# Patient Record
Sex: Female | Born: 1935 | Race: White | Hispanic: No | Marital: Married | Smoking: Never smoker
Health system: Southern US, Community
[De-identification: ages and names within clinical notes are randomized; demographics above are authoritative.]

## PROBLEM LIST (undated history)

## (undated) DIAGNOSIS — E669 Obesity, unspecified: Secondary | ICD-10-CM

## (undated) DIAGNOSIS — N200 Calculus of kidney: Secondary | ICD-10-CM

## (undated) DIAGNOSIS — E785 Hyperlipidemia, unspecified: Secondary | ICD-10-CM

## (undated) DIAGNOSIS — K5790 Diverticulosis of intestine, part unspecified, without perforation or abscess without bleeding: Secondary | ICD-10-CM

## (undated) DIAGNOSIS — I2510 Atherosclerotic heart disease of native coronary artery without angina pectoris: Secondary | ICD-10-CM

## (undated) DIAGNOSIS — K439 Ventral hernia without obstruction or gangrene: Secondary | ICD-10-CM

## (undated) DIAGNOSIS — K635 Polyp of colon: Secondary | ICD-10-CM

## (undated) DIAGNOSIS — H5440 Blindness, one eye, unspecified eye: Secondary | ICD-10-CM

## (undated) DIAGNOSIS — K279 Peptic ulcer, site unspecified, unspecified as acute or chronic, without hemorrhage or perforation: Secondary | ICD-10-CM

## (undated) DIAGNOSIS — E119 Type 2 diabetes mellitus without complications: Secondary | ICD-10-CM

## (undated) DIAGNOSIS — F0390 Unspecified dementia without behavioral disturbance: Secondary | ICD-10-CM

## (undated) DIAGNOSIS — N183 Chronic kidney disease, stage 3 unspecified: Secondary | ICD-10-CM

## (undated) DIAGNOSIS — I4819 Other persistent atrial fibrillation: Secondary | ICD-10-CM

## (undated) DIAGNOSIS — I77810 Thoracic aortic ectasia: Secondary | ICD-10-CM

## (undated) DIAGNOSIS — I10 Essential (primary) hypertension: Secondary | ICD-10-CM

## (undated) DIAGNOSIS — R296 Repeated falls: Secondary | ICD-10-CM

## (undated) DIAGNOSIS — I350 Nonrheumatic aortic (valve) stenosis: Secondary | ICD-10-CM

## (undated) HISTORY — DX: Blindness, one eye, unspecified eye: H54.40

## (undated) HISTORY — DX: Ventral hernia without obstruction or gangrene: K43.9

## (undated) HISTORY — DX: Peptic ulcer, site unspecified, unspecified as acute or chronic, without hemorrhage or perforation: K27.9

## (undated) HISTORY — DX: Hyperlipidemia, unspecified: E78.5

## (undated) HISTORY — PX: TONSILLECTOMY: SUR1361

## (undated) HISTORY — DX: Thoracic aortic ectasia: I77.810

## (undated) HISTORY — PX: APPENDECTOMY: SHX54

## (undated) HISTORY — DX: Diverticulosis of intestine, part unspecified, without perforation or abscess without bleeding: K57.90

## (undated) HISTORY — DX: Type 2 diabetes mellitus without complications: E11.9

## (undated) HISTORY — DX: Polyp of colon: K63.5

## (undated) HISTORY — DX: Repeated falls: R29.6

## (undated) HISTORY — DX: Atherosclerotic heart disease of native coronary artery without angina pectoris: I25.10

## (undated) HISTORY — DX: Essential (primary) hypertension: I10

## (undated) HISTORY — PX: TOTAL ABDOMINAL HYSTERECTOMY: SHX209

## (undated) HISTORY — DX: Calculus of kidney: N20.0

## (undated) HISTORY — DX: Obesity, unspecified: E66.9

---

## 1999-01-29 ENCOUNTER — Other Ambulatory Visit: Admission: RE | Admit: 1999-01-29 | Discharge: 1999-01-29 | Payer: Self-pay | Admitting: Gastroenterology

## 1999-06-07 ENCOUNTER — Other Ambulatory Visit: Admission: RE | Admit: 1999-06-07 | Discharge: 1999-06-07 | Payer: Self-pay | Admitting: Urology

## 1999-12-05 ENCOUNTER — Encounter: Payer: Self-pay | Admitting: Cardiology

## 1999-12-05 ENCOUNTER — Inpatient Hospital Stay (HOSPITAL_COMMUNITY): Admission: EM | Admit: 1999-12-05 | Discharge: 1999-12-07 | Payer: Self-pay | Admitting: Emergency Medicine

## 1999-12-06 ENCOUNTER — Encounter: Payer: Self-pay | Admitting: Cardiology

## 2000-07-01 ENCOUNTER — Encounter: Payer: Self-pay | Admitting: Orthopedic Surgery

## 2000-07-01 ENCOUNTER — Encounter: Admission: RE | Admit: 2000-07-01 | Discharge: 2000-07-01 | Payer: Self-pay | Admitting: Orthopedic Surgery

## 2000-07-17 ENCOUNTER — Ambulatory Visit (HOSPITAL_COMMUNITY): Admission: RE | Admit: 2000-07-17 | Discharge: 2000-07-17 | Payer: Self-pay

## 2001-08-07 ENCOUNTER — Emergency Department (HOSPITAL_COMMUNITY): Admission: EM | Admit: 2001-08-07 | Discharge: 2001-08-07 | Payer: Self-pay | Admitting: Emergency Medicine

## 2002-04-15 ENCOUNTER — Ambulatory Visit (HOSPITAL_COMMUNITY): Admission: RE | Admit: 2002-04-15 | Discharge: 2002-04-15 | Payer: Self-pay | Admitting: Cardiology

## 2003-02-10 ENCOUNTER — Ambulatory Visit (HOSPITAL_BASED_OUTPATIENT_CLINIC_OR_DEPARTMENT_OTHER): Admission: RE | Admit: 2003-02-10 | Discharge: 2003-02-10 | Payer: Self-pay | Admitting: Urology

## 2003-02-10 ENCOUNTER — Encounter (INDEPENDENT_AMBULATORY_CARE_PROVIDER_SITE_OTHER): Payer: Self-pay | Admitting: *Deleted

## 2003-07-07 ENCOUNTER — Emergency Department (HOSPITAL_COMMUNITY): Admission: EM | Admit: 2003-07-07 | Discharge: 2003-07-07 | Payer: Self-pay

## 2003-10-26 ENCOUNTER — Inpatient Hospital Stay (HOSPITAL_COMMUNITY): Admission: RE | Admit: 2003-10-26 | Discharge: 2003-10-30 | Payer: Self-pay | Admitting: Neurosurgery

## 2003-11-26 ENCOUNTER — Emergency Department (HOSPITAL_COMMUNITY): Admission: EM | Admit: 2003-11-26 | Discharge: 2003-11-26 | Payer: Self-pay | Admitting: Emergency Medicine

## 2003-11-27 ENCOUNTER — Ambulatory Visit: Admission: RE | Admit: 2003-11-27 | Discharge: 2003-11-27 | Payer: Self-pay | Admitting: Neurosurgery

## 2003-11-27 ENCOUNTER — Inpatient Hospital Stay (HOSPITAL_COMMUNITY): Admission: AD | Admit: 2003-11-27 | Discharge: 2003-11-30 | Payer: Self-pay | Admitting: Neurosurgery

## 2003-12-03 ENCOUNTER — Emergency Department (HOSPITAL_COMMUNITY): Admission: EM | Admit: 2003-12-03 | Discharge: 2003-12-03 | Payer: Self-pay | Admitting: Emergency Medicine

## 2004-07-16 ENCOUNTER — Ambulatory Visit (HOSPITAL_COMMUNITY): Admission: RE | Admit: 2004-07-16 | Discharge: 2004-07-16 | Payer: Self-pay | Admitting: Orthopedic Surgery

## 2004-08-02 IMAGING — CT CT ABDOMEN W/ CM
1 series · 14 of 32 positions shown, 18 images · IV contrast (omnipaque)
Comparison: none

CLINICAL DATA: 76-year-old female with right groin pain, bilateral lower extremity edema.  There is the concern also for ileofemoral DVT.  
CT ABDOMEN AND PELVIS WITH ORAL AND IV CONTRAST
TECHNIQUE: 5 mm collimation was utilized to scan the abdomen and pelvis extending through the proximal lower extremities following the administration 100 cc Omnipaque 300 contrast.  
No comparisons.  
CT ABDOMEN WITH CONTRAST
Minor left lower lobe atelectasis.  No pericardial or pleural fluid.  Degenerative osteophytes are evident of the thoracic spine.  In the abdomen there is a punctate calculus in the liver involving the medial segment left hepatic lobe, image 20 most consistent with granulomatous disease.  No other hepatic abnormality.  No intrahepatic biliary dilatation.  The gallbladder is moderately distended.  The bile ducts are normal.  The pancreas is thin and atrophic with fatty replacement related to aging.  The spleen has a punctate calculus as well from granulomatous disease in the inferior aspect.  Additionally within the spleen there is ill-defined low attenuation extending from the splenic hilum which is nonspecific in appearance and may represent ill-defined splenic cyst formation.    Ultrasound of the spleen may be helpful.  The adrenal glands, stomach, esophagus, and small bowel are unremarkable.  No evidence of bowel destruction, lymphadenopathy, free air, or ascites.  The kidneys demonstrate mild cortical atrophy with central sinus lipomatosis bilaterally.  No hydronephrosis.  Postoperative changes are present of the lower lumbar spine.  The opacified IVC demonstrates no evidence of intraluminal filling defect or thrombus.
IMPRESSION
Left lower lobe atelectasis.  
Hepatic and splenic granulomata.  
Atrophic pancreas.  
Ill-defined splenic hilar low density abnormalities of uncertain etiology.  Ultrasound may be helpful.  
Patent IVC without evidence of intraluminal clot.
CT PELVIS WITH CONTRAST
Continuing into the pelvis, the visualized ileofemoral venous system bilaterally demonstrates enhancement without evidence of DVT.  No ascites, bowel obstruction, or lymphadenopathy.  The patient has had a hysterectomy.  Sigmoid diverticulosis is evident without inflammation.  Midline staples are noted in the rectus musculature.  Postoperative changes are present of the spine from recent lumbar surgery.
No evidence of ileofemoral DVT by CT.
Sigmoid diverticulosis.  
Status-post hysterectomy.
Postoperative changes.

[Series 2: routine abdomen · axial · 0.87mm/px · z∈[-659,-69]mm · 14 of 132 slices shown, 18 images]
[im 9/132  soft-tissue]
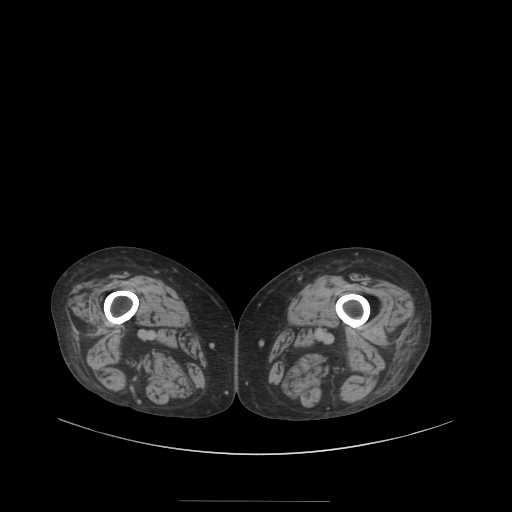
[im 9/132  bone]
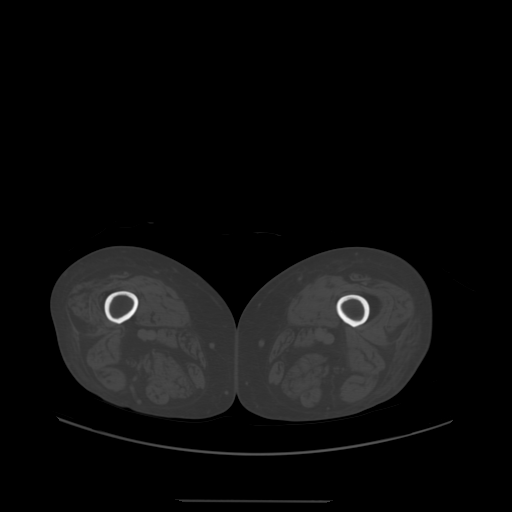
[im 17/132  soft-tissue]
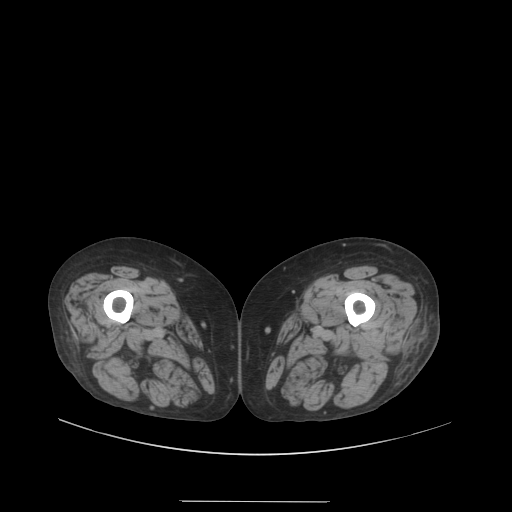
[im 30/132  soft-tissue]
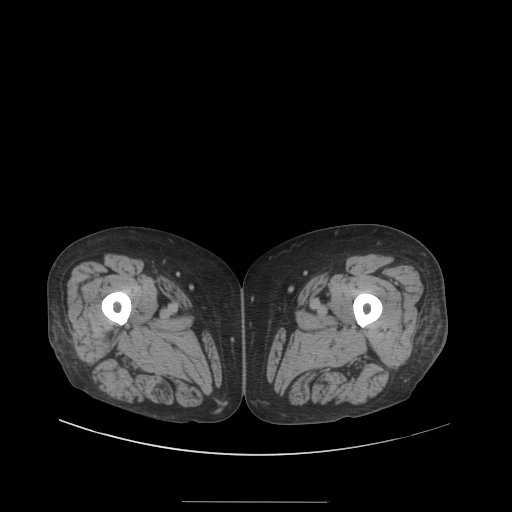
[im 39/132  soft-tissue]
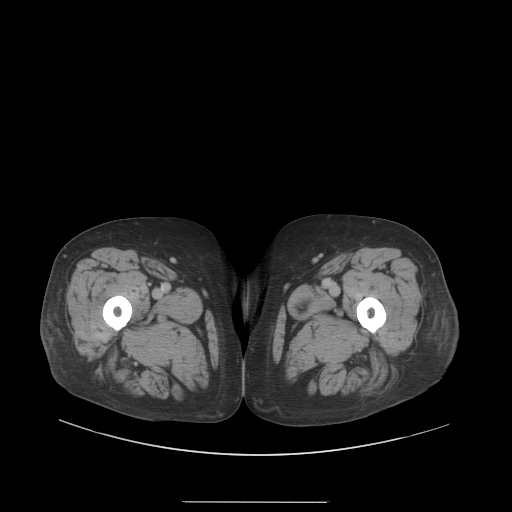
[im 51/132  soft-tissue]
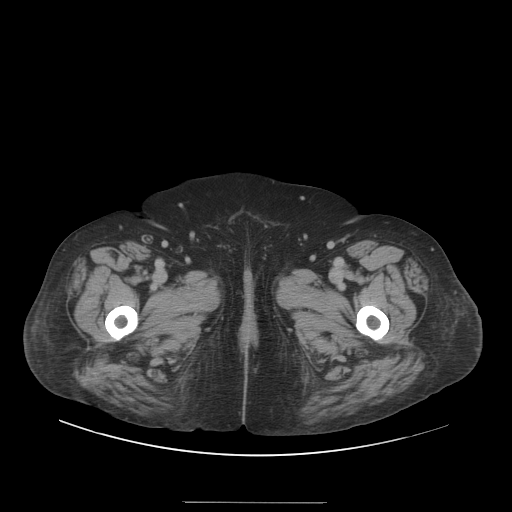
[im 60/132  soft-tissue]
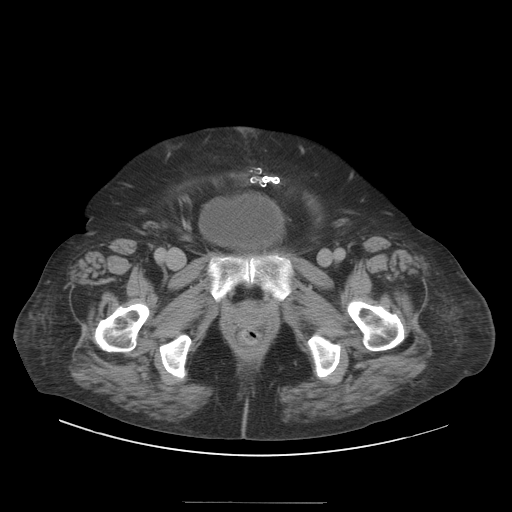
[im 72/132  soft-tissue]
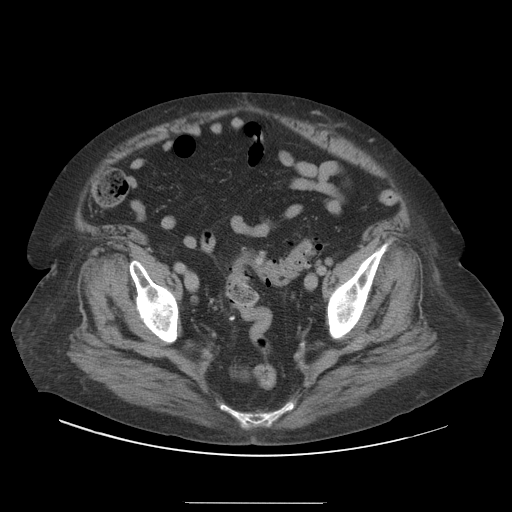
[im 81/132  soft-tissue]
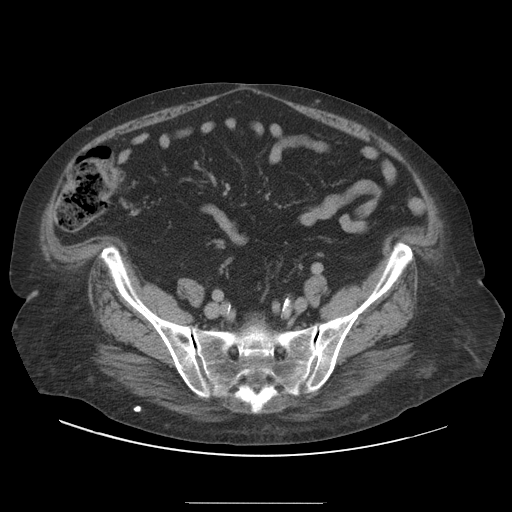
[im 93/132  soft-tissue]
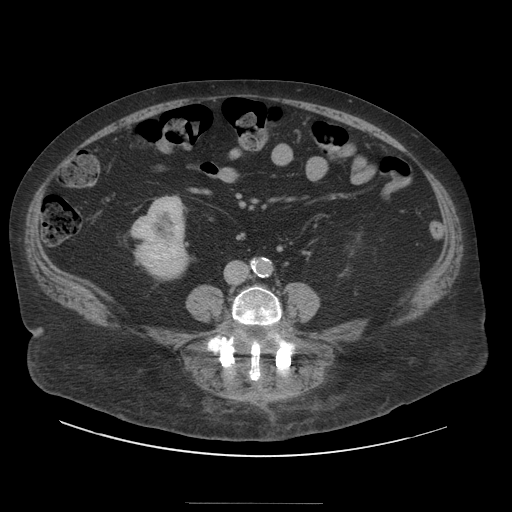
[im 93/132  bone]
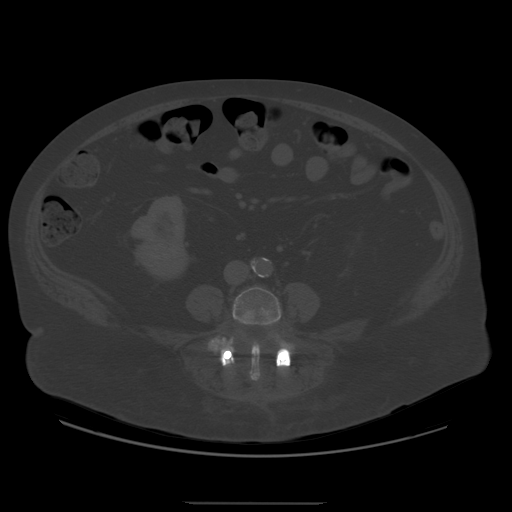
[im 102/132  soft-tissue]
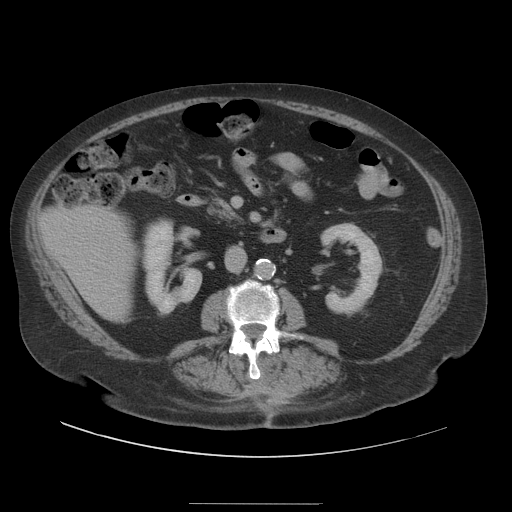
[im 115/132  soft-tissue]
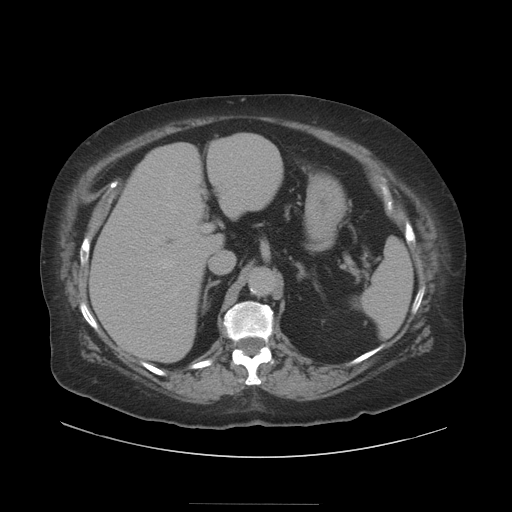
[im 115/132  lung]
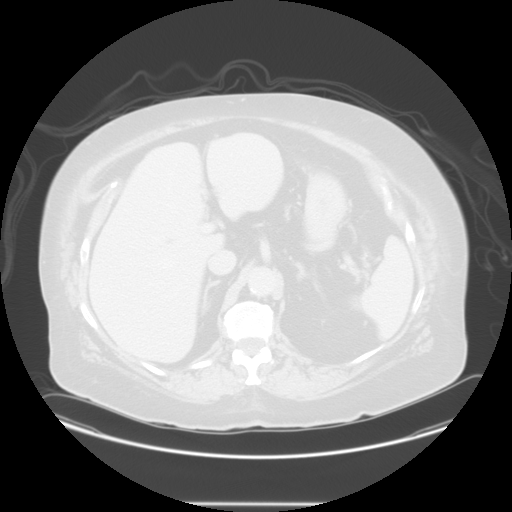
[im 119/132  lung]
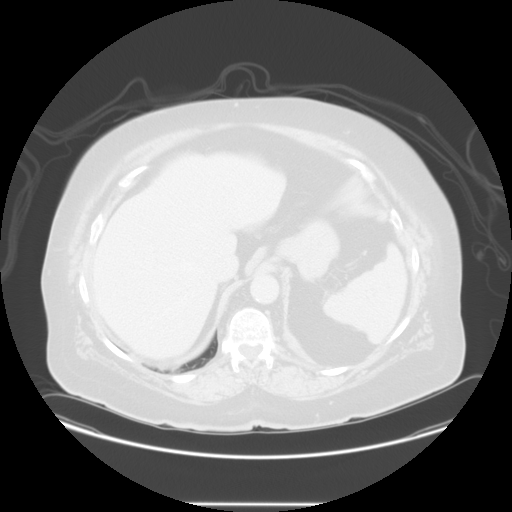
[im 123/132  soft-tissue]
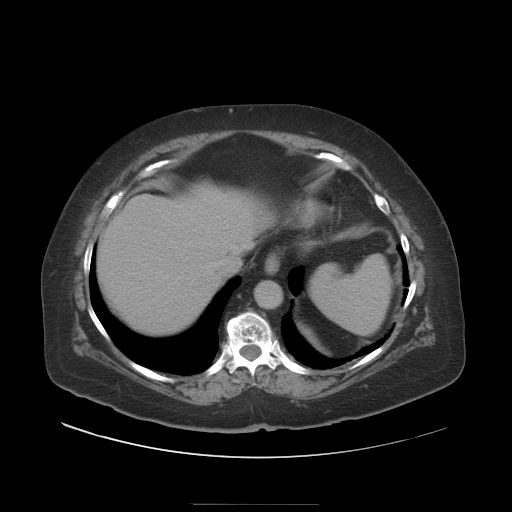
[im 123/132  lung]
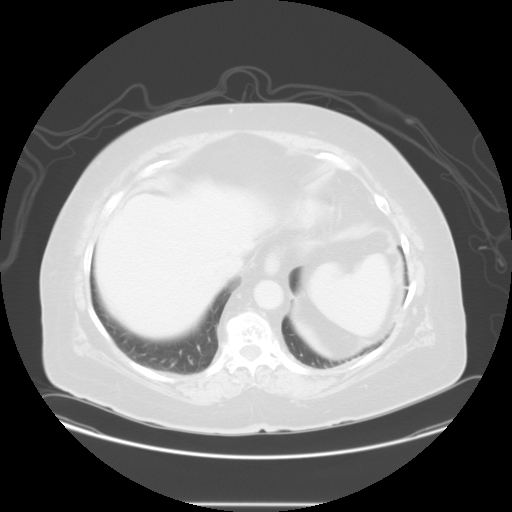
[im 127/132  lung]
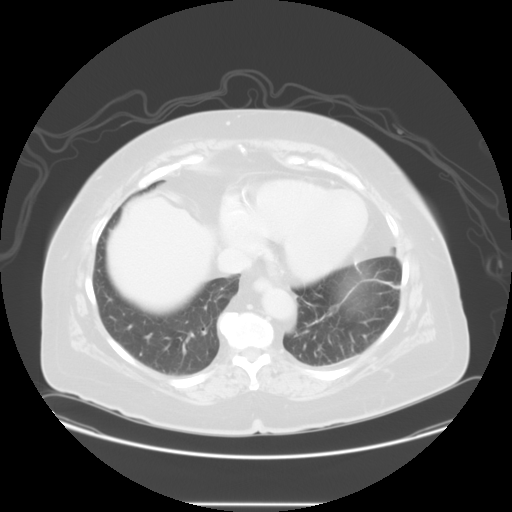

[14 of 32 positions shown; findings below may reference images not displayed]

## 2004-10-18 ENCOUNTER — Ambulatory Visit: Payer: Self-pay | Admitting: Gastroenterology

## 2004-10-20 ENCOUNTER — Emergency Department (HOSPITAL_COMMUNITY): Admission: EM | Admit: 2004-10-20 | Discharge: 2004-10-20 | Payer: Self-pay | Admitting: *Deleted

## 2007-03-18 ENCOUNTER — Emergency Department (HOSPITAL_COMMUNITY): Admission: EM | Admit: 2007-03-18 | Discharge: 2007-03-18 | Payer: Self-pay | Admitting: Emergency Medicine

## 2007-05-12 ENCOUNTER — Ambulatory Visit (HOSPITAL_COMMUNITY): Admission: RE | Admit: 2007-05-12 | Discharge: 2007-05-12 | Payer: Self-pay | Admitting: Urology

## 2007-09-30 ENCOUNTER — Ambulatory Visit: Payer: Self-pay | Admitting: Cardiology

## 2007-10-11 ENCOUNTER — Encounter: Payer: Self-pay | Admitting: Cardiology

## 2007-10-11 ENCOUNTER — Ambulatory Visit: Payer: Self-pay

## 2007-10-22 ENCOUNTER — Ambulatory Visit: Payer: Self-pay | Admitting: Cardiology

## 2007-11-02 ENCOUNTER — Ambulatory Visit: Payer: Self-pay

## 2007-11-02 ENCOUNTER — Encounter: Payer: Self-pay | Admitting: Cardiology

## 2007-11-05 ENCOUNTER — Emergency Department (HOSPITAL_COMMUNITY): Admission: EM | Admit: 2007-11-05 | Discharge: 2007-11-05 | Payer: Self-pay | Admitting: Emergency Medicine

## 2007-11-22 ENCOUNTER — Ambulatory Visit: Payer: Self-pay | Admitting: Gastroenterology

## 2007-12-14 ENCOUNTER — Ambulatory Visit: Payer: Self-pay | Admitting: Gastroenterology

## 2007-12-29 ENCOUNTER — Ambulatory Visit: Payer: Self-pay | Admitting: Cardiology

## 2008-01-27 ENCOUNTER — Ambulatory Visit: Payer: Self-pay | Admitting: Cardiology

## 2008-07-11 ENCOUNTER — Ambulatory Visit: Payer: Self-pay | Admitting: Cardiology

## 2008-09-13 ENCOUNTER — Inpatient Hospital Stay (HOSPITAL_COMMUNITY): Admission: RE | Admit: 2008-09-13 | Discharge: 2008-09-18 | Payer: Self-pay | Admitting: Orthopedic Surgery

## 2008-10-24 ENCOUNTER — Ambulatory Visit: Admission: RE | Admit: 2008-10-24 | Discharge: 2008-10-24 | Payer: Self-pay | Admitting: Orthopedic Surgery

## 2008-10-24 ENCOUNTER — Encounter (INDEPENDENT_AMBULATORY_CARE_PROVIDER_SITE_OTHER): Payer: Self-pay | Admitting: Orthopedic Surgery

## 2008-10-24 ENCOUNTER — Ambulatory Visit: Payer: Self-pay | Admitting: Surgery

## 2009-07-11 ENCOUNTER — Encounter: Payer: Self-pay | Admitting: Cardiology

## 2009-07-12 ENCOUNTER — Ambulatory Visit: Payer: Self-pay | Admitting: Cardiology

## 2009-09-21 ENCOUNTER — Encounter: Payer: Self-pay | Admitting: Cardiology

## 2010-01-22 ENCOUNTER — Encounter: Admission: RE | Admit: 2010-01-22 | Discharge: 2010-01-22 | Payer: Self-pay | Admitting: *Deleted

## 2010-01-26 ENCOUNTER — Encounter: Admission: RE | Admit: 2010-01-26 | Discharge: 2010-01-26 | Payer: Self-pay | Admitting: Family Medicine

## 2010-02-07 ENCOUNTER — Ambulatory Visit (HOSPITAL_COMMUNITY): Admission: RE | Admit: 2010-02-07 | Discharge: 2010-02-08 | Payer: Self-pay | Admitting: Neurosurgery

## 2010-04-21 ENCOUNTER — Inpatient Hospital Stay (HOSPITAL_COMMUNITY): Admission: EM | Admit: 2010-04-21 | Discharge: 2010-04-25 | Payer: Self-pay | Admitting: Emergency Medicine

## 2010-04-21 ENCOUNTER — Ambulatory Visit: Payer: Self-pay | Admitting: Internal Medicine

## 2010-04-22 ENCOUNTER — Encounter (INDEPENDENT_AMBULATORY_CARE_PROVIDER_SITE_OTHER): Payer: Self-pay | Admitting: Internal Medicine

## 2010-09-22 ENCOUNTER — Encounter: Payer: Self-pay | Admitting: Surgery

## 2010-09-24 ENCOUNTER — Encounter: Payer: Self-pay | Admitting: Cardiology

## 2010-09-24 ENCOUNTER — Ambulatory Visit
Admission: RE | Admit: 2010-09-24 | Discharge: 2010-09-24 | Payer: Self-pay | Source: Home / Self Care | Attending: Cardiology | Admitting: Cardiology

## 2010-10-03 NOTE — Assessment & Plan Note (Signed)
Summary: per check out/sf  Medications Added METFORMIN HCL 500 MG TABS (METFORMIN HCL) Take 1 tablet by mouth two times a day ALPRAZOLAM 1 MG TABS (ALPRAZOLAM) 1 tabs at bedtime HUMULIN 70/30 70-30 % SUSP (INSULIN ISOPHANE & REGULAR) two times a day DONEPEZIL HCL 10 MG TABS (DONEPEZIL HCL) 1/2 tab daily CITALOPRAM HYDROBROMIDE 20 MG TABS (CITALOPRAM HYDROBROMIDE) once daily DOCUSATE SODIUM 100 MG CAPS (DOCUSATE SODIUM) once daily      Allergies Added:   Visit Type:  Follow-up Primary Provider:   Jamey Reas, MD  CC:  atrial fibrillation and aortic stenosis.  History of Present Illness: The patient is seen for cardiology followup.  She has had paroxysmal fibrillation in the past.  She has refused Coumadin because of her eye problems.  She has normal LV function.  She does have coronary disease has been stable. she was having some falls.  She hurt her left wrist which is currently braced.  This problem is improved.  She's not having palpitations.  She's not having chest pain or shortness of breath.  Current Medications (verified): 1)  Metformin Hcl 500 Mg Tabs (Metformin Hcl) .... Take 1 Tablet By Mouth Two Times A Day 2)  Lisinopril-Hydrochlorothiazide 20-25 Mg Tabs (Lisinopril-Hydrochlorothiazide) .... Take One Tablet By Mouth Once Daily. 3)  Vitamin D (Ergocalciferol) 50000 Unit Caps (Ergocalciferol) .... Weekly 4)  Alprazolam 1 Mg Tabs (Alprazolam) .Marland Kitchen.. 1 Tabs At Bedtime 5)  Humulin 70/30 70-30 % Susp (Insulin Isophane & Regular) .... Two Times A Day 6)  Hydrocodone-Acetaminophen 5-500 Mg Tabs (Hydrocodone-Acetaminophen) .... As Needed 7)  Aspirin 81 Mg Tbec (Aspirin) .... Take One Tablet By Mouth Daily 8)  Donepezil Hcl 10 Mg Tabs (Donepezil Hcl) .... 1/2 Tab Daily 9)  Citalopram Hydrobromide 20 Mg Tabs (Citalopram Hydrobromide) .... Once Daily 10)  Docusate Sodium 100 Mg Caps (Docusate Sodium) .... Once Daily  Allergies (verified): 1)  ! * Lorcet  Past  History:  Past Medical History: FH Colon CA Diabetes CAD  stents to right coronary artery 2001  /   Myoview.. march, 2009.. no ischemia... EF 70% Atrial Fibrillation.. Coumadin is not used because of her eyes Hyperrtension Diverticulosis Colon Polyps Ventral hernia LV function.. normal.. echo.. February, 2009 Aortic stenosis   mild... echo... February, 2009 Ascending aorta dilatation... mild... echo... February, 2009 Asthma obesity... 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 not taken by choice of the patient because of her eyes Following injuries   2011 / improved 2012  Review of Systems       Patient denies fever, chills, headache, sweats, rash, change in vision, change in hearing, chest pain, cough, nausea vomiting, urinary symptoms.  All the systems are reviewed and are negative.  Vital Signs:  Patient profile:   77 year old female Height:      61 inches Weight:      186 pounds BMI:     35.27 Pulse rate:   54 / minute BP sitting:   114 / 68  (left arm) Cuff size:   regular  Vitals Entered By: Hardin Negus, RMA (September 24, 2010 10:59 AM)  Physical Exam  General:  patient is overweight but stable. Head:  head is atraumatic. Eyes:  no xanthelasma. Neck:  no jugular venous distention. Chest Wall:  no chest wall tenderness. Lungs:  lungs are clear.  Respiratory effort is nonlabored. Heart:  cardiac exam reveals S1-S2.  There is a 3/6 crescendo decrescendo systolic murmur. Abdomen:  abdomen is soft Msk:  patient is wearing a brace on her left wrist. Extremities:  no peripheral edema. Skin:  no skin rashes. Psych:  patient is oriented to person time and place.  Affect is normal.   Impression & Recommendations:  Problem # 1:  * FALLING EPISODES The patient was having falling episodes in the fall of 2011.  This was not related to her aortic stenosis.  It was not cardiac in origin.  No further  workup.  Problem # 2:  CAD (ICD-414.00) Coronary disease is stable.  She's not having chest pain.  She had a Myoview in 2009 revealing no ischemia.  There is normal LV function.  EKG is done today and reviewed by me.  They're sinus rhythm and no significant change.  No further workup.  Problem # 3:  HYPERTENSION (ICD-401.9) Blood pressure is controlled.  No change in therapy.  Problem # 4:  AORTIC STENOSIS (ICD-424.1) The patient has aortic stenosis.  It was mild several years ago.  When I see her next year it would be time to reassess the aortic stenosis.  Problem # 5:  * AORTIC ROOT DILATATION When we do a followup 2-D echo next year to assess her aortic valve we will reassess her aortic root.  Problem # 6:  DYSLIPIDEMIA (ICD-272.4)  The following medications were removed from the medication list:    Crestor 10 Mg Tabs (Rosuvastatin calcium) .Marland Kitchen... Take one tablet by mouth daily. The patient's lipids are treated.  Problem # 7:  ATRIAL FIBRILLATION, HX OF (ICD-V12.59) She is not having any clinical atrial fibrillation.  EKG is done today and shows sinus rhythm.  She has refused Coumadin in the past because of her eyes.  She is on aspirin.  Plan  cardiology follow up in one year.  Other Orders: EKG w/ Interpretation (93000)  Patient Instructions: 1)  Your physician wants you to follow-up in: 1 year.  You will receive a reminder letter in the mail two months in advance. If you don't receive a letter, please call our office to schedule the follow-up appointment.

## 2010-11-14 LAB — COMPREHENSIVE METABOLIC PANEL
ALT: 10 U/L (ref 0–35)
AST: 17 U/L (ref 0–37)
Albumin: 2.9 g/dL — ABNORMAL LOW (ref 3.5–5.2)
Alkaline Phosphatase: 61 U/L (ref 39–117)
Calcium: 9.6 mg/dL (ref 8.4–10.5)
Chloride: 103 mEq/L (ref 96–112)
GFR calc Af Amer: >60 mL/min (ref >60)
Glucose, Bld: 82 mg/dL (ref 70–99)
Potassium: 3.9 mEq/L (ref 3.5–5.1)
Sodium: 140 mEq/L (ref 135–145)
Sodium: 141 mEq/L (ref 135–145)
Total Bilirubin: 0.5 mg/dL (ref 0.3–1.2)
Total Protein: 5.9 g/dL — ABNORMAL LOW (ref 6.0–8.3)

## 2010-11-14 LAB — GLUCOSE, CAPILLARY
Glucose-Capillary: 105 mg/dL — ABNORMAL HIGH (ref 70–99)
Glucose-Capillary: 116 mg/dL — ABNORMAL HIGH (ref 70–99)
Glucose-Capillary: 122 mg/dL — ABNORMAL HIGH (ref 70–99)
Glucose-Capillary: 130 mg/dL — ABNORMAL HIGH (ref 70–99)
Glucose-Capillary: 130 mg/dL — ABNORMAL HIGH (ref 70–99)
Glucose-Capillary: 130 mg/dL — ABNORMAL HIGH (ref 70–99)
Glucose-Capillary: 130 mg/dL — ABNORMAL HIGH (ref 70–99)
Glucose-Capillary: 130 mg/dL — ABNORMAL HIGH (ref 70–99)
Glucose-Capillary: 139 mg/dL — ABNORMAL HIGH (ref 70–99)
Glucose-Capillary: 159 mg/dL — ABNORMAL HIGH (ref 70–99)
Glucose-Capillary: 162 mg/dL — ABNORMAL HIGH (ref 70–99)
Glucose-Capillary: 191 mg/dL — ABNORMAL HIGH (ref 70–99)
Glucose-Capillary: 81 mg/dL (ref 70–99)
Glucose-Capillary: 85 mg/dL (ref 70–99)
Glucose-Capillary: 95 mg/dL (ref 70–99)

## 2010-11-14 LAB — BASIC METABOLIC PANEL
BUN: 19 mg/dL (ref 6–23)
Calcium: 8.8 mg/dL (ref 8.4–10.5)
Calcium: 9.1 mg/dL (ref 8.4–10.5)
Chloride: 102 mEq/L (ref 96–112)
GFR calc Af Amer: >60 mL/min (ref >60)
GFR calc non Af Amer: 55 mL/min — ABNORMAL LOW (ref >60)
GFR calc non Af Amer: 59 mL/min — ABNORMAL LOW (ref >60)
GFR calc non Af Amer: >60 mL/min (ref >60)
Glucose, Bld: 116 mg/dL — ABNORMAL HIGH (ref 70–99)
Glucose, Bld: 118 mg/dL — ABNORMAL HIGH (ref 70–99)
Glucose, Bld: 171 mg/dL — ABNORMAL HIGH (ref 70–99)
Potassium: 3.7 mEq/L (ref 3.5–5.1)
Potassium: 3.7 mEq/L (ref 3.5–5.1)
Potassium: 3.8 mEq/L (ref 3.5–5.1)
Sodium: 137 mEq/L (ref 135–145)
Sodium: 139 mEq/L (ref 135–145)

## 2010-11-14 LAB — CBC
HCT: 37 % (ref 36.0–46.0)
HCT: 38.7 % (ref 36.0–46.0)
HCT: 38.7 % (ref 36.0–46.0)
Hemoglobin: 11.8 g/dL — ABNORMAL LOW (ref 12.0–15.0)
Hemoglobin: 12.3 g/dL (ref 12.0–15.0)
Hemoglobin: 12.4 g/dL (ref 12.0–15.0)
MCHC: 32 g/dL (ref 30.0–36.0)
MCHC: 32.1 g/dL (ref 30.0–36.0)
MCHC: 32.3 g/dL (ref 30.0–36.0)
MCV: 83.9 fL (ref 78.0–100.0)
Platelets: 249 10*3/uL (ref 150–400)
Platelets: 260 10*3/uL (ref 150–400)
RBC: 4.54 MIL/uL (ref 3.87–5.11)
RBC: 4.59 MIL/uL (ref 3.87–5.11)
RDW: 15.8 % — ABNORMAL HIGH (ref 11.5–15.5)
WBC: 6.7 10*3/uL (ref 4.0–10.5)
WBC: 6.9 10*3/uL (ref 4.0–10.5)
WBC: 8.6 10*3/uL (ref 4.0–10.5)
WBC: 8.7 10*3/uL (ref 4.0–10.5)
WBC: 8.8 10*3/uL (ref 4.0–10.5)

## 2010-11-14 LAB — DIFFERENTIAL
Basophils Absolute: 0.1 10*3/uL (ref 0.0–0.1)
Basophils Relative: 1 % (ref 0–1)
Eosinophils Absolute: 0.3 10*3/uL (ref 0.0–0.7)
Eosinophils Relative: 3 % (ref 0–5)
Lymphs Abs: 2.9 10*3/uL (ref 0.7–4.0)
Monocytes Absolute: 0.8 10*3/uL (ref 0.1–1.0)
Monocytes Relative: 9 % (ref 3–12)
Monocytes Relative: 9 % (ref 3–12)
Neutro Abs: 4.6 10*3/uL (ref 1.7–7.7)
Neutrophils Relative %: 53 % (ref 43–77)

## 2010-11-14 LAB — URINALYSIS, ROUTINE W REFLEX MICROSCOPIC
Nitrite: NEGATIVE
Specific Gravity, Urine: 1.016 (ref 1.005–1.030)
pH: 7.5 (ref 5.0–8.0)

## 2010-11-14 LAB — URINE CULTURE

## 2010-11-14 LAB — LIPID PANEL
LDL Cholesterol: 123 mg/dL — ABNORMAL HIGH (ref 0–99)
Triglycerides: 176 mg/dL — ABNORMAL HIGH (ref <150)

## 2010-11-14 LAB — MAGNESIUM: Magnesium: 2.3 mg/dL (ref 1.5–2.5)

## 2010-11-14 LAB — PHOSPHORUS: Phosphorus: 3.8 mg/dL (ref 2.3–4.6)

## 2010-11-14 LAB — CARDIAC PANEL(CRET KIN+CKTOT+MB+TROPI)
CK, MB: 1.6 ng/mL (ref 0.3–4.0)
Relative Index: INVALID (ref 0.0–2.5)
Total CK: 52 U/L (ref 7–177)
Total CK: 57 U/L (ref 7–177)
Troponin I: 0.03 ng/mL (ref 0.00–0.06)

## 2010-11-14 LAB — CK TOTAL AND CKMB (NOT AT ARMC)
Relative Index: INVALID (ref 0.0–2.5)
Total CK: 45 U/L (ref 7–177)

## 2010-11-14 LAB — URINE MICROSCOPIC-ADD ON

## 2010-11-14 LAB — PROTIME-INR: INR: 0.93 (ref 0.00–1.49)

## 2010-11-14 LAB — TSH: TSH: 5.818 u[IU]/mL — ABNORMAL HIGH (ref 0.350–4.500)

## 2010-11-14 LAB — APTT: aPTT: 31 seconds (ref 24–37)

## 2010-11-18 LAB — GLUCOSE, CAPILLARY
Glucose-Capillary: 130 mg/dL — ABNORMAL HIGH (ref 70–99)
Glucose-Capillary: 276 mg/dL — ABNORMAL HIGH (ref 70–99)
Glucose-Capillary: 279 mg/dL — ABNORMAL HIGH (ref 70–99)

## 2010-11-18 LAB — CBC
HCT: 38.8 % (ref 36.0–46.0)
Hemoglobin: 12.8 g/dL (ref 12.0–15.0)
MCHC: 33.1 g/dL (ref 30.0–36.0)
MCV: 81.9 fL (ref 78.0–100.0)
RDW: 17 % — ABNORMAL HIGH (ref 11.5–15.5)

## 2010-11-18 LAB — COMPREHENSIVE METABOLIC PANEL
Alkaline Phosphatase: 83 U/L (ref 39–117)
BUN: 22 mg/dL (ref 6–23)
Calcium: 9.6 mg/dL (ref 8.4–10.5)
Creatinine, Ser: 0.92 mg/dL (ref 0.4–1.2)
Glucose, Bld: 173 mg/dL — ABNORMAL HIGH (ref 70–99)
Total Protein: 6.6 g/dL (ref 6.0–8.3)

## 2010-11-18 LAB — SURGICAL PCR SCREEN
MRSA, PCR: NEGATIVE
Staphylococcus aureus: NEGATIVE

## 2010-12-16 LAB — URINALYSIS, ROUTINE W REFLEX MICROSCOPIC
Bilirubin Urine: NEGATIVE
Specific Gravity, Urine: 1.022 (ref 1.005–1.030)
Urobilinogen, UA: 0.2 mg/dL (ref 0.0–1.0)
pH: 5.5 (ref 5.0–8.0)

## 2010-12-16 LAB — GLUCOSE, CAPILLARY
Glucose-Capillary: 101 mg/dL — ABNORMAL HIGH (ref 70–99)
Glucose-Capillary: 128 mg/dL — ABNORMAL HIGH (ref 70–99)
Glucose-Capillary: 154 mg/dL — ABNORMAL HIGH (ref 70–99)
Glucose-Capillary: 162 mg/dL — ABNORMAL HIGH (ref 70–99)
Glucose-Capillary: 190 mg/dL — ABNORMAL HIGH (ref 70–99)
Glucose-Capillary: 50 mg/dL — ABNORMAL LOW (ref 70–99)
Glucose-Capillary: 89 mg/dL (ref 70–99)
Glucose-Capillary: 90 mg/dL (ref 70–99)
Glucose-Capillary: 106 mg/dL — ABNORMAL HIGH (ref 70–99)
Glucose-Capillary: 115 mg/dL — ABNORMAL HIGH (ref 70–99)
Glucose-Capillary: 125 mg/dL — ABNORMAL HIGH (ref 70–99)
Glucose-Capillary: 129 mg/dL — ABNORMAL HIGH (ref 70–99)
Glucose-Capillary: 132 mg/dL — ABNORMAL HIGH (ref 70–99)
Glucose-Capillary: 183 mg/dL — ABNORMAL HIGH (ref 70–99)
Glucose-Capillary: 226 mg/dL — ABNORMAL HIGH (ref 70–99)
Glucose-Capillary: 26 mg/dL — CL (ref 70–99)
Glucose-Capillary: 301 mg/dL — ABNORMAL HIGH (ref 70–99)
Glucose-Capillary: 38 mg/dL — CL (ref 70–99)
Glucose-Capillary: 49 mg/dL — ABNORMAL LOW (ref 70–99)
Glucose-Capillary: 91 mg/dL (ref 70–99)
Glucose-Capillary: 95 mg/dL (ref 70–99)

## 2010-12-16 LAB — BASIC METABOLIC PANEL
BUN: 34 mg/dL — ABNORMAL HIGH (ref 6–23)
BUN: 35 mg/dL — ABNORMAL HIGH (ref 6–23)
BUN: 19 mg/dL (ref 6–23)
BUN: 22 mg/dL (ref 6–23)
CO2: 26 mEq/L (ref 19–32)
CO2: 30 mEq/L (ref 19–32)
Calcium: 8.8 mg/dL (ref 8.4–10.5)
Calcium: 9.1 mg/dL (ref 8.4–10.5)
Chloride: 96 mEq/L (ref 96–112)
Chloride: 98 mEq/L (ref 96–112)
Creatinine, Ser: 2.12 mg/dL — ABNORMAL HIGH (ref 0.4–1.2)
Creatinine, Ser: 1.07 mg/dL (ref 0.4–1.2)
Creatinine, Ser: 1.14 mg/dL (ref 0.4–1.2)
Creatinine, Ser: 1.15 mg/dL (ref 0.4–1.2)
GFR calc Af Amer: 55 mL/min — ABNORMAL LOW (ref >60)
GFR calc Af Amer: 56 mL/min — ABNORMAL LOW (ref >60)
GFR calc Af Amer: 60 mL/min — ABNORMAL LOW (ref >60)
GFR calc non Af Amer: 22 mL/min — ABNORMAL LOW (ref >60)
GFR calc non Af Amer: 26 mL/min — ABNORMAL LOW (ref >60)
GFR calc non Af Amer: 46 mL/min — ABNORMAL LOW (ref >60)
GFR calc non Af Amer: 46 mL/min — ABNORMAL LOW (ref >60)
Glucose, Bld: 242 mg/dL — ABNORMAL HIGH (ref 70–99)
Glucose, Bld: 123 mg/dL — ABNORMAL HIGH (ref 70–99)
Potassium: 4.2 mEq/L (ref 3.5–5.1)
Potassium: 3.9 mEq/L (ref 3.5–5.1)

## 2010-12-16 LAB — COMPREHENSIVE METABOLIC PANEL
ALT: 10 U/L (ref 0–35)
Alkaline Phosphatase: 56 U/L (ref 39–117)
CO2: 29 mEq/L (ref 19–32)
Chloride: 99 mEq/L (ref 96–112)
GFR calc non Af Amer: 38 mL/min — ABNORMAL LOW (ref >60)
Glucose, Bld: 120 mg/dL — ABNORMAL HIGH (ref 70–99)
Potassium: 3.4 mEq/L — ABNORMAL LOW (ref 3.5–5.1)
Sodium: 138 mEq/L (ref 135–145)

## 2010-12-16 LAB — URINE MICROSCOPIC-ADD ON

## 2010-12-16 LAB — CBC
Hemoglobin: 12.8 g/dL (ref 12.0–15.0)
MCHC: 33.2 g/dL (ref 30.0–36.0)
MCV: 81.3 fL (ref 78.0–100.0)
Platelets: 246 10*3/uL (ref 150–400)
Platelets: 269 10*3/uL (ref 150–400)
RBC: 4.8 MIL/uL (ref 3.87–5.11)
RBC: 3.66 MIL/uL — ABNORMAL LOW (ref 3.87–5.11)
RDW: 16.2 % — ABNORMAL HIGH (ref 11.5–15.5)
WBC: 6.8 10*3/uL (ref 4.0–10.5)

## 2010-12-16 LAB — DIFFERENTIAL
Eosinophils Absolute: 0.1 10*3/uL (ref 0.0–0.7)
Monocytes Absolute: 0.4 10*3/uL (ref 0.1–1.0)
Neutrophils Relative %: 64 % (ref 43–77)

## 2010-12-16 LAB — TYPE AND SCREEN: Antibody Screen: NEGATIVE

## 2010-12-16 LAB — PROTIME-INR
INR: 1.9 — ABNORMAL HIGH (ref 0.00–1.49)
INR: 2.2 — ABNORMAL HIGH (ref 0.00–1.49)
Prothrombin Time: 12.8 seconds (ref 11.6–15.2)
Prothrombin Time: 22.9 seconds — ABNORMAL HIGH (ref 11.6–15.2)

## 2010-12-16 LAB — HEMOGLOBIN AND HEMATOCRIT, BLOOD
HCT: 31.3 % — ABNORMAL LOW (ref 36.0–46.0)
HCT: 31.5 % — ABNORMAL LOW (ref 36.0–46.0)
HCT: 30.3 % — ABNORMAL LOW (ref 36.0–46.0)
Hemoglobin: 10.3 g/dL — ABNORMAL LOW (ref 12.0–15.0)
Hemoglobin: 10.7 g/dL — ABNORMAL LOW (ref 12.0–15.0)
Hemoglobin: 10 g/dL — ABNORMAL LOW (ref 12.0–15.0)

## 2010-12-16 LAB — URINE CULTURE
Colony Count: NO GROWTH
Culture: NO GROWTH

## 2010-12-16 LAB — MAGNESIUM: Magnesium: 1.8 mg/dL (ref 1.5–2.5)

## 2011-01-14 NOTE — Assessment & Plan Note (Signed)
[facility]                            CARDIOLOGY OFFICE NOTE   NAME:Cassidy [name], Cassidy [name]                      MRN:          [id number]  DATE:07/11/2008                            DOB:          05-17-1927    HISTORY OF PRESENT ILLNESS:  Cassidy [name] is doing well.  She has atrial  fibrillation.  She has hypertension that is controlled.  She has mild  coronary artery disease.  She has mild aortic stenosis.  We do not need  to do an echo at this time.  She has not had any chest pain.  There has  been no syncope or presyncope.   PAST MEDICAL HISTORY:   ALLERGIES:  CODEINE.   MEDICATIONS:  See the flow sheet.   REVIEW OF SYSTEMS:  She has no GI or GU symptoms.  She is not having any  fevers or chills, headaches, or skin rashes.  Otherwise, her review of  systems is negative.   PHYSICAL EXAMINATION:  VITAL SIGNS:  Blood pressure is 110/51 with a  pulse of 57.  GENERAL:  The patient is oriented to person, time, and place.  Affect is  normal.  HEENT:  Reveals no xanthelasma.  She has normal extraocular motion.  NECK:  There are no carotid bruits.  There is no jugular venous  distention.  LUNGS:  Clear.  Respiratory effort is not labored.  CARDIAC:  Reveals that her rhythm is irregularly irregular.  She has a  2/6 systolic murmur, compatible with very aortic valve disease.  ABDOMEN:  Obese, but soft.  EXTREMITIES:  She has no peripheral edema.   PROBLEMS:  Listed on the note of December 29, 2007.  #1.  Hypertension, controlled.  #11.  Coronary artery disease, stable.  #12.  Mild aortic stenosis, stable.   Cassidy [name] is stable.  She does not need any test.  I will see her  back in a year.     Luis Abed, MD, [facility]  Electronically Signed    JDK/MedQ  DD: 07/11/2008  DT: 07/12/2008  Job #: [id number]   cc:   Ace Gins, MD  Beulah Gandy. Ashley Royalty, M.D.

## 2011-01-14 NOTE — Op Note (Signed)
Cassidy [name], Cassidy [name]               ACCOUNT NO.:  [id number]   MEDICAL RECORD NO.:  [id number]          PATIENT TYPE:  INP   LOCATION:                               FACILITY:  [facility]   PHYSICIAN:  Georges Lynch. Gioffre, M.D.DATE OF BIRTH:  July 16, 1927   DATE OF PROCEDURE:  09/13/2008  DATE OF DISCHARGE:                               OPERATIVE REPORT   CHIEF COMPLAINT:  Pain in her right knee.  She had a diagnosis of  degenerative arthritis of the right knee.   SURGEON:  Dr. [name].   ASSISTANT:  Arlyn Leak, PA.   PREOPERATIVE DIAGNOSIS:  Severe degenerative arthritis of the right  knee.   POSTOPERATIVE DIAGNOSIS:  Severe degenerative arthritis of the right  knee.   OPERATION:  Right total knee arthroplasty utilizing the DePuy system.  All 3 components were cemented.  Vancomycin was used in the cement.  The  sizes used were a size 2 right femur posterior stabilized type, the  patella was a size 32 with 3 pegs, the tibial tray was a size 2  cemented, the tibial insert was a size two, 15 mm in thickness.   PROCEDURE:  Under spinal anesthesia, routine orthopedic prepping and  draping of the right lower extremity was carried out.  The leg was  exsanguinated with an Esmarch.  A tourniquet was elevated at 375 mmHg.  At this time, the knee was flexed and an anterior approach to the knee  was carried out.  Two flaps were created.  I then went on and did a  median parapatellar incision and reflected the patella laterally, flexed  the knee, and did medial and lateral meniscectomies.  I excised the  anterior and posterior cruciate ligaments.  I thoroughly irrigated out  the knee.  The initial drill hole was made in the intercondylar notch  and then a #1 jig was inserted.  We removed 11 mm thickness off the  distal femur.  Next, a #2 jig was inserted.  We measured the femur to be  a size 2, right posterior stabilized type.  We then put our third jig on  and then cut our chamfer cuts and our AP  and posterior cuts as well.   Next, attention was directed to the tibia.  We removed 4 mm thickness  off the affected medial side of the tibia.  We measured the tibia to be  a size 2.  We did use the intramedullary guide.  Following that, we  thoroughly irrigated out the canal.  We then cut our keel cut out of the  tibial plateau.  Following that, we cut our notch cut out of the femur.  Once this was prepared, all trials were inserted and we felt that the 15-  mm thickness was the most stable.  We then resurfaced our patella in the  usual fashion for a 32 patella.  Three drill holes were made in the  articular surface of the patella.   We then removed all trial components.  I injected 30 cc of 0.25%  Marcaine with epinephrine and 30 mg of Toradol into  the soft tissue.  Following that, we thoroughly irrigated out the knee, dried the knee  out, and cemented all 3 components in simultaneously.  Vancomycin was  used in the cement.  At this particular time, we made sure that we had  good stability with our 15-mm thickness insert and we did.  We removed  the trial tibial component, search for cement, removed loose pieces of  cement, Water Pik'd the knee out again.  Following that, I then inserted  my  permanent size two, 15-mm thickness tibial rotating platform insert,  reduced the knee, inserted my Hemovac drain, and then closed the knee in  layers in the usual fashion.  The patient left the operating room in  satisfactory condition after sterile dressing was applied.           ______________________________  Georges Lynch [name], M.D.     RAG/MEDQ  D:  09/13/2008  T:  09/13/2008  Job:  [id number]   cc:   Luis Abed, MD, [facility]  [address]  [address]  [place]  [place] [address]
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
## 2011-01-14 NOTE — Consult Note (Signed)
NAMEMERIAN, WROE               ACCOUNT NO.:  [id number]   MEDICAL RECORD NO.:  [id number]          PATIENT TYPE:  INP   LOCATION:  1612                         [facility]ACILITY:  [facility]   PHYSICIAN:  Sigmund I. Patsi Sears, M.D.DATE OF BIRTH:  03/21/1927   DATE OF CONSULTATION:  DATE OF DISCHARGE:                                 CONSULTATION   SUBJECTIVE:  Mrs. Fordham is a 77 year old female, status post right  total knee arthroplasty on September 14, 1998.  Patient had Foley catheter  for the duration of her surgery but when Foley catheter was removed the  patient has had difficulty voiding with bedside commode.  She has  required intermittent catheterization.   The patient's past urologic history is significant for a poor voiding  pattern for many years.  She has had CT scan of pelvis in 2007 which was  negative for overt disease.  The patient strains to void for many years  and voids by double void technique since 2007.  Cystourethroscopy per  Dr. Earlene Plater showed normal-appearing bladder.  The patient has been on  Macrodantin suppressive therapy for 2 years.   REMAINING PAST MEDICAL HISTORY:  Significant for:  1. Depression.  2. Diabetes (insulin dependent).   ALLERGIES:  NONE KNOWN.   CURRENT MEDICATIONS:  Include:  1. Coumadin.  2. Insulin.  3. Lexapro.  4. Lisinopril.  5. Hydrochlorothiazide.  6. Trimethoprim sulfa.   Tobacco, none.  Alcohol, none.   PHYSICAL EXAM:  Shows a well-developed, well-nourished white female in  no acute distress.  Her blood pressure is 137/84.  Temperature 98.8.  Pulse is 91.  Respiratory rate 20.  O2 saturation 95%.  NECK:  Supple, nontender.  No nodes.  CHEST:  Clear to P and A.  ABDOMEN:  Moderately obese.  Normal bowel sounds.  Without organomegaly  and without masses.  GENITOURINARY:  Shows normal BUS.  EXTREMITIES:  Show no cyanosis or edema.  She is status post right total  knee arthroplasty.   IMPRESSION:  Patient with chronic  hypotonic bladder, probably secondary  to diabetes.  Note, her past history is also noted for hypertension,  coronary artery disease, gastroesophageal reflux disease, and  depression, as well as  diabetes.  She has been on antibiotic suppression therapy for 2 years.  She has negative urine culture.  She is okay for discharge from urology  standpoint and will void by double void technique at home, to follow up  with Dr. Earlene Plater next week.      Sigmund I. Patsi Sears, M.D.  Electronically Signed     SIT/MEDQ  D:  09/16/2008  T:  09/16/2008  Job:  [id number]   cc:   Georges Lynch. [name], M.D.  Fax: [phone]   Lucrezia Starch. Earlene Plater, M.D.  Fax: [phone]

## 2011-01-14 NOTE — Assessment & Plan Note (Signed)
[facility]                            CARDIOLOGY OFFICE NOTE   Cassidy [name], Cassidy [name]                      MRN:          [id number]  DATE:12/29/2007                            DOB:          [birth year]    Cassidy [name] is seen for followup.  She has noted some increase in heart  rate after eating.  This occurs whether sitting for exercising.  She is  not having any chest pain.  When I saw her last, we had done a Myoview  scan.  This was done on November 02, 2007.  Stress was done with adenosine.  There was no sign of ischemia, and the ejection fraction was high  normal.   PAST MEDICAL HISTORY:   ALLERGIES:  CODEINE and LORCET.   MEDICATIONS:  Novolin, nitrofurantoin, Actoplus MET, zolpidem,  lisinopril, hydrochlorothiazide, Lidoderm patch, and aspirin.   OTHER MEDICAL PROBLEMS:  See the complete list below.   REVIEW OF SYSTEMS:  Other than the HPI, her Review of Systems is  negative.   PHYSICAL EXAMINATION:  VITAL SIGNS:  Weight is 256 pounds.  This would  appear to be a marked increase since her last visit.  We will check her  weight again to be sure.  Blood pressure is 112/68. Pulse is 68.  GENERAL:  The patient is oriented to person, time and place.  Affect is  normal.  She is here with her husband today.  HEENT:  Reveals no xanthelasma.  She has normal extraocular motion.  NECK:  There are no carotid bruits.  There is no jugular venous  distention.  LUNGS:  Clear.  Respiratory effort is not labored.  CARDIAC:  Exam reveals S1-S2.  There is a 3/6 systolic murmur.  ABDOMEN:  Obese but soft.  EXTREMITIES:  She has question of trace peripheral edema.   No labs were done today.   PROBLEMS INCLUDE:  1. Hypertension, controlled.  2. Diabetes.  3. Ventral hernia.  4. Normal left ventricular function.  5. Asthma.  6. Morbid obesity.  7. History of colon polyps.  8. Blindness of the left eye related to a stroke of some type in the      past.  9. Elevated cholesterol.  10.Status post hysterectomy.  11.Coronary disease.  She had a stent to the right coronary in 2001.  12.Aortic valve disease.  Her echocardiogram done on October 11, 2007,      does show moderate calcification of the aortic valve with moderate      reduction and mild aortic stenosis.  This will have to be followed      over time.  13.Mild aortic root dilatation.  14.Mild mitral regurgitation.  15.Atrial fibrillation. She will not use Coumadin because of her eyes.  16.Obesity.  We need to recheck her weight.  17.Sensation of increased heart rate around the time of eating.  It is      possible that she is having increased heart rate from digestion.      She is significantly overweight.  We will put her on a small dose      of  Cardizem, and I will see her back.  We will then decide about      possible event recorder.  Also, her volume is slightly increased at      this time.  She may be need to be diuresed, but I will not push for      that as of today.  I will see her for early followup.     Luis Abed, MD, [facility]  Electronically Signed    JDK/MedQ  DD: 12/29/2007  DT: 12/29/2007  Job #: [id number]   cc:   Ace Gins, MD  Cassidy [name], M.D.

## 2011-01-14 NOTE — Assessment & Plan Note (Signed)
[facility]                            CARDIOLOGY OFFICE NOTE   ASHLEE, PLAYER                      MRN:          [id number]  DATE:09/30/2007                            DOB:          05-29-1927    Ms. Baltz is 77 years old.  She is here for cardiac evaluation.  I do  take care of her husband.  She has had shortness of breath and some  chest discomfort.  She does have coronary disease.  She had an  intervention done by Dr. Aleen Campi  in 2001.  Historically, she has had  good LV function.  She does have aortic valve sclerosis.  She had an  echo done in 2005 in our office.  At that time she had aortic valve  sclerosis and mild aortic insufficiency and mild mitral regurgitation.  Her left ventricular function at that time was normal.  There was mild  asymmetric septal hypertrophy but no SAM and no left ventricular outflow  tract gradient.  More recently she has been fatigued.  She says she  feels short of breath when she lies down at night.  She also has some  chest heaviness and radiation to the left arm when she lays down at  night.  She does not appear to have these symptoms when she is up  walking around during the day.  She is significantly overweight.  She  has not had any syncope or presyncope.   In addition she has atrial fib.  We did not see this on an EKG in 2005.  EKG on September 03, 2007 showed atrial fib and currently she remains in  atrial fib.   PAST MEDICAL HISTORY:  Allergies to CODEINE.   MEDICATIONS:  Novolin, aspirin, nitrofurantoin, zolpidem,  lisinopril/hydrochlorothiazide and Lidoderm patch.    Other medical problems see the list below.   SOCIAL HISTORY:  She is married and here with her husband today.  She is  retired.  She does not smoke.   FAMILY HISTORY:  There is a strong family history of coronary disease.   REVIEW OF SYSTEMS:  Other than her HPI her review of systems today is  negative.   PHYSICAL EXAM:   Blood pressure is 132/80 with a pulse of 71.  The  patient is oriented to person, time and place.  Affect is normal.  She  is markedly overweight for her height.  HEENT:  Reveals no xanthelasma.  She has normal extraocular motion.  NECK:  There is no jugular venous distention.  She has either carotid  bruits or radiation of her murmur into her neck.  LUNGS are clear.  Respiratory effort is not labored.  CARDIAC exam reveals the S1 is heard.  I believe S2 was heard.  There is  a systolic murmur.  It is most compatible with aortic murmur.  However,  I cannot rule out mitral regurgitation.  Her sounds are distant.  ABDOMEN is obese but soft.  She has trace peripheral edema.  As  mentioned she is markedly overweight.   EKG today reveals atrial fibrillation.  The patient tells  me that she  did have a chest x-ray actually in 2008.  This was done on May 12, 2007 and at that time she had mild chronic peribronchial markings and no  evidence of pneumonia.  Her BUN was 15 and her creatinine 0.97 1n  July  2008.   PROBLEMS INCLUDE:  1. Hypertension under control today.  2. Diabetes  3. History of ventral hernia  4. History of normal LV function  5. History of asthma  6. Morbid obesity  7. History of colon polyps.  8. Blindness in the left eye presumably from a type of stroke in the      past  9. Elevated cholesterol  10.Status post hysterectomy  11.Coronary artery disease.  By history, she had a stent to the right      coronary artery in 2001.  We will get her most recent cath report      out of the computer or from the cath lab.  12.Aortic valve sclerosis by history.  Her murmur now sounds louder.      We need to rule out significant stenosis and will obtain a 2-D      echo.  13.History of mitral regurgitation.  14.Atrial fibrillation.  She has had atrial fib since at least September 03, 2007 and I suspect much longer.  We cannot be sure.  We will      have to seriously consider  Coumadin.  I have chosen not to do that      yet as of today.  Her rate seems to be controlled.  We will obtain      a 2-D echo.  With this information I can then decide if it will be      safe to do an adenosine Myoview scan and we will decide about the      issues of coumadinization and whether we should try cardioversion      on one occasion.  Clearly weight loss is indicated.  Concerning her      shortness of breath and some chest discomfort with arm radiation,      we will continue workup of this issue after the data mentioned      above is obtained.     Luis Abed, MD, [facility]  Electronically Signed    JDK/MedQ  DD: 09/30/2007  DT: 09/30/2007  Job #: [id number]   cc:   Ace Gins, MD

## 2011-01-14 NOTE — Consult Note (Signed)
NAMEORPHA, Cassidy [name]               ACCOUNT NO.:  [id number]   MEDICAL RECORD NO.:  [id number]          PATIENT TYPE:  INP   LOCATION:  1612                         FACILITY:  [facility]   PHYSICIAN:  Marcellus Scott, MD     DATE OF BIRTH:  1927/01/30   DATE OF CONSULTATION:  09/16/2008  DATE OF DISCHARGE:                                 CONSULTATION   REFERRING PHYSICIAN:  Georges Lynch. [name], M.D.   PRIMARY MEDICAL DOCTOR:  Ace Gins, MD   PRIMARY CARDIOLOGIST:  Luis Abed, MD, [facility]   REASON FOR CONSULTATION:  Altered mental status.   HISTORY OF PRESENT ILLNESS:  Cassidy [name] is a very pleasant 77 year old  Caucasian female patient with extensive past medical and surgical  history as indicated below.  She is status post right  total knee  arthroplasty for  end-stage osteoarthritis on September 13, 2008.  We were  asked to consult on this patient secondary to alteration in mental  status and confusion.  The exact duration of this is unclear.  On  reviewing her progress notes, the confusion is mentioned for the first  time today.  According to the nurse taking care of the patient, she,  however, has improved and currently is quite coherent.   The patient herself indicates that apart from some mild back pain and  right knee pain, she has no complaints.  She indicates that since the  surgery she has not rested well at night, especially on the night of the  surgery when there was some disturbance in the hallway and she was  scared to go to sleep.  She also indicates that whenever she has taken  pain medications in the past she has had confusion.  She indicates she  knows she was confused and is now better.  Currently she seems quite  coherent and appropriate in her responses.   PAST MEDICAL HISTORY:  1. Hypertension.  2. Dyslipidemia.  3. Mild coronary artery disease status post stent in 2001.  4. Atrial fibrillation and not a Coumadin candidate according to      cardiology  notes.  5. End-stage osteoarthritis of the right knee.  6. Colonic polyps.  7. Myoview on 11/02/2007 with no evidence of ischemia and had high      normal left ventricular ejection fraction.  8. Gastroesophageal reflux disease.  9. Insulin requiring type 2 diabetes mellitus.  10.Chronic urinary tract infection on Bactrim suppression therapy.  11.History of  kidney stones.  12.Cataracts.  13.Obesity.  14.Mild aortic stenosis.  15.Left eye blindness secondary to diabetic complications.   PAST SURGICAL HISTORY.:  1. Back surgery in 2005.  2. Shoulder surgery in 2008.  3. Hysterectomy.  4. Tubal ligation.  5. Appendectomy.   PSYCHIATRIC HISTORY:  Depression.   ALLERGIES:  LATEX, CODEINE, LORCET.   Current medications per MAR:  Colace, Lexapro, ferrous sulfate,  hydrochlorothiazide, NovoLog sliding scale insulin, IV Ringer's lactate,  lisinopril, metformin, Actos, Bactrim, Coumadin.  Then she has p.r.n.  meds: Robaxin and Dulcolax.  Her Dilaudid has been discontinued and the  patient has been placed on p.o.  Darvocet since this morning.   FAMILY HISTORY:  The patient's father died at age 53 from cardiac  reasons.  The patient's mother died at age 59 from colonic cancer.   SOCIAL HISTORY:  The patient is married for the last 61 years and lives  with her spouse.  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  Her husband was an alcoholic, quit 2 years ago..  There  is no history of alcohol use or drug abuse.   REVIEW OF SYSTEMS:  Comprehensive 14 system review done and apart from  history of presenting illness is noncontributory.   The patient had episode of hypoglycemia with blood sugar of  55 mg/dL at  16:10 p.m. which was treated with dextrose, water and resolved.  Of note  she has had hypoglycemic episodes even last night.   PHYSICAL EXAMINATION:  GENERAL:  [name] is a moderately built and  obese female patient who is sitting on a reclining chair  in no obvious  distress.  VITAL SIGNS:  Temperature 98.6, pulse 110 recorded earlier but 96 on my  examinationand regular, respirations 20 per minute,.  Blood pressure  142/83 mmHg, saturating at 95% on room air.  HEENT:  Nontraumatic normocephalic.  Bilateral pupils equally reactive  to light and accommodation.  Left eye mature cataract.  Right eye with  intraocular lens.  NECK:  Supple.  No JVD or carotid bruit.  LYMPHATICS:  No lymphadenopathy.  RESPIRATORY:  Occasional  basilar crackles which seem chronic.  Otherwise clear to auscultation  CARDIOVASCULAR:  First and second heart sounds heard.  Systolic ejection  murmur 2/6 best heard at the right upper sternal edge.  ABDOMEN:  Obese.  Nontender, no organomegaly or mass appreciated.  Bowel  sounds are normally heard.  CENTRAL NERVOUS SYSTEM:  The patient is awake, alert, oriented x4.  No  focal neurological deficits.  EXTREMITIES:  With the right lower extremity in immobilizer.  Left lower  extremity with no cyanosis, clubbing or edema.  Peripheral pulses are  symmetrically felt.  SKIN:  Without any rashes.   LABORATORY DATA:  CBG: 72/49/55/155/ 89/ 50 milligrams per deciliter at  10:00 p.m. last night/ 46 mg/dL at 9:60 p.m. last night.  Basic metabolic panel:  Sodium 132, potassium 3.4, chloride 96,  bicarbonate  27, glucose 149, BUN 22, creatinine 1.15, calcium 9.1,  hemoglobin 10, hematocrit 30.3, INR is 2.  Urine culture of January 13  is no growth.   ASSESSMENT AND PLAN:  1. Altered mental status.  This is likely secondary to narcotic pain      medications.  This already seems to be improving/ probably      resolved.  There are no clinical features suggestive of sepsis and      the patient has no focal neurological deficits.  Hypoglycemic      spells were transient and there seems no relationship with those.      Recommend using as little narcotic pain medication as needed.      Already the patient's Dilaudid has been  discontinued and the      patient has been switched to Vicodin.  2. Diabetes mellitus, with episodes of hypoglycemia.  We will DC the      sliding scale insulin and continue the patient's home 70/30 insulin      and metformin and Actos.  3. Hypokalemia - being repleted  4. Acute blood loss anemia.  Follow CBCs and transfuse if  hemoglobin      is less than 10 given her history of coronary artery disease.  5. Atrial fibrillation which is mostly with controlled ventricular      rate.  She is not a candidate for anticoagulation.  However,      currently she is on Coumadin  for short term for DVT prophylaxis      from an orthopedic standpoint.   Thank you for this consultation.      Marcellus Scott, MD  Electronically Signed     AH/MEDQ  D:  09/16/2008  T:  09/16/2008  Job:  [id number]   cc:   Georges Lynch. [name], M.D.  Fax: [phone]   Ace Gins, MD   Luis Abed, MD, [facility]  [address]  [address]  [place]  [place] [address]

## 2011-01-14 NOTE — Assessment & Plan Note (Signed)
[facility]                            CARDIOLOGY OFFICE NOTE   [name], FREE                      MRN:          [id number]  DATE:01/27/2008                            DOB:          April 29, 1927    Ms. Cassidy [name] is seen for followup.  See my note of December 29, 2007.  At  that time we started low-dose Cardizem.  She says that she took it and  in fact it made her feel much better.  However, she had problems  urinating and the pharmacist encouraged her to stop it.  She thinks the  medicine helped stabilize her situation and she now she is fine without  it.   PAST MEDICAL HISTORY:   ALLERGIES:  CODEINE, LORCET.   MEDICATIONS:  Novolin, nitrofurantoin, Actoplus met, lisinopril  hydrochlorothiazide, Lidoderm patches, aspirin and Lexapro.   OTHER MEDICAL PROBLEMS:  See the list on my note of September 30, 2007.   REVIEW OF SYSTEMS:  Review of systems today is negative.  She feels  great.   PHYSICAL EXAM:  Weight is 195 pounds which appears to be decreasing.  Blood pressure 130/64 with a pulse of 57.  The patient is oriented to person, time and place.  Affect is normal.  She is here with her husband.  HEENT:  Reveals no xanthelasma.  She has normal extraocular motion.  She does have a murmur that radiates to her neck.  Lungs are clear.  CARDIAC:  Exam reveals her murmur of aortic valve sclerosis.  Her abdomen is soft.  She has no significant peripheral edema.   Problems are listed completely on my note of December 29, 2007.  Her  overall status is stable.  She does have mild aortic stenosis.  I will  see her back in 6 months.     Luis Abed, MD, [facility]  Electronically Signed    JDK/MedQ  DD: 01/27/2008  DT: 01/27/2008  Job #: [id number]   cc:   Ace Gins, MD  Beulah Gandy. Ashley Royalty, M.D.

## 2011-01-14 NOTE — Assessment & Plan Note (Signed)
[facility]                            CARDIOLOGY OFFICE NOTE   JAMITA, MCKELVIN                      MRN:          [id number]  DATE:10/22/2007                            DOB:          [birth year]    HISTORY:  Ms. Blizzard is seen for followup.  I saw her on September 30, 2007.  At that time, we decided to proceed with a 2-D echo.  The study  shows that she has normal left ventricular systolic function.  She does  have mild-moderate aortic stenosis.  This will need to be followed over  time.  I have seen her back today.  She says that she is feeling  relatively well.  She has not had any chest pain.  She says that she  only has severe shortness of breath during sexual activity.  Her husband  was in the room and I believe this is an activity that will need to be  not active for her going forward.   We also had a very careful discussion about her atrial fibrillation and  the possibility of Coumadin use.  The patient has diabetes.  She has  lost vision in her left eye and has had some difficulty with the right  eye.  She is very fearful of Coumadin.  I certainly agree and I am not  pushing towards starting that at this time based on this problem.  Otherwise, she is stable.   ALLERGIES:  1. CODEINE.  2. LORCET.   MEDICATIONS:  1. Novolin.  2. Aspirin 81.  3. Nitrofurantoin.  4. Actoplus.  5. Soma.  6. Zolpidem.  7. Lisinopril.  8. Hydrochlorothiazide.  9. Lidoderm patches.   OTHER MEDICAL PROBLEMS:  See the complete list on the note of September 30, 2007.   REVIEW OF SYSTEMS:  She is stable today.  Her review of systems is  negative.   PHYSICAL EXAMINATION:  VITAL SIGNS:  Weight is 201 pounds.  Blood  pressure is 139/65 with a pulse of 62.  GENERAL:  The patient is oriented to person, time and place.  Affect is  normal.  HEENT:  Reveals no xanthelasma.  She has normal extraocular  motion.  NECK:  There are no carotid bruits.  There is  no jugular venous  distention.  LUNGS:  Clear.  Respiratory effort is not labored.  CARDIAC:  Reveals S1-S2.  The rhythm is irregularly irregular, but the  rate is controlled.  There is a systolic murmur.  ABDOMEN:  Obese, but soft.  EXTREMITIES:  She has no significant peripheral edema.  She is  overweight.   PROBLEM:  Problems are listed on the note of September 30, 2007.   DISCUSSION:  As mentioned above, we carefully discussed Coumadin, but  because of her eyes this will not be started.  We do need to make sure  that her shortness of breath is not ischemic in origin.  She will have  an adenosine Myoview and then I will see her back.     Luis Abed, MD, [facility]  Electronically Signed  JDK/MedQ  DD: 10/22/2007  DT: 10/23/2007  Job #: [id number]   cc:   Ace Gins, MD  Beulah Gandy. Ashley Royalty, M.D.

## 2011-01-14 NOTE — Assessment & Plan Note (Signed)
[facility]                         GASTROENTEROLOGY OFFICE NOTE   NAME:Gerdts, DASHA KAWABATA                      MRN:          [id number]  DATE:11/22/2007                            DOB:          1927/05/09    PROBLEM:  Family history of colorectal carcinoma.   Mrs. Sferrazza has returned to set up colonoscopy. Family history is  pertinent for a parent who had colon cancer.  Mrs. Latino last  screening colonoscopy was 2003 where left-sided diverticulosis only was  seen.  She currently has no GI complaints.  Under Dr. Jerral Bonito' care  for hypertension and atrial fibrillation,   Other medical problems include:  1. Diabetes.  2. Asthma.  3. Obesity.  4. Coronary artery disease.   MEDICATIONS:  Insulin, nitrofurantoin, Actoplus, xaliproden,  lisinopril/HCTZ, Lidoderm patches, aspirin.   PHYSICAL EXAMINATION:  VITAL SIGNS:  Pulse 72, blood pressure 130/80,  weight 208.  HEENT:  EOMI.  PERRLA.  Sclerae are anicteric.  Conjunctivae are pink.  NECK:  Supple without thyromegaly, adenopathy or carotid bruits.  CHEST:  Clear to auscultation and percussion without adventitious  sounds.  CARDIAC:  Regular rhythm; normal S1 S2.  There is a 2-3/6 early systolic  murmur at the left sternal border.  ABDOMEN:  Bowel sounds are normoactive.  Abdomen is soft, nontender and  nondistended.  There are no abdominal masses, tenderness, splenic  enlargement or hepatomegaly.  EXTREMITIES:  Full range of motion.  No cyanosis, clubbing or edema.  RECTAL:  Deferred.   IMPRESSION:  1. Family history of colorectal cancer.  2. Atrial fibrillation.  3. Coronary artery disease.  4. Hypertension.  5. Diabetes.   RECOMMENDATION:  Followup colonoscopy.     Barbette Hair. Arlyce Dice, MD,FACG  Electronically Signed    RDK/MedQ  DD: 11/22/2007  DT: 11/22/2007  Job #: [id number]   cc:   Luis Abed, MD, [facility]

## 2011-01-17 NOTE — Discharge Summary (Signed)
NAME:  OMER, MONTER                         ACCOUNT NO.:  [id number]   MEDICAL RECORD NO.:  [id number]                   PATIENT TYPE:  INP   LOCATION:  3011                                 FACILITY:  [facility]   PHYSICIAN:  Danae Orleans. [name], M.D.               DATE OF BIRTH:  03/14/1927   DATE OF ADMISSION:  11/27/2003  DATE OF DISCHARGE:  11/30/2003                                 DISCHARGE SUMMARY   REASONS FOR ADMISSION:  Lower extremity edema with urinary tract infection,  urinary retention, status post arthrodesis, type 2 diabetes, coronary  atherosclerosis, status post percutaneous transluminal coronary angioplasty,  hypertension, hyperlipidemia, lumbosacral neuritis, sciatic nerve lesion and  bacterial infections due to other gram-negative organisms.   FINAL DIAGNOSES:  1. Lower extremity edema.  2. Urinary tract infection.  3. Urinary retention.  4. Status post arthrodesis.  5. Type 2 diabetes.  6. Coronary atherosclerosis, status post percutaneous transluminal coronary     angioplasty.  7. Hypertension.  8. Hyperlipidemia.  9. Lumbosacral neuritis.  10.      Sciatic nerve lesion.  11.      Bacterial infection due to other gram-negative organisms.   HISTORY OF ILLNESS AND HOSPITAL COURSE:  Azuri Bozard is a 77 year old  woman who recently underwent lumbar decompression and fusion at the L4-5  level.  She had done well, but then developed bilateral lower extremity  pitting edema and presented to my office complaining of bilateral calf  tenderness.  She was sent for bilateral lower extremity Doppler examinations  which were suggestive although not definitive for high DVT above the common  femoral vein.  She was admitted with presumed DVT for abdominopelvic CT scan  with the anticipation that she would require anticoagulation if this turned  out to be the case.  The patient was seen with the hospitalist and  subsequently, CT scan did not demonstrate DVT.  The patient was  found to  have a urinary tract infection and was started on antibiotics for that.  She  was doing better with her pitting edema and was eventually discharged home  in stable and satisfactory condition with instructions to follow up with Dr.  Danae Orleans. [name] in the office and Dr. Arlyce Dice, her primary doctor, after  discharge.                                                Danae Orleans. [name], M.D.    JDS/MEDQ  D:  01/25/2004  T:  01/27/2004  Job:  [id number]

## 2011-01-17 NOTE — Op Note (Signed)
[name], [name] [facility]                       ACCOUNT NO.:  [id number]   MEDICAL RECORD NO.:  [id number]                   PATIENT TYPE:  AMB   LOCATION:  NESC                                 FACILITY:  [facility]   PHYSICIAN:  Ronald L. Ovidio Hanger, M.D.           DATE OF BIRTH:  1926-12-24   DATE OF PROCEDURE:  02/10/2003  DATE OF DISCHARGE:                                 OPERATIVE REPORT   PREOPERATIVE DIAGNOSES:  Bladder pain, possible interstitial cystitis.   POSTOPERATIVE DIAGNOSES:  Bladder pain, possible interstitial cystitis.   OPERATION:  Cystourethroscopy, hydraulic bladder distention and bladder  biopsy.   SURGEON:  Lucrezia Starch. Earlene Plater, M.D.   ANESTHESIA:  General mask.   ESTIMATED BLOOD LOSS:  Negligible.   TUBES:  None.   COMPLICATIONS:  None.   TOTAL BLADDER CAPACITY:  500 mL in 80 cm of water and there were  glomerulations and Hunner's ulcers noted.   INDICATIONS FOR PROCEDURE:  Cassidy [name] is a very nice 77 year old white  female who presents with bladder pain which has been progressive. She has no  dysuria or hematuria. She has had recurrent cystitis in the past. She does  have some urgency, frequency and despite anticholinergics and antibiotics  has had persistent pelvic pain. A CT scan revealed diverticulosis only and  after understanding the risks, benefits, and alternatives has elected to  proceed with diagnostic and therapeutic treatment to rule out interstitial  cystitis.   DESCRIPTION OF PROCEDURE:  The patient was placed in the supine position and  after proper general mask anesthesia was placed in the dorsal lithotomy  position, prepped and draped with Betadine in a sterile fashion.  Cystourethroscopy was performed with a 22.5 French Olympus panendoscope  utilizing the 12 and 70 degree lenses. The bladder was carefully inspected,  efflux of clear urine was noted from the normally placed ureteral orifices  bilaterally and no lesions in the bladder or  the urethra. Hydraulic bladder  distention was then gradually performed up to 80 cm with water pressure. She  had a capacity of 500 mL and was backing up into the IV setup. The bladder  was then drained and reinspection revealed two Hunner's ulcers near the dome  with some active oozing and diffuse glomerulations especially in the right  lateral bladder wall. Biopsies were obtained of an area just medial to the  area of the glomerulations posterior midline and submitted to pathology. The  base was cauterized with Bugbee coagulation cautery, the bladder was  drained, the panendoscope was removed and the patient was taken to the  recovery room stable.                                              Ronald L. Ovidio Hanger, M.D.   RLD/MEDQ  D:  02/10/2003  T:  02/10/2003  Job:  [id number]

## 2011-01-17 NOTE — Discharge Summary (Signed)
NAMEALTAIR, STANKO [facility]                       ACCOUNT NO.:  [id number]   MEDICAL RECORD NO.:  [id number]                   PATIENT TYPE:  INP   LOCATION:  5027                                 FACILITY:  [facility]   PHYSICIAN:  Stefani Dama, M.D.               DATE OF BIRTH:  1927-07-02   DATE OF ADMISSION:  10/26/2003  DATE OF DISCHARGE:  10/30/2003                                 DISCHARGE SUMMARY   ADMISSION DIAGNOSIS:  Lumbar spondylolisthesis with stenosis and lumbar  radiculopathy L4-5.   FINAL DIAGNOSES:  Lumbar spondylolisthesis with stenosis and lumbar  radiculopathy L4-5.   PROCEDURE:  Lumbar decompression L4, Gill procedure pedicle fixation L4-5,  interbody graft and posterolateral arthrodesis.   CONDITION ON DISCHARGE:  Improving.   HOSPITAL COURSE:  The patient is a 77 year old individual who has had  significant problems with back pain, bilateral leg pain, and weakness.  She  was found to have a degenerative spondylolisthesis at the L4-5 level with  significant nerve root entrapment. She was taken to the operating room where  she underwent a lumbar laminectomy.  Fixation was performed at L4-5 with  pedicle screws and interbody grafting.  She tolerated the procedure well,  was gradually mobilized, and on the fourth hospital day, she is discharged  home.  Her incision is clean and dry. She is given a prescription for  Percocet for pain medication and Flexeril as a muscle relaxer.  She will be  seen in the office in approximately three weeks' time for further follow-up.  She received no transfusions during this hospitalization and tolerated her  hospitalization extremely well.                                                Stefani Dama, M.D.    Merla Riches  D:  10/31/2003  T:  11/01/2003  Job:  [id number]

## 2011-01-17 NOTE — H&P (Signed)
[place]. [facility]  Patient:    Cassidy [name], Cassidy [name]                      MRN: [id number] Adm. Date:  [id number] Attending:  Clovis Cao Dictator:   Donzetta Matters, P.A. CC:         Teena Irani. Arlyce Dice, M.D.                         History and Physical  DATE OF BIRTH:  07/23/1927.  CHIEF COMPLAINT:  Chest pain.  HISTORY OF PRESENT ILLNESS:  This is a 77 year old female that has had chest pain for two to three weeks.  She had been scheduled to be seen in the office on the  12th.  She had had increasing symptoms and states she had anterior chest pain radiating to her left arm, neck and through to her back on the date of admission. She got up and took an aspirin and went back to bed with some relief.  She does  have history of having a previous heart catheterization approximately 20 years go; at that time had some significant muscle bridging at one of her main arteries but followup Persantine study did not show any ischemia.  She has had recurrent pain recently; has not been seen by Dr. Aram Candela. Tysinger or Dr. Jaclyn Prime. Grove in over approximately four years, last seen on October 24, 1994.  She has been receiving her regular family care from Dr. Teena Irani. Arlyce Dice since that time.  ALLERGIES:  CODEINE and LORCET.  MEDICATIONS: 1. Insulin 70/30 20 units twice a day. 2. Avandia 8 mg p.o. q.d. 3. Atacand 16 mg daily. 4. Relafen 750 mg daily. 5. Macrobid 100 mg q.d. x 30 days.  PAST MEDICAL HISTORY:  History as listed above.  She also has had an appendectomy with tubal ligation in 1948, upper endoscopy in 1970s, hysterectomy in 1975, bilateral rotator cuff repairs in 1985 and 1990.  She has had repeated kidney infections with a cystoscopy; is being followed by Dr. Excell Seltzer. Annabell Howells, M.D., who  presently has her on Macrobid 100 mg a day for 30 days.  She had right knee arthroscopic surgery in 2000.  She has had hypertension for many years and  is presently being managed with Atacand.  She denies any feeling of falling.  No weakness or numbness.  Also has history of arthritis/back pain that have been present for many years.  She has had dyslipidemia as well and states she has had ulcers many years ago.  FAMILY HISTORY:  Father died of heart disease at age of 56.  Mother died of colon cancer, age 35.  SOCIAL HISTORY:  She is retired.  No alcohol use.  No cigarette use.  Lives with family.  She does use a cane, walker as well as shower chair for assistance at home; otherwise, she is independent of all activities of daily living.  REVIEW OF SYSTEMS:  Denies any difficulty with breathing.  States she did feel ome tightness when she came to the hospital.  She denies any dizziness or numbness r tingling.  No history of falling.  Denies any difficulty with bleeding problems. Does have diabetes.  Denies any problems with vision.  States teeth in good repair, the ones that she does have.  She does take Relafen for her generalized arthritis. She does have a problem with some dry skin as well  as arthritis-type back pain hat she stated is from kidney cysts.  States she does have some problems with sleeping, she uses no current medications and states she walks the floor when this happens. She describes no change in appetite, no problems with swallowing, states her weight has remained stable.  PHYSICAL EXAMINATION:  VITAL SIGNS:  Temperature is 98.7, pulse is 77, respirations 20, blood pressure is 149/58 on admission.  She is 95% on room air.  GENERAL:  This is an older female at present time in no acute distress.  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  Minimal reaction to right pupil.  She has a few teeth; they are in good repair.  No dentures.  NECK:  Supple.  There is no JVD, no bruits, no thyromegaly, no adenopathy.  CHEST:  Clear to  auscultation.  BREASTS:  Normal female.  ADENOPATHY:  No cervical adenopathy.  HEART:  Regular rate and rhythm with soft 2/6 systolic ejection murmur at left sternal border.  ABDOMEN:  Soft with active bowel sounds.  No bruits.  No epigastric tenderness. Is nontender over the bladder today.  EXTREMITIES:  No edema.  Brisk pulses to radius as well as dorsalis pedis.  SKIN:  Intact with no rashes, no cyanosis and no clubbing.  LABORATORY AND X-RAY FINDINGS:  EKG shows normal sinus rhythm, first-degree block, at rate of 71.  Chest x-ray shows no active disease, questionable left lower lobe density.  IMPRESSION: 1. Chest pain. 2. Diabetes, insulin dependent. 3. New-onset murmur.  PLAN:  Serial enzymes.  IV nitroglycerin and IV heparin.  Repeat EKG in the a.m. Routine medications.  Will obtain echo as an outpatient for followup of the new  murmur.  She is to follow up with Dr. Arlyce Dice regarding abnormal chest x-ray. DD:  12/06/99 TD:  12/07/99 Job: [id number]

## 2011-01-17 NOTE — H&P (Signed)
NAMERANYA, FIDDLER               ACCOUNT NO.:  [id number]   MEDICAL RECORD NO.:  [id number]          PATIENT TYPE:  INP   LOCATION:                               FACILITY:  [facility]   PHYSICIAN:  Georges Lynch. Gioffre, M.D.DATE OF BIRTH:  1927/01/03   DATE OF ADMISSION:  09/13/2008  DATE OF DISCHARGE:                              HISTORY & PHYSICAL   CHIEF COMPLAINT:  Painful range of motion, right knee.   HISTORY OF PRESENT ILLNESS:  Patient is a 77 year old female who is a  long-known patient to Dr. Jeannetta Ellis, been having chronic issues in her  right knee.  Evaluation shows that she has severe osteoarthritis of the  right knee with complete collapse of the medial joint line.  The patient  has failed conservative treatment.  She would like to proceed with a  total knee arthroplasty.   ALLERGIES:  LATEX.   CURRENT MEDICATIONS:  1. Insulin 70/30 30 units in the a.m., 25 units in the p.m.  2. Actos 15/5 once a day.  3. Alprazolam 2 mg at bedtime.  4. Lexapro 10 mg once a day.  5. Nitrofurantoin 50 mg once a day.  6. Lisinopril/hydrochlorothiazide  20/25 once a day.   PRIMARY CARE PHYSICIAN:  Dr. Larina Bras.   CARDIOLOGIST:  Dr. [name].   CURRENT MEDICAL HISTORY:  1. Hypertension.  2. Hypercholesterolemia.  3. Coronary artery disease with a cardiac stent.  4. Cardiac murmur.  5. Cardiac arrhythmia.  6. Depression.  7. Reflux disease.  8. Insulin-controlled diabetes.  9. Chronic urinary tract infection with a history of cystitis.  10.History of kidney stones.  11.History of cataracts.  12.History of obesity.   REVIEW OF SYMPTOMS:  NEUROLOGIC:  Negative for any neurologic issues.  PULMONARY:  She has had bronchitis in the past, nothing recent, no  shortness of breath or productive cough.  CARDIOVASCULAR:  She denies any recent chest pains or irregular heart  rhythms.  GI:  She does have stable reflux without any recent issues.  No problems  with any other ulcers, diverticulitis,  colitis, or hepatitis.  GU:  She does have chronic urinary tract infections and she is on  nitrofurantoin.  ENDOCRINE:  She has been fairly stable with her glucose on the current  medications.  She does adjust her insulin doses once in awhile.   PAST SURGICAL HISTORY:  1. Back surgery in 2005.  2. Shoulder surgery in 2008.  3. Hysterectomy.  4. Appendectomy without any problems with anesthesia.   FAMILY HISTORY:  Father was deceased at the age 58 from his heart.  Mother was deceased at the age 77 from colon cancer.   SOCIAL HISTORY:  The patient is married.  She has never smoked.  No  alcohol.  She lives in a single-family home.  Her husband will take care  of her post op.   PHYSICAL EXAMINATION:  VITAL SIGNS:  Height is 5'2, weight is 195,  blood pressure is 130/62, pulse of 74 and regular, respirations 12,  patient is afebrile.  GENERAL:  This is a healthy-appearing, short-stature, heavy-set female,  does have  difficulty getting on and off the exam table due to the right  knee pain.  HEENT:  Head was normocephalic.  Pupils equal, round, and reactive.  Extraocular movements intact.  Gross hearing is intact.  NECK:  Supple.  No palpable lymphadenopathy.  Good range of motion.  CHEST:  Lung sounds were clear and equal bilaterally.  No wheezes,  rales, or rhonchi.  HEART:  Regular rate and rhythm.  She did have a grade 3/6 systolic  ejection murmur.  ABDOMEN:  Round, obese, soft, bowel sounds present.  EXTREMITIES:  Upper extremities, she had good range of motion of her  left shoulder, elbow, and wrist.  Right shoulder, she unable to bring it  up above shoulder-height in any direction.  She had full range of motion  of her elbow and wrist.  Lower extremities, both hips had excellent  range of motion, both knees had lacked about 5 degrees extension,  flexion to about 120.  No signs of erythema or ecchymosis about either  knee.  The calves were soft.  She had good motion of the  ankles.  PERIPHERAL VASCULAR:  Carotid pulses were 2+.  No bruits.  Radial pulses  were 2+.  Dorsalis pedis pulses were 1+.  NEURO:  The patient was conscious, alert, and appropriate.  BREASTS, RECTAL, AND GU EXAMS:  Deferred at this time.   IMPRESSION:  1. End-stage osteoarthritis, right knee.  2. Hypertension.  3. Coronary artery disease with cardiac stenting.  4. Hypercholesterolemia.  5. Cardiac murmur with arrhythmia.  6. Reflux disease.  7. Diabetes, insulin-controlled.  8. Chronic urinary tract infections with a history of cystitis.  9. History of depression.   PLAN:  The patient will undergo all routine labs and tests prior to  having a right total knee arthroplasty by Dr. [name] at [facility] on September 13, 2008.      Jamelle Rushing, P.A.    ______________________________  Georges Lynch [name], M.D.    RWK/MEDQ  D:  08/18/2008  T:  08/18/2008  Job:  [id number]   cc:   Windy Fast A. [name], M.D.  Fax: [phone]

## 2011-01-17 NOTE — Discharge Summary (Signed)
[place]. [facility]  Patient:    Cassidy [name], Cassidy [name]                      MRN: [id number] Adm. Date:  [id number] Disc. Date: [id number] Attending:  Clovis Cao Dictator:   Donzetta Matters, P.A.-C. CC:         Teena Irani. Arlyce Dice, M.D.                           Discharge Summary  DATE OF BIRTH:  1926/11/08  PRINCIPAL DIAGNOSES ON DISCHARGE: 1. Coronary artery disease. 2. High blood pressure. 3. Diabetes, insulin dependent. 4. New heart murmur. 5. Questionable density on chest x-ray.  HISTORY OF PRESENT ILLNESS/HOSPITAL COURSE:  This is a 77 year old female that was sent from the office of Drs. Hipolito Bayley with chest pain that had been occurring over two to three days associated with shortness of breath, denies any nausea or diaphoresis.  She does have history of recent urinary tract infections and has been put on Macrobid as a chronic therapy.  By the next day, she was reasonably comfortable on nitroglycerin drip.  She was then scheduled for heart catheterization on April 6 which did show single vessel coronary artery disease with 80% RCA lesion.  She underwent successful stent placement in the RCA with normal left ventricular function, mild pulmonary hypertension, mildly calcified aortic valve, mild mitral insufficiency.  Due to elevation of blood pressure, she did have increase of her Lopressor to 25 mg b.i.d.  She remained stable post heart catheterization and was ready for discharge to home on December 07, 1999.  LABORATORY DATA:  CBC on admission showed white count 6100, hemoglobin 11.2, hematocrit 33.4, platelet count 405.  This was followed closely in the hospital and remained in stable range.  Her initial coags on April 5 showed pro time at 13.3, INR of 1.1, PTT of 31.  Chemistries on admission showed sodium at 137, potassium 4.1, chloride 103, CO2 28, glucose 192, BUN 12, creatinine 0.8.  All other within normal range.  Alkaline phosphatase  slightly elevated at 134.  Repeat chemistries on April 7 did show a sodium of 131, potassium 3.2, chloride 98, CO2 of 28, glucose 287, BUN 15, creatinine 0.8, and calcium 9.1.  Cardiac enzymes were negative.  Lipid profile did note total cholesterol at 245, triglycerides 202, HDL 39, LDL 166.  Chest x-ray on April 5 did show a patchy density region of the lingula which could represent scarring, atelectasis, infiltrates, some increased lung markings which may be chronic.  Chest x-ray was repeated again on April 6 that did show interval improvement of aeration and passive hyperemia with some atelectasis and scarring in the region of the left lingula.  Aorta is elongated and minimally calcified.  EKG on admission showed normal sinus rhythm with first-degree AV block, rate 71.  This was repeated again on April 6, with sinus brady with first-degree block, rate 56.  Patient was ready for discharge home on December 07, 1999.  DISCHARGE MEDICATIONS: 1. Insulin 70/30 20 units twice a day. 2. Avandia 8 mg once a day. 3. Relafen 750 mg daily. 4. Macrobid 100 mg daily. 5. Plavix 75 mg daily. 6. Lopressor 25 mg one twice a day.  DISCHARGE INSTRUCTIONS:  She is to have a 2D echocardiogram scheduled at Dr. Star Age office.  Activity as tolerated, no heavy lifting. Low-cholesterol, diabetic diet.  To watch groin  for any bleeding.  She has a scheduled appointment with Dr. Arlyce Dice in two to three weeks for routine health care and see Dr. Arlyce Dice for abnormal chest x-ray.  Also, follow up with Dr. Aleen Campi for appointment in two weeks. DD:  12/27/99 TD:  12/27/99 Job: [id number]

## 2011-01-17 NOTE — Op Note (Signed)
NAMEWINNIE, [name] [facility]                       ACCOUNT NO.:  [id number]   MEDICAL RECORD NO.:  [id number]                   PATIENT TYPE:  INP   LOCATION:  5027                                 FACILITY:  [facility]   PHYSICIAN:  Danae Orleans. [name], M.D.               DATE OF BIRTH:  July 24, 1927   DATE OF PROCEDURE:  10/26/2003  DATE OF DISCHARGE:                                 OPERATIVE REPORT   PREOPERATIVE DIAGNOSIS:  L4-5 spondylolisthesis, severe spinal stenosis L4-5  with degenerative disk disease, lumbar radiculopathy.   POSTOPERATIVE DIAGNOSIS:  L4-5 spondylolisthesis, severe spinal stenosis L4-  5 with degenerative disk disease, lumbar radiculopathy.   OPERATION PERFORMED:  1. Decompressive lumbar laminectomy, L4-5 bilaterally.  2. L4-5 transverse lumbar interbody fusion with Lepird 10 mm cage with     morcellized bone autograft.  3. Pedicle screw fixation, L4-5 bilaterally.  4. Posterolateral arthrodesis with morcellized bone autograft and allograft,     platelet rich plasma.   SURGEON:  Danae Orleans. [name], M.D.   ANESTHESIA:  General endotracheal.   ESTIMATED BLOOD LOSS:  500 mL with 200 mL Cell Saver blood returned to the  patient.   COMPLICATIONS:  None.   DISPOSITION:  Recovery.   INDICATIONS FOR PROCEDURE:  Cassidy [name] is a 77 year old woman with severe  stenosis at L4-5 with mobile spondylolisthesis of L4 on L5 with bilateral L5  radiculopathies, left greater than right.  It was elected to take her to  surgery for decompression and fusion of this affected level.   DESCRIPTION OF PROCEDURE:  Ms. Deuser was brought to the operating room.  Following the satisfactory and uncomplicated induction of general  endotracheal anesthesia and placement of intravenous lines and Foley  catheter, the patient was placed in a prone position on chest rolls on the  operating table.  The low back was then prepped and draped in the usual  sterile fashion.  The area of planned incision  was infiltrated with 0.25%  Marcaine, 0.5% lidocaine, 1:200,000 epinephrine.  Incision was made in the  midline and carried through copious adipose tissue to the lumbodorsal  fascia.  Subperiosteal dissection was then performed exposing the L4 and L5  transverse processes bilaterally as well as the laminae of L4 and L5.  A  self-retaining Versatrac retractor was placed to facilitate exposure.  Using  loupe magnification and high speed drill, the L4 lamina was cut and thinned.  The facet joint on the left was entered and this was markedly spondylitic  with inflammatory tissue and considerable step off.  The hemilaminectomy was  performed on this side.  The L4 nerve root was decompressed as it extended  out the neural foramen and also the lateral aspect of the thecal sac was  decompressed.  The L5 nerve root was tethered by scar tissue and it was felt  that this was not easily mobilized.  Consequently, the right side was  decompressed and  on this side, similar decompression was performed again.  The facet joint was found to be very degenerated.  The L4 nerve root was  decompressed and the L5 nerve root was extended to the  neural foramen.  This side of the thecal sac was much more easily mobilized and using a  D'Errico nerve root retractor, the thecal sac was mobilized medially and the  interspace was incised with a 15 blade.  Disk material was removed in a  piecemeal fashion.  A laminar spreader was placed on the left to distract  the interspace and position was confirmed on intraoperative fluoroscopy.  Prior to performing laminectomy, a regular plain x-ray was obtained which  showed marker probes at the L4 and L5 transverse processes.  The DePuy  Lepird TLIF system was then used and the end plate of L4 and L5 were  stripped of residual disk material and cartilaginous material using a  variety of cross cutting end plate curets as well as other curets.  Subsequently using confirmation of  intraoperative fluoroscopy, initially an  8 mm, then a 9 mm and finally a 10 mm interspace sizer was tamped into  position in the interspace and intraoperative x-ray confirmed appropriate  size.  A 10 mm Lepird cage was then filled with morcellized autograft and  this was inserted in the interspace and countersunk appropriately.  The  position was confirmed on intraoperative fluoroscopy.  Morcellized bone  autograft was then packed overlying this spacer and tamped into position.  Subsequently using the EXPEDIUM Single Innie screw system, 40 x 6 mm screws  were placed, two at L4, two at L5.  All screws were confirmed in trajectory  and position was confirmed on AP and lateral fluoroscopy.  30 mm rods were  affixed to the heads of the screws which were locked down in situ after  copiously irrigating the wound with bacitracin saline and packing  morcellized bone allograft and autograft which was then mixed with platelet  rich plasma by the Symphony system.  The self-retaining retractor was then  removed.  The lumbodorsal fascia was closed with 1 Vicryl suture,  subcutaneous tissue was reapproximated with 2-0 Vicryl interrupted inverted  sutures and skin edges were reapproximated with interrupted 3-0 Vicryl  subcuticular stitch.  The wound was dressed benzoin and Steri-Strips, Telfa  gauze and paper tape.  The patient was extubated in the operating room and  taken to the recovery room in stable and satisfactory condition having  tolerated the operation well.  Counts were correct at the end of the case.                                               Danae Orleans. [name], M.D.    JDS/MEDQ  D:  10/26/2003  T:  10/27/2003  Job:  [id number]

## 2011-01-17 NOTE — Op Note (Signed)
Cassidy [name], Cassidy [name]               ACCOUNT NO.:  [id number]   MEDICAL RECORD NO.:  [id number]          PATIENT TYPE:  AMB   LOCATION:  DAY                          FACILITY:  [facility]   PHYSICIAN:  Cassidy [name], M.D.  DATE OF BIRTH:  [birth year]   DATE OF PROCEDURE:  07/16/2004  DATE OF DISCHARGE:                                 OPERATIVE REPORT   PREOPERATIVE DIAGNOSES:  1.  Left knee medial meniscal tear with questionable loose body in the form      of a medial meniscal tear, flat superior to the midbody of the medial      meniscus.  2.  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   POSTOPERATIVE DIAGNOSES/FINDINGS:  1.  Grade 2-3 chondromalacia within the patellofemoral compartments on both      the medial facet and apex of the patella primarily.  2.  Grade 3-4 chondromalacia within the medial compartment with small areas      of exposed bone.  3.  Degenerative complex tearing to the posterior horn and more      specifically, the midbody of the medial meniscus.  No evidence of a      loose fragment of cartilage; however, unstable tears of the cartilage      that had definitely evaginated superiorly.  4.  Grade 1-2 chondromalacia within the lateral compartment.  5.  Minimal central degenerative fraying to the lateral meniscus.   PROCEDURE:  Left knee diagnostic and operative arthroscopy with subtotal  medial meniscectomy and medial and patellofemoral chondroplasties.   SURGEON:  Cassidy [name], M.D.   ANESTHESIA:  General.   ESTIMATED BLOOD LOSS:  Minimal.   TOURNIQUET TIME:  Not used.   COMPLICATIONS:  None apparent.   INDICATIONS FOR PROCEDURE:  Cassidy [name] is a pleasant 77 year old female  who had presented to the office with right knee pain.  Further inspection  revealed joint line tenderness with associated mechanical symptoms.  Based  on these findings and the fact that radiographically she had evidence of  degenerative changes, an MRI was  confirmed to rule out a meniscal pathology.  The MRI did confirm medial meniscal tear, basically as described above.  Based on her age and despite the fact that she had some degenerative changes  present in the medial compartment as she did no radiographs appear to have  joint space preserved.  Based on this, I recommended that we perform knee  arthroscopy versus a total knee replacement in order to eliminate the  mechanical symptoms present and allow for her to tolerate the degenerative  changes present and allow [place] to manage those easier.  After reviewing these  risks and benefits, as she had had a previous right knee arthroscopy,  consent was obtained.   DESCRIPTION OF PROCEDURE:  The patient was brought to the operating theater.  Once adequate anesthesia and prophylactic antibiotics of 1 g of Ancef were  administered, the patient was positioned supine and the left lower extremity  was placed in a leg holder.  The left lower extremity was then prepped and  draped in a  sterile fashion.   Standard inferolateral, superolateral, and inferomedial portals were  utilized.  Diagnostic evaluation of the knee revealed the above-noted  findings.  The inferomedial portal was entered, and probe examination of the  medial compartment revealed the above-noted findings.  There was a very  complex degenerative nature to the medial meniscus, primarily the midbody  and posterior horn regions.  Initially, the 3/5 shaver was utilized to help  to identify the tear completely followed by utilization of the probe to free  up fragments that had flipped or evaginated medially as well as the  utilization of a biting basket in order to debride these fragments.  The  __________ shaver was used to clean up the remaining debris and further  contour the remaining anterior horn and posterior horn segments.  A subtotal  medial meniscectomy was carried out based on the amount of midbody meniscus  that was associated  with this tearing.  It was contoured into the mid-third  of the posterior horn.   The __________ shaver was then utilized in order to perform chondroplasty to  free up the loose fragments and unstable cartilage on the medial compartment  as well as the patellofemoral compartment.  Note that a limited synovectomy  was required in order for visualization of the patellofemoral as well as  medial compartment, but not a true synovectomy carried out.  Upon entering  into the lateral compartment, it was evident that there were some only minor  grade 1-2 changes in the lateral compartment, primarily in the tibial  plateau.  This was not addressed surgically.  There was some central tearing  and fraying of the lateral meniscus without discrete radial or horizontal  cleavage tear upon probe examination.  For this reason, with the __________  shaver in that compartment, I just debrided the central portion and  ___________ not significant to the operative procedure.   Following this, the knee was irrigated and reexamined to assure that there  were no further loose fragments.  Following this, the instrumentation was  removed and the knee drained.  The portals were reapproximated using 3-0  nylon.  The knee was then injected with 0.25% Marcaine with epinephrine.  The knee was then dressed in a bulky sterile dressing.   The patient was transferred to the recovery room with an ice pack on the  knee.  The patient tolerated the procedure without complications.      MDO/MEDQ  D:  07/16/2004  T:  07/16/2004  Job:  [id number]

## 2011-01-17 NOTE — Cardiovascular Report (Signed)
NAME:  Cassidy [name], Cassidy [name]                         ACCOUNT NO.:  [id number]   MEDICAL RECORD NO.:  [id number]                   PATIENT TYPE:  OIB   LOCATION:  2899                                 FACILITY:  [facility]   PHYSICIAN:  Aram Candela. Tysinger, M.D.              DATE OF BIRTH:  June 21, 1927   DATE OF PROCEDURE:  04/15/2002  DATE OF DISCHARGE:                              CARDIAC CATHETERIZATION   PROCEDURES:  1. Left heart catheterization.  2. Coronary cineangiography.  3. Left ventricular cineangiography.  4. Abdominal aortogram.   INDICATION FOR PROCEDURES:  This 77 year old female has a history of single-  vessel coronary artery disease, status post cardiac catheterization and  angioplasty with stent placement in her distal right coronary artery in  April of 2001.  She has recently had the onset of angina again with chest  pain on exertion and also awaking her at night.  She was then scheduled for  repeat catheterization because of the more similarity in her clinical  symptoms when compared to her prior episode.   DESCRIPTION OF PROCEDURE:  After signing an informed consent, the patient  was premedicated with 50 mg of Benadryl intravenously and brought to the  cardiac catheterization lab.  Her right groin was prepped and draped in a  sterile fashion and anesthetized locally with 1% lidocaine.  A 6 French  introducer sheath was inserted percutaneously into the right femoral artery.  A 6 French #4 Judkins coronary catheters were used to make injections into  the native coronary arteries.  A 6 French pigtail catheter was used to  measure pressures in the left ventricle and aorta and to make a mid stream  injection into the left ventricle and abdominal aorta.  Local injections  were made in the right femoral artery by way of the right femoral artery  sheath. The patient tolerated the procedure well and no complications were  noted. At the end of the procedure, the catheter and sheath  were removed  from her right femoral artery or right profundus artery and hemostasis was  easily obtained. She was then returned to the short-stay unit for further  monitoring prior to discharge later today.  A Perclose procedure was not  attempted because of the puncture site in the profundus artery which was  parallel to the common femoral and in the vicinity of the right femoral  nerve.   MEDICATIONS GIVEN:  None during the procedure. The blood pressure was high  at the end of the procedure, over 190, therefore she was given a bolus of  metoprolol 5 mg IV.   HEMODYNAMIC DATA:  Left ventricular pressure 186/0-13, aortic pressure  186/72 with a mean  of 121.  Left ventricular ejection fraction 60%.   CINE FINDINGS:   CORONARY CINE ANGIOGRAPHY:  Left coronary artery:  The ostium and left main  appear normal.   Left anterior descending:  The proximal and mid  LAD appear normal. The  distal LAD has moderate muscle bridging but with essentially normal  antegrade flow and good distal runoff.  The second anterolateral branch has  a segmental 50% stenosis in its proximal segment.   Circumflex coronary artery:  The circumflex is a large vessel which appears  normal.   Right coronary artery:  The right coronary artery is also large with the  ostium having a mild plaque, which is not significantly stenosed and has no  calcification. There is mild irregularity in the proximal and middle  segment.  The distal segment past a crux is the site of the prior  angioplasty with stent placement in 2001. The site of the stent now appears  normal without evidence of re-stenosis and the right coronary artery distal  to the stent has a plaque with a 50% eccentric stenosis.  Distal to this  plaque the right coronary artery appears normal and supplies the posterior  descending and a large posterolateral branch to the left ventricle.   LEFT VENTRICULAR CINEANGIOGRAM:  The left ventricular chamber size  and  contractility appear normal.  The overall left ventricular contractility is  normal with an ejection fraction of 60%. There are no segmental  abnormalities. The mitral and aortic valves appear normal.   ABDOMINAL AORTOGRAM:  The abdominal aorta appears normal without significant  plaque.  There is a mild calcified plaque at the takeoff of the left renal  artery. There is normal flow in the left renal artery.   RIGHT GROIN CINEANGIOGRAPHY:  Injection into the right femoral artery showed  normal-appearing right femoral artery with a high takeoff of the profundus  with the puncture site being in the profundus, which at that area is  parallel to the SFA. The profundus then dips into the deep femoral  distribution.   FINAL DIAGNOSES:  1. Moderate coronary artery disease with 50% stenosis in the distal right     coronary artery distal to the stent placed on December 06, 1999, 50% stenosis     of the second diagonal branch.  2. Normal-appearing right coronary stent from December 06, 1999.  3. Normal left ventricular function.  4. Normal abdominal aorta with mild calcified plaque in the left renal     artery ostium.   DISPOSITION:  Will transfer to the short-stay unit for further monitoring  prior to discharge later today. Of note, is that we did not attempt to  Perclose because of the puncture site being in the profundus branch.  We  will also schedule her for a Persantine Cardiolite to look for reversible  ischemia in the distribution of her anterolateral branch or distal right  coronary artery. If this is present, then she would be a candidate for an  intervention at that time.                                                   John R. Tysinger, M.D.    JRT/MEDQ  D:  04/15/2002  T:  04/18/2002  Job:  [id number]   cc:   Teena Irani. Arlyce Dice, M.D.   Cardiac Catheterization Laboratory

## 2011-01-17 NOTE — Cardiovascular Report (Signed)
[place]  Patient:    Cassidy [name], Cassidy [name]                      MRN: [id number] Proc. Date: 12/06/99 Adm. Date:  [id number] Attending:  Clovis Cao CC:         Jaclyn Prime. Lucas Mallow, M.D.             Cardiac Catheterization Lab                        Cardiac Catheterization  REFERRING PHYSICIAN:  Jaclyn Prime. Lucas Mallow, M.D.  PROCEDURE DONE BY:  Aram Candela. Aleen Campi, M.D.  PROCEDURES: 1. Right heart catheterization. 2. Left heart catheterization. 3. Left ventricular cineangiogram. 4. Abdominal aortogram. 5. Coronary cineangiography. 6. Angioplasty with primary stent placement in the distal right    coronary artery lesion.  INDICATION FOR PROCEDURES:  This 77 year old female was admitted with signs and symptoms of unstable angina and also had findings of a new heart murmur. She had a previous cardiac catheterization 20 years at which time she had diffuse muscle bridging of her mid left anterior descending.  She now presents with unstable angina, hypertension, and a new heart murmur.  PROCEDURE:  After signing an informed consent, the patient was premedicated with 50 mg of Benadryl intravenously and brought to the cardiac catheterization lab.  Her right groin was prepped and draped in a sterile fashion and anesthetized locally with 1% lidocaine.  An 8-French introducer sheath was inserted percutaneously into the right femoral vein.  A 6-French introducer sheath was inserted percutaneously into the right femoral artery. A 7-French Swan-Ganz catheter was inserted through the right femoral vein sheath and advanced to the right atrium, right ventricle, pulmonary artery, and wedge positions.  Pressures were recorded, and cardiac output was measured using thermodilution technique.  A 6-French pigtail catheter was used to measure pressures in the left ventricle and aorta and simultaneous recordings were made from the left ventricle and pulmonary capillary wedge  positions. Simultaneous oximetry samples were obtained from the aorta and pulmonary artery.  Injections were made into the left ventricle, root of the aorta, and abdominal aorta.  The 6-French #4 Judkins coronary catheters were used to make injections into the coronary arteries.  After noting only minor valvular disease and a new critical lesion in her distal right coronary artery, we discussed these findings and elected to proceed with an angioplasty procedure of her distal right coronary artery lesion.  We selected a 6-French JR4 guide catheter along with a short Hi-Torque floppy guide wire which were advanced to the root of the aorta.  After engaging the tip of this guide catheter in the ostium of the right coronary artery, the guide wire was advanced into the right coronary artery and through the distal lesion.  We then selected a 4.0 x 8-mm Tetra stent deployment system which was advanced over the guide wire and positioned within the lesion.  The stent was deployed with one inflation at 20 atmospheres for 33 seconds.  After this stent deployment, the balloon catheter was removed, and injection again into the right coronary artery showed an excellent angiographic result with 0% residual lesion and no evidence for dissection or clot.  There was normal antegrade flow.  The patient tolerated the procedure well, and no complications were noted.  At the end of the procedure, the catheters were removed from the right femoral vessel sheaths, and the sheaths were sutured  in place using 1-0 silk.  The patient was then transferred to the [facility] for further monitoring and continuation of the ReoPro drip per pharmacy protocol.  MEDICATIONS GIVEN:  Heparin 4000 units IV, ReoPro drip per pharmacy protocol, nitroglycerin intracoronary 200 units in the right coronary artery.  HEMODYNAMIC DATA: Right atrial pressure:  A wave 16, V wave 18, mean of 14. Right ventricular pressure:  46/17. Pulmonary  artery:  44/25 with a mean of 34. Pulmonary capillary wedge pressure: A wave 26, V wave 36, mean of 26. Left ventricular pressure:  209/20-29. Aortic pressure:  209/97 with a mean of 136. Left ventricular ejection fraction:  60%. Thermodilution cardiac output:  5.3 with a cardiac index of 2.89. Fick cardiac output:  3.3 with a cardiac index of 1.8. O2 saturation:  Aorta 94, pulmonary artery 62, AV-O2 difference 5.0.  CINE FINDINGS:  Left ventriculogram:  The left ventricular chamber size appears normal.  The left ventricular wall thickness appears normal.  The overall left ventricular contractility is normal without segmental abnormality and has an ejection fraction of 60%.  The mitral valve is very mildly abnormal with trace mitral insufficiency.  Abdominal aortogram:  Mid stream injection into the abdominal aorta shows a normal-appearing abdominal aorta and iliac arteries.  There is mild calcification at the takeoff of the left renal artery, but there is no significant stenosis in either renal artery.  Aortic root cineangiography:  Mid stream injection into the root of the aorta shows a three-cusp aortic valve with mild calcification involving all three cusps.  There is mild calcification in the ascending aorta.  Coronary cineangiography: 1. The left coronary artery:  The ostium and left main appear normal. 2. Left anterior descending artery:  There is diffuse atherosclerotic    plaque throughout the middle segment which is in the area of the prior    diffuse muscle bridging.  There is evidence of mild muscle bridging now.    The second anterolateral branch has minor plaque.  There are no    significant or severe lesions. 3. Circumflex coronary artery:  The circumflex appears normal and supplies    mainly the obtuse marginal circulation. 4. Right coronary artery:  The ostium has a minor lesion of less than 20%.    There is a 20-30% eccentric plaque in the proximal segment.  This  is    followed by minor irregularities in the middle segment.  The distal    segment past the acute angle has a focal eccentric 80% stenosis followed     by a mild plaque causing a 20-30% stenosis for a short segment.  Beyond    this, the right coronary system appears normal with a normal posterior    descending and posterolateral branch.  ANGIOPLASTY PROCEDURES:  Cines taken during angioplasty procedure shows proper positioning of the guide wire and stent deployment balloon.  Final injections into the right coronary artery shows proper placement of the stent with 0% residual lesion and no evidence of dissection or clot.  There is normal antegrade flow.  FINAL DIAGNOSES: 1. Single-vessel coronary artery disease with 80% distal right coronary artery    lesion. 2. Successful primary stent placement in the distal right coronary artery    lesion. 3. Normal left ventricular function. 4. Mild pulmonary hypertension. 5. Mildly calcified aortic valve. 6. Mild mitral insufficiency. 7. Normal abdominal aorta and iliac arteries. 8. Mild calcification of left renal artery.  DISPOSITION:  Will transfer to the [facility] for further monitoring and anticipate  discharge in the a.m. DD:  12/06/99 TD:  12/06/99 Job: [id number]

## 2011-03-03 ENCOUNTER — Telehealth: Payer: Self-pay | Admitting: *Deleted

## 2011-03-03 DIAGNOSIS — I350 Nonrheumatic aortic (valve) stenosis: Secondary | ICD-10-CM

## 2011-03-03 NOTE — Telephone Encounter (Signed)
Pt needs clearance for knee surgery, per Dr [name] needs echo 1st, echo sch for tom at 1 pm pt states she has been doing well since last appt in Jan no episodes of a-fib, no CP or SOB, will let Dr [name] know

## 2011-03-04 ENCOUNTER — Ambulatory Visit (HOSPITAL_COMMUNITY): Payer: Medicare Other | Attending: Cardiology

## 2011-03-04 DIAGNOSIS — I079 Rheumatic tricuspid valve disease, unspecified: Secondary | ICD-10-CM | POA: Insufficient documentation

## 2011-03-04 DIAGNOSIS — I080 Rheumatic disorders of both mitral and aortic valves: Secondary | ICD-10-CM | POA: Insufficient documentation

## 2011-03-04 DIAGNOSIS — E785 Hyperlipidemia, unspecified: Secondary | ICD-10-CM | POA: Insufficient documentation

## 2011-03-04 DIAGNOSIS — E119 Type 2 diabetes mellitus without complications: Secondary | ICD-10-CM | POA: Insufficient documentation

## 2011-03-04 DIAGNOSIS — I379 Nonrheumatic pulmonary valve disorder, unspecified: Secondary | ICD-10-CM | POA: Insufficient documentation

## 2011-03-04 DIAGNOSIS — I359 Nonrheumatic aortic valve disorder, unspecified: Secondary | ICD-10-CM

## 2011-03-04 DIAGNOSIS — I350 Nonrheumatic aortic (valve) stenosis: Secondary | ICD-10-CM

## 2011-04-15 ENCOUNTER — Telehealth: Payer: Self-pay | Admitting: Cardiology

## 2011-04-15 ENCOUNTER — Other Ambulatory Visit: Payer: Self-pay | Admitting: Orthopedic Surgery

## 2011-04-15 ENCOUNTER — Other Ambulatory Visit (HOSPITAL_COMMUNITY): Payer: Self-pay | Admitting: Orthopedic Surgery

## 2011-04-15 ENCOUNTER — Encounter (HOSPITAL_COMMUNITY): Payer: Medicare Other

## 2011-04-15 ENCOUNTER — Ambulatory Visit (HOSPITAL_COMMUNITY)
Admission: RE | Admit: 2011-04-15 | Discharge: 2011-04-15 | Disposition: A | Discharge status: Home / Self Care | Payer: Medicare Other | Source: Ambulatory Visit | Attending: Orthopedic Surgery | Admitting: Orthopedic Surgery

## 2011-04-15 DIAGNOSIS — I517 Cardiomegaly: Secondary | ICD-10-CM | POA: Insufficient documentation

## 2011-04-15 DIAGNOSIS — M47814 Spondylosis without myelopathy or radiculopathy, thoracic region: Secondary | ICD-10-CM | POA: Insufficient documentation

## 2011-04-15 DIAGNOSIS — Z01811 Encounter for preprocedural respiratory examination: Secondary | ICD-10-CM

## 2011-04-15 DIAGNOSIS — I4891 Unspecified atrial fibrillation: Secondary | ICD-10-CM | POA: Insufficient documentation

## 2011-04-15 DIAGNOSIS — I10 Essential (primary) hypertension: Secondary | ICD-10-CM | POA: Insufficient documentation

## 2011-04-15 DIAGNOSIS — Z01812 Encounter for preprocedural laboratory examination: Secondary | ICD-10-CM | POA: Insufficient documentation

## 2011-04-15 DIAGNOSIS — Z01818 Encounter for other preprocedural examination: Secondary | ICD-10-CM | POA: Insufficient documentation

## 2011-04-15 DIAGNOSIS — E119 Type 2 diabetes mellitus without complications: Secondary | ICD-10-CM | POA: Insufficient documentation

## 2011-04-15 DIAGNOSIS — M1710 Unilateral primary osteoarthritis, unspecified knee: Secondary | ICD-10-CM | POA: Insufficient documentation

## 2011-04-15 LAB — COMPREHENSIVE METABOLIC PANEL
ALT: 10 U/L (ref 0–35)
Alkaline Phosphatase: 69 U/L (ref 39–117)
BUN: 24 mg/dL — ABNORMAL HIGH (ref 6–23)
Chloride: 99 mEq/L (ref 96–112)
GFR calc Af Amer: >60 mL/min (ref >60)
Glucose, Bld: 168 mg/dL — ABNORMAL HIGH (ref 70–99)
Potassium: 3.8 mEq/L (ref 3.5–5.1)
Sodium: 135 mEq/L (ref 135–145)
Total Bilirubin: 0.3 mg/dL (ref 0.3–1.2)
Total Protein: 6.6 g/dL (ref 6.0–8.3)

## 2011-04-15 LAB — URINE MICROSCOPIC-ADD ON

## 2011-04-15 LAB — URINALYSIS, ROUTINE W REFLEX MICROSCOPIC
Nitrite: NEGATIVE
Protein, ur: 30 mg/dL — AB
Specific Gravity, Urine: 1.024 (ref 1.005–1.030)
Urobilinogen, UA: 0.2 mg/dL (ref 0.0–1.0)

## 2011-04-15 LAB — DIFFERENTIAL
Basophils Absolute: 0.1 10*3/uL (ref 0.0–0.1)
Basophils Relative: 1 % (ref 0–1)
Eosinophils Absolute: 0.2 10*3/uL (ref 0.0–0.7)
Eosinophils Relative: 3 % (ref 0–5)
Lymphocytes Relative: 29 % (ref 12–46)
Monocytes Absolute: 0.6 10*3/uL (ref 0.1–1.0)

## 2011-04-15 LAB — CBC
HCT: 39.6 % (ref 36.0–46.0)
MCHC: 32.6 g/dL (ref 30.0–36.0)
Platelets: 297 10*3/uL (ref 150–400)
RDW: 15.7 % — ABNORMAL HIGH (ref 11.5–15.5)
WBC: 7.9 10*3/uL (ref 4.0–10.5)

## 2011-04-15 LAB — SURGICAL PCR SCREEN: MRSA, PCR: NEGATIVE

## 2011-04-15 LAB — PROTIME-INR: Prothrombin Time: 12.9 seconds (ref 11.6–15.2)

## 2011-04-15 NOTE — Telephone Encounter (Signed)
LOV,Chest,12 faxed to [facility] @  [phone]  04/15/11/km

## 2011-04-24 ENCOUNTER — Inpatient Hospital Stay (HOSPITAL_COMMUNITY): Payer: Medicare Other

## 2011-04-24 ENCOUNTER — Inpatient Hospital Stay (HOSPITAL_COMMUNITY)
Admission: RE | Admit: 2011-04-24 | Discharge: 2011-04-29 | DRG: 470 | Disposition: A | Discharge status: Skilled Nursing Facility | Payer: Medicare Other | Source: Ambulatory Visit | Attending: Orthopedic Surgery | Admitting: Orthopedic Surgery

## 2011-04-24 DIAGNOSIS — I10 Essential (primary) hypertension: Secondary | ICD-10-CM | POA: Diagnosis present

## 2011-04-24 DIAGNOSIS — F341 Dysthymic disorder: Secondary | ICD-10-CM | POA: Diagnosis present

## 2011-04-24 DIAGNOSIS — Z01812 Encounter for preprocedural laboratory examination: Secondary | ICD-10-CM

## 2011-04-24 DIAGNOSIS — I2510 Atherosclerotic heart disease of native coronary artery without angina pectoris: Secondary | ICD-10-CM | POA: Diagnosis present

## 2011-04-24 DIAGNOSIS — Z96659 Presence of unspecified artificial knee joint: Secondary | ICD-10-CM

## 2011-04-24 DIAGNOSIS — F0280 Dementia in other diseases classified elsewhere without behavioral disturbance: Secondary | ICD-10-CM | POA: Diagnosis present

## 2011-04-24 DIAGNOSIS — R5381 Other malaise: Secondary | ICD-10-CM | POA: Diagnosis present

## 2011-04-24 DIAGNOSIS — N179 Acute kidney failure, unspecified: Secondary | ICD-10-CM | POA: Diagnosis not present

## 2011-04-24 DIAGNOSIS — E785 Hyperlipidemia, unspecified: Secondary | ICD-10-CM | POA: Diagnosis present

## 2011-04-24 DIAGNOSIS — M21869 Other specified acquired deformities of unspecified lower leg: Secondary | ICD-10-CM | POA: Diagnosis present

## 2011-04-24 DIAGNOSIS — Z9861 Coronary angioplasty status: Secondary | ICD-10-CM

## 2011-04-24 DIAGNOSIS — T424X5A Adverse effect of benzodiazepines, initial encounter: Secondary | ICD-10-CM | POA: Diagnosis present

## 2011-04-24 DIAGNOSIS — H5440 Blindness, one eye, unspecified eye: Secondary | ICD-10-CM | POA: Diagnosis present

## 2011-04-24 DIAGNOSIS — G309 Alzheimer's disease, unspecified: Secondary | ICD-10-CM | POA: Diagnosis present

## 2011-04-24 DIAGNOSIS — M1710 Unilateral primary osteoarthritis, unspecified knee: Principal | ICD-10-CM | POA: Diagnosis present

## 2011-04-24 DIAGNOSIS — R339 Retention of urine, unspecified: Secondary | ICD-10-CM | POA: Diagnosis not present

## 2011-04-24 DIAGNOSIS — E876 Hypokalemia: Secondary | ICD-10-CM | POA: Diagnosis not present

## 2011-04-24 DIAGNOSIS — I4891 Unspecified atrial fibrillation: Secondary | ICD-10-CM | POA: Diagnosis present

## 2011-04-24 DIAGNOSIS — R197 Diarrhea, unspecified: Secondary | ICD-10-CM | POA: Diagnosis not present

## 2011-04-24 DIAGNOSIS — E119 Type 2 diabetes mellitus without complications: Secondary | ICD-10-CM | POA: Diagnosis present

## 2011-04-24 LAB — TYPE AND SCREEN
ABO/RH(D): O NEG
Antibody Screen: NEGATIVE

## 2011-04-24 LAB — GLUCOSE, CAPILLARY
Glucose-Capillary: 236 mg/dL — ABNORMAL HIGH (ref 70–99)
Glucose-Capillary: 205 mg/dL — ABNORMAL HIGH (ref 70–99)
Glucose-Capillary: 228 mg/dL — ABNORMAL HIGH (ref 70–99)

## 2011-04-25 ENCOUNTER — Inpatient Hospital Stay (HOSPITAL_COMMUNITY): Payer: Medicare Other

## 2011-04-25 DIAGNOSIS — I959 Hypotension, unspecified: Secondary | ICD-10-CM

## 2011-04-25 LAB — COMPREHENSIVE METABOLIC PANEL
ALT: 9 U/L (ref 0–35)
AST: 19 U/L (ref 0–37)
Alkaline Phosphatase: 51 U/L (ref 39–117)
CO2: 28 mEq/L (ref 19–32)
Calcium: 8.8 mg/dL (ref 8.4–10.5)
Chloride: 98 mEq/L (ref 96–112)
GFR calc Af Amer: 27 mL/min — ABNORMAL LOW (ref >60)
GFR calc non Af Amer: 22 mL/min — ABNORMAL LOW (ref >60)
Glucose, Bld: 165 mg/dL — ABNORMAL HIGH (ref 70–99)
Sodium: 135 mEq/L (ref 135–145)
Total Bilirubin: 0.2 mg/dL — ABNORMAL LOW (ref 0.3–1.2)

## 2011-04-25 LAB — PROTIME-INR: INR: 1.02 (ref 0.00–1.49)

## 2011-04-25 LAB — BASIC METABOLIC PANEL
BUN: 29 mg/dL — ABNORMAL HIGH (ref 6–23)
CO2: 29 mEq/L (ref 19–32)
GFR calc non Af Amer: 39 mL/min — ABNORMAL LOW (ref >60)
Glucose, Bld: 309 mg/dL — ABNORMAL HIGH (ref 70–99)
Potassium: 5 mEq/L (ref 3.5–5.1)

## 2011-04-25 LAB — GLUCOSE, CAPILLARY
Glucose-Capillary: 255 mg/dL — ABNORMAL HIGH (ref 70–99)
Glucose-Capillary: 167 mg/dL — ABNORMAL HIGH (ref 70–99)

## 2011-04-25 LAB — HEMOGLOBIN AND HEMATOCRIT, BLOOD
HCT: 32.9 % — ABNORMAL LOW (ref 36.0–46.0)
HCT: 32.1 % — ABNORMAL LOW (ref 36.0–46.0)
Hemoglobin: 10.8 g/dL — ABNORMAL LOW (ref 12.0–15.0)

## 2011-04-25 NOTE — Op Note (Signed)
NAMEKANETRA, HO               ACCOUNT NO.:  [id number]  MEDICAL RECORD NO.:  [id number]  LOCATION:  1621                         FACILITY:  [facility]  PHYSICIAN:  Georges Lynch. Katya Rolston, M.D.DATE OF BIRTH:  [birth year]  DATE OF PROCEDURE:  04/24/2011 DATE OF DISCHARGE:                              OPERATIVE REPORT   SURGEON:  Georges Lynch. [name], M.D.  ASSISTANT:  Marlowe Kays, M.D.  PREOPERATIVE DIAGNOSIS:  Severe degenerative arthritis of the varus deformity of the left knee.  POSTOPERATIVE DIAGNOSIS:  Severe degenerative arthritis of the varus deformity of the left knee.  PROCEDURE:  Under general anesthesia, routine orthopedic prep and draping of the left lower extremity is carried out.  The appropriate time-out was carried out prior to making an incision.  Prior to that in the holding area, I marked the appropriate left leg.  At this time, leg was exsanguinated with an Esmarch.  Tourniquet was elevated at 350 mmHg. The leg was placed in the Mayo knee holder.  An incision was made over the anterior aspect of the left knee.  Bleeders identified and cauterized.  Two flaps were created.  I then reflected the patella laterally after doing the median parapatellar approach and then flexed the knee and did medial lateral meniscectomies and excised the anterior and posterior cruciate ligaments.  At this time, we did synovectomy as well.  Following that, the initial drill hole was made in the intercondylar notch.  I removed 40 mm thickness of the distal femur because of the contracture.  At this time, we proceeded to cut the remaining part of the femur for a size #2 left femur.  Following that, we then went down and approached the tibia in the usual fashion.  We measured the tibia to be a size #2.5 tray.  We made the appropriate drill hole in the tibial plateau for the intramedullary guides.  We then removed 6 mm thickness off the affected medial side because of severe varus  deformity.  The alignment guide was used to check out our overall alignment.  Following that, we then cut our keel cut out of the proximal tibia.  Note, prior to making these final cuts, we did go through our tension guides and selected a 15 mm thickness tibial insert size #2. After the appropriate tension guides were used, we then continued to make our cuts and completed the intercondylar notch cut of the femur. Trials were inserted.  We had excellent fit, good stability, and good flexion extension with a 15-mm thickness insert.  Following that, I went on and resurfaced my patella in usual fashion.  Three drill holes were made in the patella for a size #35 patella.  Following that, all trials were removed.  We thoroughly water picked out the knee, dried the knee out, cemented all three components, and simultaneously gentamicin was used in the cement.  At this particular time, we removed all loose pieces of cement, went through trials again, and finally selected a size #2, 50 mm thickness tibial insert.  Prior to inserting the final insert, I did water pick out the knee to make sure there were no other loose pieces of cement.  I then  injected 10 mL of FloSeal into the soft tissue structures.  I inserted my tibial insert and then reduced the knee and closed the knee in layers in usual fashion over Hemovac drain.  The tourniquet was let down after we closed the capsule structures.  We then closed the remaining part of the subcu with #1 and #0 Vicryl sutures. Skin was closed with metal staples.  Sterile Neosporin dressing was applied.  The patient had 2 grams of IV Ancef preop.          ______________________________ Georges Lynch. [name], M.D.     RAG/MEDQ  D:  04/24/2011  T:  04/25/2011  Job:  [id number]  Electronically Signed by Ranee Gosselin M.D. on 04/25/2011 09:00:29 AM

## 2011-04-26 ENCOUNTER — Inpatient Hospital Stay (HOSPITAL_COMMUNITY): Payer: Medicare Other

## 2011-04-26 DIAGNOSIS — I359 Nonrheumatic aortic valve disorder, unspecified: Secondary | ICD-10-CM

## 2011-04-26 LAB — GLUCOSE, CAPILLARY
Glucose-Capillary: 115 mg/dL — ABNORMAL HIGH (ref 70–99)
Glucose-Capillary: 107 mg/dL — ABNORMAL HIGH (ref 70–99)
Glucose-Capillary: 123 mg/dL — ABNORMAL HIGH (ref 70–99)
Glucose-Capillary: 146 mg/dL — ABNORMAL HIGH (ref 70–99)

## 2011-04-26 LAB — COMPREHENSIVE METABOLIC PANEL
Albumin: 2.5 g/dL — ABNORMAL LOW (ref 3.5–5.2)
Alkaline Phosphatase: 46 U/L (ref 39–117)
BUN: 36 mg/dL — ABNORMAL HIGH (ref 6–23)
Creatinine, Ser: 1.38 mg/dL — ABNORMAL HIGH (ref 0.50–1.10)
GFR calc Af Amer: 44 mL/min — ABNORMAL LOW (ref >60)
Glucose, Bld: 114 mg/dL — ABNORMAL HIGH (ref 70–99)
Potassium: 4.8 mEq/L (ref 3.5–5.1)
Total Protein: 5.7 g/dL — ABNORMAL LOW (ref 6.0–8.3)

## 2011-04-26 LAB — HEMOGLOBIN AND HEMATOCRIT, BLOOD
HCT: 28.6 % — ABNORMAL LOW (ref 36.0–46.0)
Hemoglobin: 9.2 g/dL — ABNORMAL LOW (ref 12.0–15.0)

## 2011-04-26 LAB — PROTIME-INR: Prothrombin Time: 18 seconds — ABNORMAL HIGH (ref 11.6–15.2)

## 2011-04-27 ENCOUNTER — Inpatient Hospital Stay (HOSPITAL_COMMUNITY): Payer: Medicare Other

## 2011-04-27 LAB — CBC
HCT: 28.8 % — ABNORMAL LOW (ref 36.0–46.0)
MCH: 27 pg (ref 26.0–34.0)
MCV: 83.7 fL (ref 78.0–100.0)
RBC: 3.44 MIL/uL — ABNORMAL LOW (ref 3.87–5.11)
RDW: 16.2 % — ABNORMAL HIGH (ref 11.5–15.5)
WBC: 11.3 10*3/uL — ABNORMAL HIGH (ref 4.0–10.5)

## 2011-04-27 LAB — URINALYSIS, ROUTINE W REFLEX MICROSCOPIC
Bilirubin Urine: NEGATIVE
Nitrite: NEGATIVE
Urobilinogen, UA: 0.2 mg/dL (ref 0.0–1.0)

## 2011-04-27 LAB — GLUCOSE, CAPILLARY
Glucose-Capillary: 192 mg/dL — ABNORMAL HIGH (ref 70–99)
Glucose-Capillary: 114 mg/dL — ABNORMAL HIGH (ref 70–99)

## 2011-04-27 LAB — BLOOD GAS, ARTERIAL
Bicarbonate: 26.7 mEq/L — ABNORMAL HIGH (ref 20.0–24.0)
Patient temperature: 98.6
pCO2 arterial: 49.8 mmHg — ABNORMAL HIGH (ref 35.0–45.0)
pH, Arterial: 7.348 — ABNORMAL LOW (ref 7.350–7.400)

## 2011-04-27 LAB — TSH: TSH: 0.37 u[IU]/mL (ref 0.350–4.500)

## 2011-04-27 LAB — COMPREHENSIVE METABOLIC PANEL
ALT: 7 U/L (ref 0–35)
Alkaline Phosphatase: 59 U/L (ref 39–117)
BUN: 20 mg/dL (ref 6–23)
CO2: 30 mEq/L (ref 19–32)
GFR calc Af Amer: >60 mL/min (ref >60)
GFR calc non Af Amer: >60 mL/min (ref >60)
Glucose, Bld: 185 mg/dL — ABNORMAL HIGH (ref 70–99)
Potassium: 4.3 mEq/L (ref 3.5–5.1)
Sodium: 134 mEq/L — ABNORMAL LOW (ref 135–145)

## 2011-04-27 LAB — URINE MICROSCOPIC-ADD ON

## 2011-04-27 LAB — HEMOGLOBIN A1C
Hgb A1c MFr Bld: 7.2 % — ABNORMAL HIGH (ref <5.7)
Mean Plasma Glucose: 160 mg/dL — ABNORMAL HIGH (ref <117)

## 2011-04-27 LAB — LIPID PANEL: LDL Cholesterol: 141 mg/dL — ABNORMAL HIGH (ref 0–99)

## 2011-04-28 DIAGNOSIS — I4891 Unspecified atrial fibrillation: Secondary | ICD-10-CM

## 2011-04-28 LAB — COMPREHENSIVE METABOLIC PANEL
Albumin: 2.5 g/dL — ABNORMAL LOW (ref 3.5–5.2)
Alkaline Phosphatase: 61 U/L (ref 39–117)
BUN: 15 mg/dL (ref 6–23)
CO2: 33 mEq/L — ABNORMAL HIGH (ref 19–32)
Chloride: 97 mEq/L (ref 96–112)
GFR calc Af Amer: >60 mL/min (ref >60)
GFR calc non Af Amer: >60 mL/min (ref >60)
Glucose, Bld: 128 mg/dL — ABNORMAL HIGH (ref 70–99)
Potassium: 3.4 mEq/L — ABNORMAL LOW (ref 3.5–5.1)
Total Bilirubin: 0.6 mg/dL (ref 0.3–1.2)

## 2011-04-28 LAB — URINE CULTURE
Culture: NO GROWTH
Special Requests: NEGATIVE

## 2011-04-28 LAB — GLUCOSE, CAPILLARY
Glucose-Capillary: 108 mg/dL — ABNORMAL HIGH (ref 70–99)
Glucose-Capillary: 155 mg/dL — ABNORMAL HIGH (ref 70–99)

## 2011-04-29 LAB — HEMOGLOBIN AND HEMATOCRIT, BLOOD
HCT: 30 % — ABNORMAL LOW (ref 36.0–46.0)
Hemoglobin: 9.9 g/dL — ABNORMAL LOW (ref 12.0–15.0)

## 2011-04-29 LAB — COMPREHENSIVE METABOLIC PANEL
AST: 24 U/L (ref 0–37)
Albumin: 2.4 g/dL — ABNORMAL LOW (ref 3.5–5.2)
Alkaline Phosphatase: 57 U/L (ref 39–117)
BUN: 18 mg/dL (ref 6–23)
Chloride: 93 mEq/L — ABNORMAL LOW (ref 96–112)
Potassium: 2.9 mEq/L — ABNORMAL LOW (ref 3.5–5.1)
Total Bilirubin: 0.8 mg/dL (ref 0.3–1.2)

## 2011-04-29 NOTE — H&P (Signed)
Cassidy [name], Cassidy [name]               ACCOUNT NO.:  [id number]  MEDICAL RECORD NO.:  [id number]  LOCATION:                                 FACILITY:  PHYSICIAN:  Georges Lynch. [name], M.D.DATE OF BIRTH:  1927/05/24  DATE OF ADMISSION: DATE OF DISCHARGE:                             HISTORY & PHYSICAL   ADMISSION DIAGNOSIS:  Left knee osteoarthritis.  HISTORY OF PRESENT ILLNESS:  This is an 77 year old lady with a history of osteoarthritis in the left knee with failure of conservative treatment to alleviate her symptoms.  She had a previous total knee arthroplasty in the right knee with good result and now wishes to proceed with total knee arthroplasty on the left knee.  The surgery's benefits and aftercare were discussed with the patient.  Questions invited and answered.  She has obtain medical clearance.  Her medical doctor is Dr. Antony [name] at [facility] [place].  Cardiologist is Dr. Myrtis [name].  We do need to be careful with her with postoperative fluid management to prevent overload.  Surgery to go ahead as scheduled.  DRUG ALLERGIES:  None.  CURRENT MEDICATIONS: 1. Donepezil. 2. Aspirin 81 mg. 3. Insulin 70/30, 20 units q.a.m. and p.m. 4. Citalopram. 5. Alprazolam. 6. Lisinopril. 7. Aleve.  She will bring the dosages with her to the hospital.  SERIOUS MEDICAL ILLNESSES:  Include hypertension, diabetes, Alzheimer's, history of atrial fibrillation, and coronary artery disease with a stent.  PAST SURGICAL HISTORY:  Previous surgeries include appendectomy, tubal ligation, hysterectomy, stent placement, laminectomy x1, and kyphoplasty x1 by Dr. [name].  FAMILY HISTORY:  Positive for cancer and coronary artery disease.  SOCIAL HISTORY:  The patient is married.  She lives at home.  She does not smoke and does not drink.  Will be going home after surgery.  REVIEW OF SYSTEMS:  CENTRAL NERVOUS SYSTEM:  Positive for early Alzheimer, negative for headache.   She is blind in her left eye. PULMONARY:  Negative for shortness of breath except with exertional shortness of breath, negative for PND or orthopnea.  CARDIOVASCULAR: Positive for history of atrial fibrillation, also heart stent.  She also has a murmur.  GI:  Negative for ulcers or hepatitis.  GU: Negative for urinary tract difficulty other than occasional UTI.  MUSCULOSKELETAL: Positive in HPI.  PHYSICAL EXAMINATION:  VITAL SIGNS:  BP 122/72, pulse 68 and regular, and respirations 16. HEENT: Head normocephalic.  Nose patent.  Ears patent.  Left eye is blind, right eye has reactive pupil. NECK:  Supple without adenopathy.  Carotids 2+ without bruit. CHEST:  Clear to auscultation.  No rales or rhonchi.  Respirations 16. HEART:  With a regular rate and rhythm at 68 beats per minute without with a 2/6 systolic ejection murmur. ABDOMEN:  Soft with active bowel sounds.  No masses or organomegaly. NEUROLOGIC:  The patient is alert and oriented to time, place, and person.  She does have some slight memory difficulties. EXTREMITIES:  Shows the right knee status post total knee arthroplasty, left knee with 0 to 120 degree range of motion. NEUROVASCULAR:  Status intact.  Painful range of motion.  IMPRESSION:  Osteoarthritis, left knee.  PLAN:  Total knee  arthroplasty, left knee.     [name]. Chabon, P.A.   ______________________________ Georges Lynch [name], M.D.    SJC/MEDQ  D:  04/15/2011  T:  04/16/2011  Job:  [id number]  Electronically Signed by Jodene Nam P.A. on 04/27/2011 08:53:55 AM Electronically Signed by Ranee Gosselin M.D. on 04/29/2011 08:47:22 AM

## 2011-04-29 NOTE — Consult Note (Signed)
NAMETIHANNA, GOODSON               ACCOUNT NO.:  [id number]  MEDICAL RECORD NO.:  [id number]  LOCATION:  1621                         FACILITY:  [facility]  PHYSICIAN:  Noralyn Pick. Eden Emms, MD, FACCDATE OF BIRTH:  04/12/1927  DATE OF CONSULTATION: DATE OF DISCHARGE:                                CONSULTATION   Ms. Offner is an 77 year old patient, we were asked to see by Dr. [name] postop left total knee replacement.  She had a short episode of hypotension which responded to saline.  Her creatinine has increased from 0.9 to 2.2.  The patient has a history of paroxysmal atrial fibrillation.  She has refused Coumadin in the past.  She understands that she needed to be on Coumadin post need for DVT prophylaxis.  She has a distant history of coronary artery disease with previous stenting of the right coronary artery stent was patent with moderate residual disease in the diagonal and right coronary artery by catheterization in 2003.  The patient also has moderate aortic stenosis.  Her last echocardiogram done, March 04, 2011, showed a normal EF of 55% to 65%, moderate aortic stenosis with a mean gradient of 21 and a peak gradient of 38 mmHg.  The patient is not having any chest pain postoperatively.  She is not having any shortness of breath.  When I saw her, she had significant pain in her left knee.  She was undergoing physical therapy and was having a lot of problems with her weight bearing.  Her hematocrit has fallen from 39.6 to 31.7.  As indicated, she had blood pressure fall earlier in the morning to 84 systolic.  She has responded nicely to some fluid.  She is currently at 125/60.  The patient is currently not on telemetry, but by exam appears to be in normal sinus rhythm.  In short, there are no active cardiac symptoms in regard to chest pain, dyspnea, or palpitations.  Her 10-point review of systems is remarkable for significant pain in her left knee.  PAST MEDICAL  HISTORY: 1. Remarkable for aortic stenosis, moderate by echo this year. 2. Coronary disease with distant stent to the right coronary artery,     last catheterization in 2003 with patent stent, Myoview in 2009     showing no ischemia, normal LV function. 3. History of hypertension. 4. History of mildly dilated aortic root. 5. Hyperlipidemia. 6. Paroxysmal atrial fibrillation, refusing Coumadin in the past. 7. Diabetes.  ALLERGIES:  The patient is allergic to [facility].  MEDICATIONS:  Prior to admission included, 1. Metformin 500 mg b.i.d. 2. Lisinopril and hydrochlorothiazide 20/25 once a day. 3. Vitamin D. 4. Alprazolam 1 mg at bedtime. 5. Humulin insulin 70/30 two times a day. 6. Aspirin. 7. Donepezil 10 mg half tablet a day. 8. Citalopram once daily.  FAMILY HISTORY:  Not remarkable for premature coronary disease.  She is widowed.  She is sedentary.  Her daughter was in the room with her and offers good support.  She does not drink or smoke.  PHYSICAL EXAMINATION:  GENERAL:  Remarkable for an elderly female who is in pain, trying to weightbear for the first time post-total knee. VITAL SIGNS:  She appears to  be in sinus rhythm at a rate of 62 with a regular rhythm, afebrile, blood pressure currently 125/65, saturations are 100% on room air, temperature 98. HEENT:  Unremarkable. NECK:  Carotids are palpable with referred murmur.  No thyromegaly or JVP elevation. LUNGS:  Clear.  Good diaphragmatic motion.  No wheezing. HEART:  S1 and S2 with an AS murmur.  Second heart sound is preserved. No AR heard.  PMI not palpable. ABDOMEN:  Benign, bowel sounds positive.  No AAA.  No tenderness.  No bruit.  No hepatosplenomegaly, hepatojugular reflux, or tenderness. EXTREMITIES:  Distal pulses are intact with trace edema, status post total knee replacement on the left. NEUROLOGIC:  Nonfocal. SKIN:  Warm and dry muscular weakness on the left side, unable to bend leg postsurgery.  Lab  work is currently remarkable for hematocrit of 31.7 down from preop where she was 39.6, creatinine has gone from 0.99 to 2.1, potassium 4.5.  Chest x-ray has not been done postoperatively.  On the 14th prior to surgery, she had cardiomegaly with no active lung disease.  Similarly, there has been no postoperative EKG done.  The last EKG from April 21, 2010, in [place], actually showed atrial fibrillation at a rate of 70 with no ischemic changes.  IMPRESSION: 1. Hypotension, postoperatively in the setting of an 8-point     hematocrit drop.  I suspect the elevation in creatinine is related     to her period of hypotension.  This has responded to fluid.  I do     not think this was cardiac in etiology.  She has no symptoms     referable to her heart in regard to shortness of breath,     palpitations, or chest pain.  Continued use of fluid and pain     control for her knee were in order.  Serial BMETs will be in order     if her blood pressure stays above 100 systolic.  Her creatinine     should improve fairly quickly.  I suspect she is susceptible to     this in regard to her diabetes. 2. Aortic stenosis, moderate, clinically should not be significant.     No signs of congestive heart failure. 3. History of distant coronary artery disease.  Continue aspirin, the     patient is not having angina.  She should probably have a postop     EKG. 4. Anemia, post-left total knee replacement.  Replace hemoglobin above     10.  Again, I believe that the hemoglobin drop and period of     hypotension responsible for her elevated creatinine. 5. History of paroxysmal atrial fibrillation.  Currently, appears to     be in sinus clinically.  We will do a postop EKG.  She is not on     telemetry floor, so if there is any arrhythmia she will need to be     transferred  We will be happy to follow her along here in the hospital.     Theron Arista C. Eden Emms, MD, [facility]     PCN/MEDQ  D:  04/25/2011  T:   04/26/2011  Job:  [id number]  cc:   Luis Abed, MD, [facility] [address]  [address] [place] [address]  Electronically Signed by Charlton Haws MD [facility] on 04/29/2011 02:04:29 PM

## 2011-05-02 LAB — GLUCOSE, CAPILLARY
Glucose-Capillary: 172 mg/dL — ABNORMAL HIGH (ref 70–99)
Glucose-Capillary: 158 mg/dL — ABNORMAL HIGH (ref 70–99)

## 2011-05-06 NOTE — Discharge Summary (Signed)
NAMEBLANCHE, SCOVELL               ACCOUNT NO.:  [id number]  MEDICAL RECORD NO.:  [id number]  LOCATION:  1621                         FACILITY:  [facility]  PHYSICIAN:  Georges Lynch. Silva Aamodt, M.D.DATE OF BIRTH:  October 24, 1926  DATE OF ADMISSION:  04/24/2011 DATE OF DISCHARGE:                        DISCHARGE SUMMARY - REFERRING   The patient was admitted to the hospital and taken to Surgery by me.  I did a left total knee arthroplasty on her on April 24, 2011.  Postop, the main issue was urinary retention.  Basically, she did not have a problem urinating except that she did not have an adequate output.  I did call for a consult which we did have Dr. Gwenlyn Perking saw the patient in consultation, and I also called Dr. Kandis Cocking service; both helped me manage her care.  She was treated appropriately for the decrease in her urinary output.  She was maintained on the Coumadin heparin protocol as well.  On April 25, 2011, she was seen by me.  Her hemoglobin was 10.8 and hematocrit 32.9.  Her Hemovac was discontinued.  Blood pressure was 108/67 and pulse was 57.  We elected to watch her closely.  Since her output was decreased, she was given a bolus of normal saline followed by 125 mL an hour of normal saline.  She had a bladder scan that showed there was no urinary retention __________ the bladder and she finally came around and voided quite nicely.  Dr. Luis Abed help manage her cardiology standpoint.  He followed her with EKGs.  We followed her with daily C metabolic as well.  She was also monitored for her INRs. Of note, the dressing was changed several times and the wound remained fine.  There were no signs of any infection.  The progress notes to Dr. Nicholos Johns and Dr. Gwenlyn Perking, all were documented in the chart.  On April 27, 2011, she was doing much better.  She was alert and oriented.  She had one episode the night before of lethargy, but she finally came around fine.  Her hemoglobin was  9.3.  Her dressing was dry. Temperature was 99.  Sodium 134, potassium 4.3, her BUN now was 20, and her creatinine was 0.78.  Note, all during her hospital course, skilled nursing facility was planned, admission was planned and we could not let her go until she was stabilized.  She was seen again on several occasions by Dr. Gwenlyn Perking and Dr. Alanda Amass, and once again, both were all documented in the chart.  I saw her again this morning on April 29, 2011, she was hypokalemic, potassium was 2.9, but according to the nurses, she had diarrhea the entire night.  The reason for the diarrhea was she had a Fleet Enema.  She was seen by the cardiologist who ordered potassium chloride 40 mEq and then 4 hours later to give her another 40. The discharge labs were all documented in the chart.  Her screening MRSA and Staph cultures were negative.  Her initial white count was 7900 on admission, hemoglobin 12.9, hematocrit 39.6.  Normal differential.  The platelets were 297,000.  The glucose initially was 168, BUN was 24, creatinine 0.99, her sodium was  135, potassium 3.8, chloride 99.  The SGOT was 3.3, SGPT 15.  The alkaline phosphatase was 59.  The bilirubin 0.3.  She had a large amount of leukocyte esterase in her urine that she was treated with Cipro at home prior to coming in because she also has multiple __________.  Her INR initially was 0.95.  Her PT was 12.9, PTT was 35.  Note, that during her hospital course, as I mentioned, she had multiple studies done.  Initially obviously when she had a decrease in her urinary output, we did monitor her BUN and creatinine.  Her BUN jumped up to 36, and finally on April 29, 2011, her BUN came down to 18, creatinine returned to normal 0.66.  Her sodium on April 29, 2011, was 133, potassium 2.9, chloride 93, her glucose was 115.  Her vital signs all remained stable.  She remained afebrile throughout her hospital course.  Her CBCs were all monitored.  The last  CBG on April 28, 2011, was 75.  Her coagulation studies; INR on April 29, 2011, was 3.62.  PT was 28.4.  DISCHARGE DIET:  Remain on 1800-calorie diabetic diet.  DISCHARGE MEDICATIONS:  All listed in her discharge manager that I had typed up.  DISCHARGE DIAGNOSES: 1. Hypertension. 2. Acute renal failure which totally returned to normal. 3. Hyperlipidemia. 4. Paroxysmal atrial fibrillation. 5. Total knee arthroplasty of left knee. 6. Diabetes mellitus. 7. Hypokalemia secondary to diarrhea.  DISCHARGE INSTRUCTIONS: 1. She will see me in the office 2 weeks from the day of surgery for     suture removal. 2. To ambulate, full weightbearing with assistance and with a walker. 3. She have daily dressing changes. 4. She should be maintained on potassium chloride 20 mEq b.i.d. 5. She should have her potassium serum electrolytes checked __________     potassium 3 days a week for 2 weeks. 6. She should have her INR managed weekly. 7. She should remain on her Coumadin 5 mg a day and she will need to     be monitored as I mentioned her INR. 8. She should have her t.i.d. CBGs as ordered.  She remained on her     insulin protocol and will be managed by her private doctor.  DISCHARGE CONDITION:  Should be improved.          ______________________________ Georges Lynch [name], M.D.     RAG/MEDQ  D:  04/29/2011  T:  04/29/2011  Job:  [id number]  Electronically Signed by Ranee Gosselin M.D. on 05/06/2011 07:48:08 AM

## 2011-05-30 ENCOUNTER — Encounter (INDEPENDENT_AMBULATORY_CARE_PROVIDER_SITE_OTHER): Payer: Medicare Other | Admitting: Ophthalmology

## 2011-05-30 DIAGNOSIS — E11319 Type 2 diabetes mellitus with unspecified diabetic retinopathy without macular edema: Secondary | ICD-10-CM

## 2011-05-30 DIAGNOSIS — H43819 Vitreous degeneration, unspecified eye: Secondary | ICD-10-CM

## 2011-05-30 DIAGNOSIS — H2510 Age-related nuclear cataract, unspecified eye: Secondary | ICD-10-CM

## 2011-05-30 DIAGNOSIS — E11359 Type 2 diabetes mellitus with proliferative diabetic retinopathy without macular edema: Secondary | ICD-10-CM

## 2011-06-12 NOTE — Consult Note (Signed)
Cassidy [name], KREAMER NO.:  [id number]  MEDICAL RECORD NO.:  [id number]  LOCATION:  1621                         FACILITY:  [facility]  PHYSICIAN:  Rosanna Randy, MDDATE OF BIRTH:  1926/10/19  DATE OF CONSULTATION:  04/27/2011 DATE OF DISCHARGE:                                CONSULTATION   REQUESTING PHYSICIAN:  Georges Lynch. [name], M.D.  REASON FOR CONSULTATION:  Increased lethargy and help with the patient's medical problems including hypertension, diabetes, hyperlipidemia, and acute on chronic renal failure.  HISTORY OF PRESENT ILLNESS:  The patient is an 77 year old female comes in currently on her postoperative day #2 after a left knee replacement, who has been really lethargic throughout the date of August 25th and poorly responded to stimulation throughout the day.  The patient denies any fever, any chills, any chest pain, and no shortness of breath.  She reports some nausea and also mild abdominal discomfort.  She had her last dose of Vicodin around 3:30 a.m., but before that specifically on the 24th, she was having such a severe pain that she was using plenty of pain medications.  Of note, looking at the patient's laboratory report, she had worsening renal function with a GFR currently in the 37 range. 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  Due to her depression and anxiety, she is on Lexapro and also on alprazolam, last dose of her alprazolam was also on April 26, 2011.  A CT of the head was performed demonstrating no acute intracranial abnormality.  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  A chest x-ray demonstrated cardiomegaly without acute disease.  Due to the ongoing lethargy, Internal Medicine was called in order to assist helping with the management of this patient.  Of note, the patient had a history of coronary artery  disease, paroxysmal atrial fibrillation, and also a history of a moderate aortic stenosis by 2-D echo performed this year with an ejection fraction of 55% to 60%.  Cardiology is already following the patient for these issues.  ALLERGIES:  The patient is allergic to CODEINE and also to LATEX.  PAST MEDICAL HISTORY:  Significant for aortic stenosis, moderate by echo performed this year with history of coronary artery disease with a distant stent to the right coronary artery with her last cath in 2003 with a patent stent.  The patient had a Myoview in 2009 that demonstrated no ischemia and a normal left ventricular function.  2-D echo done this year demonstrated an ejection fraction of 55% to 60%. The patient has a history of hypertension, has a history of hyperlipidemia, history of diabetes type 2 on insulin, and history of paroxysmal atrial fibrillation, but she had refused to be on Coumadin in the past.  The patient also has a history of hyperlipidemia, history of anxiety/depression, and also history of a mild dementia for which she ison donepezil.  MEDICATIONS:  She is on: 1. Aleve 220 mg 2 tablets every 8 hours as needed for pain. 2. Vicodin 5/500 one to two tablets every 6 hours as needed for pain.  3. Aspirin 81 mg 1 tablet every morning. 4. Donepezil 10 mg 1 tablet every morning. 5. Docusate 100 mg 1 capsule every other day as needed. 6. Vitamin D3 over the counter 1 capsule daily. 7. Lisinopril/hydrochlorothiazide 20/25 one tablet every morning. 8. Lexapro 10 mg 1 tablet every morning. 9. Insulin 70/30, 20 units twice a day with meals. 10.Alprazolam 1 mg 1-2 tablets daily at bedtime as needed.  FAMILY HISTORY:  Noncontributory.  SOCIAL HISTORY:  The patient lives with her husband, a really sedentary life.  There is a good support coming from her daughter who is at this point in the room providing history and information of the medical conditions that the patient has.  There  is no history of alcohol.  No history of tobacco.  No history of illicit drugs.  REVIEW OF SYSTEMS:  Negative except as mentioned on HPI.  PHYSICAL EXAMINATION:  VITAL SIGNS:  Temperature 98.8, heart rate 85, respiratory rate 16, blood pressure 177/81, oxygen saturation 98% on 2 L.  The patient has a CBG of 158. GENERAL:  The patient was in no acute distress, lying on bed, breathing and appearing comfortable. HEENT:  Normocephalic.  No trauma.  Eyes:  PERRLA.  Extraocular muscles intact without icterus.  No nystagmus.  There was no ears or nostril discharges.  Mouth without erythema or exudate.  Moist mucous membranes. NECK:  Supple.  No bruits.  No thyromegaly. RESPIRATORY SYSTEM:  Clear to auscultation anteriorly.  No wheezing or crackles. HEART:  Regular rate.  Positive 2-3/6 systolic ejection murmur.  No rubs. ABDOMEN:  Soft, positive bowel sounds.  No guarding.  No rebound.  Mild exquisite suprapubic tenderness with deep palpation.  No masses were appreciated. EXTREMITIES:  There was no cyanosis or clubbing.  Trace of edema. NEUROLOGIC EXAM:  The patient was somnolent, but easily aroused, appropriately answering every single question.  Alert, awake, and oriented x3.  Cranial nerves II through XII grossly intact.  Muscle strength 4/5 bilaterally symmetrically except on her left lower extremity where she had the surgery and this decrease on her muscle strength due to poor effort. PSYCH:  A little bit flat on her mood, otherwise appropriate.  PERTINENT LABORATORY DATA:  The patient has hemoglobin/hematocrit of 9.2/28.6, a PT of 18.0, INR 1.46.  A comprehensive metabolic panel with a sodium of 134, potassium 4.8, chloride 101, bicarb 26, glucose 114, BUN 36, creatinine 1.38, GFR 37.  LFTs within normal limits except for her albumin which was 2.5.  As mentioned on HPI, the patient had a chest x-ray that demonstrated cardiomegaly without any acute infiltrates, pneumothorax, or  pleural effusion.  A CT of the head without contrast demonstrated no acute intracranial abnormality with just atrophy and chronic microvascular disease.  There is also an old right parietal infarct appreciated on this exam.  ASSESSMENT AND PLAN: 1. Lethargy/somnolence with a negative CT scan and also a negative x-     ray ruling out at this point that she had any pneumonia.  The     patient with appropriate neurologic exam and able to answer     questions without difficulties.  Most likely, this whole lethargy     and somnolence is secondary to medications, specifically     benzodiazepines and narcotics that she was getting between the 24th     and early 25th due to her pain in the presentation of a decreased     GFR due to worsening renal function.  Nonetheless, in order to be  thorough, we are going to check a TSH, we are going to check a     urinalysis with microscopy and culture, we are going to stop     narcotics and also benzodiazepines, and we are going to follow the     patient closely.  We are going to perform an ABG in order to rule     out any increase of her CO2 as the cause of her somnolence.  The     patient does not have a history of chronic obstructive pulmonary     disease, but she is obese and may have a component of obesity     hypoventilation syndrome. 2. Diabetes.  Currently due to her lethargy and somnolence, not eating     much, but with a blood sugar in the 150s range.  We are going to     continue sliding scale, but due to the decrease in the p.o. intake,     we are going to decrease the 70/30 that she is on to 10 units     b.i.d. until her eating returns to normal again. 3. Hypertension, stable for a postoperative patient.  We will hold the     lisinopril while the renal function is more impaired.  We are going     to follow her vital signs closely and we are going to use     hydralazine instead to continue providing blood pressure control. 4. Acute on  chronic renal failure, slowly improving in comparison to     the previous BMET.  We are going to perform a renal ultrasound.  We     are going to check a urinalysis.  We are going to stop ACE     inhibitors and we will follow her serum creatinine. 5. Hyperlipidemia.  We will check a fasting lipid profile.  The     patient due to her diabetes if she can tolerate it, would benefit     being on statin as an outpatient, especially if the LDL is more     than 100. 6. Mild dementia.  We are going to continue donepezil. 7. Anxiety/depression.  We are going to continue Lexapro.  We are     going to hold on the alprazolam. 8. Paroxysmal atrial fibrillation, currently receiving Coumadin as     part of the deep venous thrombosis prophylaxis for her left knee     arthroplasty which will also cover for pulmonary atrial     fibrillation and aortic stenosis/coronary artery disease per     Cardiology. 9. Total left knee replacement per orthopedic doctors.  Thank you for this consultation.  We will be following the patient along with you.     Rosanna Randy, MD     CEM/MEDQ  D:  04/27/2011  T:  04/27/2011  Job:  [id number]  Electronically Signed by Vassie Loll MD on 06/12/2011 07:44:26 AM

## 2011-06-16 LAB — BASIC METABOLIC PANEL
BUN: 15
Calcium: 9.3
GFR calc non Af Amer: 56 — ABNORMAL LOW
Glucose, Bld: 105 — ABNORMAL HIGH
Sodium: 137

## 2011-09-09 ENCOUNTER — Encounter: Payer: Self-pay | Admitting: *Deleted

## 2011-09-10 ENCOUNTER — Encounter: Payer: Self-pay | Admitting: Cardiology

## 2011-09-10 DIAGNOSIS — I25119 Atherosclerotic heart disease of native coronary artery with unspecified angina pectoris: Secondary | ICD-10-CM | POA: Insufficient documentation

## 2011-09-10 DIAGNOSIS — I10 Essential (primary) hypertension: Secondary | ICD-10-CM | POA: Insufficient documentation

## 2011-09-10 DIAGNOSIS — I77810 Thoracic aortic ectasia: Secondary | ICD-10-CM | POA: Insufficient documentation

## 2011-09-10 DIAGNOSIS — E1169 Type 2 diabetes mellitus with other specified complication: Secondary | ICD-10-CM | POA: Insufficient documentation

## 2011-09-10 DIAGNOSIS — E669 Obesity, unspecified: Secondary | ICD-10-CM | POA: Insufficient documentation

## 2011-09-10 DIAGNOSIS — IMO0002 Reserved for concepts with insufficient information to code with codable children: Secondary | ICD-10-CM | POA: Insufficient documentation

## 2011-09-10 DIAGNOSIS — K635 Polyp of colon: Secondary | ICD-10-CM | POA: Insufficient documentation

## 2011-09-10 DIAGNOSIS — I4821 Permanent atrial fibrillation: Secondary | ICD-10-CM | POA: Insufficient documentation

## 2011-09-10 DIAGNOSIS — E785 Hyperlipidemia, unspecified: Secondary | ICD-10-CM | POA: Insufficient documentation

## 2011-09-10 DIAGNOSIS — J45909 Unspecified asthma, uncomplicated: Secondary | ICD-10-CM | POA: Insufficient documentation

## 2011-09-10 DIAGNOSIS — I350 Nonrheumatic aortic (valve) stenosis: Secondary | ICD-10-CM | POA: Insufficient documentation

## 2011-09-10 DIAGNOSIS — R9430 Abnormal result of cardiovascular function study, unspecified: Secondary | ICD-10-CM | POA: Insufficient documentation

## 2011-09-10 DIAGNOSIS — K5790 Diverticulosis of intestine, part unspecified, without perforation or abscess without bleeding: Secondary | ICD-10-CM | POA: Insufficient documentation

## 2011-09-10 DIAGNOSIS — R296 Repeated falls: Secondary | ICD-10-CM | POA: Insufficient documentation

## 2011-09-11 ENCOUNTER — Ambulatory Visit (INDEPENDENT_AMBULATORY_CARE_PROVIDER_SITE_OTHER): Payer: Medicare Other | Admitting: Cardiology

## 2011-09-11 ENCOUNTER — Encounter: Payer: Self-pay | Admitting: Cardiology

## 2011-09-11 DIAGNOSIS — I2510 Atherosclerotic heart disease of native coronary artery without angina pectoris: Secondary | ICD-10-CM

## 2011-09-11 DIAGNOSIS — I4891 Unspecified atrial fibrillation: Secondary | ICD-10-CM

## 2011-09-11 DIAGNOSIS — Z01810 Encounter for preprocedural cardiovascular examination: Secondary | ICD-10-CM

## 2011-09-11 DIAGNOSIS — I350 Nonrheumatic aortic (valve) stenosis: Secondary | ICD-10-CM

## 2011-09-11 DIAGNOSIS — I359 Nonrheumatic aortic valve disorder, unspecified: Secondary | ICD-10-CM

## 2011-09-11 DIAGNOSIS — I10 Essential (primary) hypertension: Secondary | ICD-10-CM

## 2011-09-11 MED ORDER — NAPROXEN SODIUM 275 MG PO TABS: 275.0000 mg | ORAL_TABLET | Freq: Two times a day (BID) | ORAL | Status: AC

## 2011-09-11 NOTE — Assessment & Plan Note (Signed)
Atrial fibrillation rate is controlled. She is not on Coumadin as she has refused related to fear of potential bleeding in her eyes.

## 2011-09-11 NOTE — Assessment & Plan Note (Signed)
There is history of mild aortic stenosis and mild dilatation of the aortic root. It is now time to do a follow up echo to reassess aortic stenosis and reassess the aortic root size. This will be scheduled in near future.

## 2011-09-11 NOTE — Assessment & Plan Note (Signed)
Blood pressure is controlled. No change in therapy. 

## 2011-09-11 NOTE — Progress Notes (Signed)
HPI Patient is seen today to followup atrial fibrillation and aortic valve disease. She is also seen for cardiac clearance for rotator cuff surgery later in the spring. She has been stable. Atrial fibrillation is chronic. Historically she has refused Coumadin therapy for fear that she could have bleeding in her eyes. She has done well. She's not having any chest pain or shortness of breath. She had successful knee replacement and has done well.  As part of today's evaluation I have reviewed her old records carefully and I have completely updated Chronic medical record.6  Allergies  Allergen Reactions  . Hydrocodone-Acetaminophen     Current Outpatient Prescriptions  Medication Sig Dispense Refill  . ALPRAZolam (XANAX) 1 MG tablet Take 2 mg by mouth at bedtime as needed.       Marland Kitchen aspirin 325 MG tablet Take 325 mg by mouth daily.      Marland Kitchen docusate sodium (COLACE) 100 MG capsule Take 100 mg by mouth 2 (two) times daily.       Marland Kitchen donepezil (ARICEPT) 10 MG tablet Take 5 mg by mouth at bedtime as needed.        Marland Kitchen HYDROcodone-acetaminophen (VICODIN) 5-500 MG per tablet Take 1 tablet by mouth every 6 (six) hours as needed.        . insulin NPH-insulin regular (NOVOLIN 70/30) (70-30) 100 UNIT/ML injection Inject 25 Units into the skin as directed. 25 units in the morning and 15 units at night      . lisinopril-hydrochlorothiazide (PRINZIDE,ZESTORETIC) 20-25 MG per tablet Take 1 tablet by mouth daily.        . naproxen sodium (ANAPROX) 220 MG tablet Take 220 mg by mouth 2 (two) times daily with a meal.        History   Social History  . Marital Status: Married    Spouse Name: N/A    Number of Children: N/A  . Years of Education: N/A   Occupational History  . Not on file.   Social History Main Topics  . Smoking status: Never Smoker   . Smokeless tobacco: Not on file  . Alcohol Use: Not on file  . Drug Use: Not on file  . Sexually Active: Not on file   Other Topics Concern  . Not on file    Social History Narrative  . No narrative on file    Family History  Problem Relation Age of Onset  . Colon cancer      Past Medical History  Diagnosis Date  . DM (diabetes mellitus)   . CAD (coronary artery disease)     stents right coronary artery 2001  /  nuclear, March, 2009, no ischemia  . Atrial fibrillation     Coumadin is not used per the patient because of her eyes  . HTN (hypertension)   . Diverticulosis   . Colon polyp   . Ventral hernia   . Aortic stenosis     mild, echo, February, 2009  . Ascending aorta dilatation     mild, echo, February, 2009  . Asthma   . Obesity   . Blindness of left eye     related to some type of stroke in the past  . Dyslipidemia   . Ejection fraction     EF 70%, nuclear, 2009, normal, echo, 2009  . Falling episodes     Past Surgical History  Procedure Date  . Total abdominal hysterectomy     ROS   Patient denies fever, chills, headache, sweats, rash,  change in vision, change in hearing, chest pain, cough, nausea vomiting, urinary symptoms. All other systems are reviewed and are negative.  PHYSICAL EXAM  Patient is oriented to person time and place. Affect is normal. She's here with her husband. There is no jugulovenous distention. Lungs are clear. Respiratory effort is nonlabored. Cardiac exam reveals S1 and S2. There is a crescendo decrescendo murmur compatible with aortic stenosis. The abdomen is soft. There is no peripheral edema. There are no musculoskeletal deformities. There are no skin rashes.  Filed Vitals:   09/11/11 1054  BP: 130/62  Pulse: 60  Height: 5\' 2"  (1.575 m)  Weight: 192 lb (87.091 kg)    EKG  EKG is done today and reviewed by me. She has atrial fibrillation that is old. There is no significant change.  ASSESSMENT & PLAN

## 2011-09-11 NOTE — Assessment & Plan Note (Signed)
Overall the patient is stable. A followup echo is to be done to reassess her aortic valve and aortic root. I feel she does not need exercise testing before possible rotator cuff surgery. She will be cleared for this as long as we do not see anything unusual on her echo.

## 2011-09-11 NOTE — Patient Instructions (Signed)
Your physician recommends that you schedule a follow-up appointment in: 6 months  Your physician has requested that you have an echocardiogram. Echocardiography is a painless test that uses sound waves to create images of your heart. It provides your doctor with information about the size and shape of your heart and how well your heart's chambers and valves are working. This procedure takes approximately one hour. There are no restrictions for this procedure.    

## 2011-09-11 NOTE — Assessment & Plan Note (Signed)
Coronary disease is stable. She's not having any significant symptoms. She does not need any other testing at this time.

## 2011-09-30 ENCOUNTER — Other Ambulatory Visit: Payer: Self-pay | Admitting: Dermatology

## 2011-10-07 ENCOUNTER — Other Ambulatory Visit (HOSPITAL_COMMUNITY): Payer: Medicare Other | Admitting: Radiology

## 2011-10-14 ENCOUNTER — Other Ambulatory Visit (HOSPITAL_COMMUNITY): Payer: Medicare Other | Admitting: Radiology

## 2012-04-15 ENCOUNTER — Other Ambulatory Visit: Payer: Self-pay | Admitting: Orthopedic Surgery

## 2012-04-15 DIAGNOSIS — M542 Cervicalgia: Secondary | ICD-10-CM

## 2012-04-21 ENCOUNTER — Ambulatory Visit
Admission: RE | Admit: 2012-04-21 | Discharge: 2012-04-21 | Disposition: A | Discharge status: Home / Self Care | Payer: Medicare Other | Source: Ambulatory Visit | Attending: Orthopedic Surgery | Admitting: Orthopedic Surgery

## 2012-04-21 DIAGNOSIS — M542 Cervicalgia: Secondary | ICD-10-CM

## 2012-05-31 ENCOUNTER — Encounter (INDEPENDENT_AMBULATORY_CARE_PROVIDER_SITE_OTHER): Payer: Medicare Other | Admitting: Ophthalmology

## 2012-05-31 DIAGNOSIS — E11359 Type 2 diabetes mellitus with proliferative diabetic retinopathy without macular edema: Secondary | ICD-10-CM

## 2012-05-31 DIAGNOSIS — H2510 Age-related nuclear cataract, unspecified eye: Secondary | ICD-10-CM

## 2012-05-31 DIAGNOSIS — H43819 Vitreous degeneration, unspecified eye: Secondary | ICD-10-CM

## 2012-05-31 DIAGNOSIS — E11319 Type 2 diabetes mellitus with unspecified diabetic retinopathy without macular edema: Secondary | ICD-10-CM

## 2012-05-31 DIAGNOSIS — E1165 Type 2 diabetes mellitus with hyperglycemia: Secondary | ICD-10-CM

## 2012-10-11 ENCOUNTER — Encounter: Payer: Self-pay | Admitting: Cardiology

## 2012-10-11 ENCOUNTER — Ambulatory Visit (INDEPENDENT_AMBULATORY_CARE_PROVIDER_SITE_OTHER): Payer: Medicare Other | Admitting: Cardiology

## 2012-10-11 VITALS — BP 124/76 | HR 68 | Ht 62.0 in | Wt 191.0 lb

## 2012-10-11 DIAGNOSIS — I359 Nonrheumatic aortic valve disorder, unspecified: Secondary | ICD-10-CM

## 2012-10-11 DIAGNOSIS — E119 Type 2 diabetes mellitus without complications: Secondary | ICD-10-CM

## 2012-10-11 DIAGNOSIS — I350 Nonrheumatic aortic (valve) stenosis: Secondary | ICD-10-CM

## 2012-10-11 DIAGNOSIS — I2510 Atherosclerotic heart disease of native coronary artery without angina pectoris: Secondary | ICD-10-CM

## 2012-10-11 DIAGNOSIS — I77810 Thoracic aortic ectasia: Secondary | ICD-10-CM

## 2012-10-11 DIAGNOSIS — I4891 Unspecified atrial fibrillation: Secondary | ICD-10-CM

## 2012-10-11 DIAGNOSIS — I10 Essential (primary) hypertension: Secondary | ICD-10-CM

## 2012-10-11 NOTE — Assessment & Plan Note (Signed)
Blood pressures control. No change in therapy. 

## 2012-10-11 NOTE — Assessment & Plan Note (Signed)
The patient has mild aortic stenosis historically. Her last echo was 2009. I have explained to her that it is time to proceed with a followup echo. We need to reassess her aortic root and her aortic valve.

## 2012-10-11 NOTE — Progress Notes (Signed)
Patient ID: Cassidy [name], female   DOB: 1926-09-14, 79 y.o.   MRN: [id number]   HPI  Patient returns for followup of atrial fibrillation and aortic valve disease. When I saw her in January, 2013 there was question of clearance for shoulder surgery. It turns out that she actually had knee surgery and it was successful. I did want a 2-D echo at some point to reassess her aortic valve disease. This has not been done. She has aortic stenosis that has been mild. She's not having any syncope presyncope or chest pain.  Allergies  Allergen Reactions  . Hydrocodone-Acetaminophen     Current Outpatient Prescriptions  Medication Sig Dispense Refill  . ALPRAZolam (XANAX) 1 MG tablet Take 2 mg by mouth at bedtime as needed.       Marland Kitchen aspirin 325 MG tablet Take 325 mg by mouth daily.      Marland Kitchen docusate sodium (COLACE) 100 MG capsule Take 100 mg by mouth 2 (two) times daily.       Marland Kitchen donepezil (ARICEPT) 10 MG tablet Take 5 mg by mouth at bedtime as needed.        Marland Kitchen HYDROcodone-acetaminophen (VICODIN) 5-500 MG per tablet Take 1 tablet by mouth every 6 (six) hours as needed.        . insulin NPH-insulin regular (NOVOLIN 70/30) (70-30) 100 UNIT/ML injection Inject 25 Units into the skin as directed. 25 units in the morning and 15 units at night      . lisinopril-hydrochlorothiazide (PRINZIDE,ZESTORETIC) 20-25 MG per tablet Take 1 tablet by mouth daily.        . naproxen sodium (ANAPROX) 220 MG tablet Take 220 mg by mouth 2 (two) times daily with a meal.       No current facility-administered medications for this visit.    History   Social History  . Marital Status: Married    Spouse Name: N/A    Number of Children: N/A  . Years of Education: N/A   Occupational History  . Not on file.   Social History Main Topics  . Smoking status: Never Smoker   . Smokeless tobacco: Not on file  . Alcohol Use: Not on file  . Drug Use: Not on file  . Sexually Active: Not on file   Other Topics Concern  . Not on  file   Social History Narrative  . No narrative on file    Family History  Problem Relation Age of Onset  . Colon cancer      Past Medical History  Diagnosis Date  . DM (diabetes mellitus)   . CAD (coronary artery disease)     stents right coronary artery 2001  /  nuclear, March, 2009, no ischemia  . Atrial fibrillation     Coumadin is not used per the patient because of her eyes  . HTN (hypertension)   . Diverticulosis   . Colon polyp   . Ventral hernia   . Aortic stenosis     mild, echo, February, 2009  . Ascending aorta dilatation     mild, echo, February, 2009  . Asthma   . Obesity   . Blindness of left eye     related to some type of stroke in the past  . Dyslipidemia   . Ejection fraction     EF 70%, nuclear, 2009, normal, echo, 2009  . Falling episodes     Past Surgical History  Procedure Laterality Date  . Total abdominal hysterectomy  Patient Active Problem List  Diagnosis  . CAD (coronary artery disease)  . Atrial fibrillation  . HTN (hypertension)  . Diverticulosis  . Colon polyp  . Aortic stenosis  . Ascending aorta dilatation  . Asthma  . Obesity  . Blindness of left eye  . Dyslipidemia  . Ejection fraction  . DM (diabetes mellitus)  . Falling episodes  . Preop cardiovascular exam    ROS   Patient denies fever, chills, headache, sweats, rash, change in vision, change in hearing, chest pain, cough, nausea vomiting, urinary symptoms. All other systems are reviewed and are negative.  PHYSICAL EXAM  Patient is overweight but stable. She's here with her husband. She is oriented to person time and place. Affect is normal. There is no jugulovenous distention. Lungs are clear. Respiratory effort is nonlabored. Cardiac exam reveals S1 and S2. There is a 3/6 crescendo decrescendo systolic murmur. The rhythm is irregularly irregular. The abdomen is soft. She has no significant peripheral edema. She does walk with a cane. There no skin  rashes.  Filed Vitals:   10/11/12 1052  BP: 124/76  Pulse: 68  Height: 5\' 2"  (1.575 m)  Weight: 191 lb (86.637 kg)   EKG is done today and reviewed by me. She has atrial fibrillation. The rate is controlled.  ASSESSMENT & PLAN

## 2012-10-11 NOTE — Assessment & Plan Note (Signed)
We will obtain another echo. We will try carefully to assess the size of her ascending aorta.

## 2012-10-11 NOTE — Assessment & Plan Note (Signed)
The patient has small vessel disease and has blindness in one eye. This is why she is not willing to have anticoagulation for fear of bleeding in her good eye.

## 2012-10-11 NOTE — Assessment & Plan Note (Signed)
The patient has chronic atrial fibrillation. She has chosen to not have anticoagulation for fear that she will have bleeding in her eyes. She remained stable.

## 2012-10-11 NOTE — Assessment & Plan Note (Signed)
The patient has known coronary disease. She had a stent in 2001. Nuclear study 2001 Revealed no ischemia. She is stable. She does not need any exercise testing at this time.

## 2012-10-11 NOTE — Patient Instructions (Addendum)
Your physician wants you to follow-up in: 1 year.   You will receive a reminder letter in the mail two months in advance. If you don't receive a letter, please call our office to schedule the follow-up appointment.  Your physician has requested that you have an echocardiogram. Echocardiography is a painless test that uses sound waves to create images of your heart. It provides your doctor with information about the size and shape of your heart and how well your heart's chambers and valves are working. This procedure takes approximately one hour. There are no restrictions for this procedure.  Your physician recommends that you continue on your current medications as directed. Please refer to the Current Medication list given to you today.

## 2012-12-28 ENCOUNTER — Ambulatory Visit (HOSPITAL_COMMUNITY): Payer: Medicare Other | Attending: Cardiology | Admitting: Radiology

## 2012-12-28 DIAGNOSIS — I4891 Unspecified atrial fibrillation: Secondary | ICD-10-CM | POA: Insufficient documentation

## 2012-12-28 DIAGNOSIS — Z8673 Personal history of transient ischemic attack (TIA), and cerebral infarction without residual deficits: Secondary | ICD-10-CM | POA: Insufficient documentation

## 2012-12-28 DIAGNOSIS — E119 Type 2 diabetes mellitus without complications: Secondary | ICD-10-CM | POA: Insufficient documentation

## 2012-12-28 DIAGNOSIS — I359 Nonrheumatic aortic valve disorder, unspecified: Secondary | ICD-10-CM

## 2012-12-28 DIAGNOSIS — I2510 Atherosclerotic heart disease of native coronary artery without angina pectoris: Secondary | ICD-10-CM | POA: Insufficient documentation

## 2012-12-28 DIAGNOSIS — I10 Essential (primary) hypertension: Secondary | ICD-10-CM | POA: Insufficient documentation

## 2012-12-28 DIAGNOSIS — I350 Nonrheumatic aortic (valve) stenosis: Secondary | ICD-10-CM

## 2012-12-28 NOTE — Progress Notes (Signed)
Echocardiogram performed.  

## 2012-12-30 ENCOUNTER — Encounter: Payer: Self-pay | Admitting: Cardiology

## 2013-06-06 ENCOUNTER — Ambulatory Visit (INDEPENDENT_AMBULATORY_CARE_PROVIDER_SITE_OTHER): Payer: Medicare Other | Admitting: Ophthalmology

## 2013-06-06 DIAGNOSIS — H43819 Vitreous degeneration, unspecified eye: Secondary | ICD-10-CM

## 2013-06-06 DIAGNOSIS — I10 Essential (primary) hypertension: Secondary | ICD-10-CM

## 2013-06-06 DIAGNOSIS — H348192 Central retinal vein occlusion, unspecified eye, stable: Secondary | ICD-10-CM

## 2013-06-06 DIAGNOSIS — E11319 Type 2 diabetes mellitus with unspecified diabetic retinopathy without macular edema: Secondary | ICD-10-CM

## 2013-06-06 DIAGNOSIS — H35039 Hypertensive retinopathy, unspecified eye: Secondary | ICD-10-CM

## 2013-06-06 DIAGNOSIS — E1139 Type 2 diabetes mellitus with other diabetic ophthalmic complication: Secondary | ICD-10-CM

## 2013-06-06 DIAGNOSIS — H2510 Age-related nuclear cataract, unspecified eye: Secondary | ICD-10-CM

## 2013-09-06 ENCOUNTER — Encounter: Payer: Self-pay | Admitting: Gastroenterology

## 2013-09-19 ENCOUNTER — Ambulatory Visit (INDEPENDENT_AMBULATORY_CARE_PROVIDER_SITE_OTHER): Payer: Medicare Other | Admitting: Gastroenterology

## 2013-09-19 ENCOUNTER — Encounter: Payer: Self-pay | Admitting: Gastroenterology

## 2013-09-19 VITALS — BP 128/68 | HR 72 | Ht 59.75 in | Wt 180.2 lb

## 2013-09-19 DIAGNOSIS — K5900 Constipation, unspecified: Secondary | ICD-10-CM

## 2013-09-19 DIAGNOSIS — K635 Polyp of colon: Secondary | ICD-10-CM

## 2013-09-19 DIAGNOSIS — D126 Benign neoplasm of colon, unspecified: Secondary | ICD-10-CM

## 2013-09-19 DIAGNOSIS — Z800 Family history of malignant neoplasm of digestive organs: Secondary | ICD-10-CM

## 2013-09-19 MED ORDER — LINACLOTIDE 145 MCG PO CAPS: 145.0000 ug | ORAL_CAPSULE | Freq: Every day | ORAL | Status: DC

## 2013-09-19 NOTE — Assessment & Plan Note (Signed)
Although listed in the problem list I see no supporting evidence for adenomatous polyps dating back to 2004.  Most recent colonoscopy in 2009 was negative.  Plan no further followup

## 2013-09-19 NOTE — Assessment & Plan Note (Signed)
In view of multiple comorbidities including age do not plan routine colonoscopy

## 2013-09-19 NOTE — Progress Notes (Signed)
_                                                                                                                History of Present Illness:  80 year old white female With history of diabetes, coronary artery disease, atrial fibrillation, aortic stenosis referred for evaluation of constipation.  Constipation has been a life-long problem although it appears to be worsening over the past few years.  She states that she has  neglected this problem and is finally seeking assistance.  Colonoscopy was in 2003 and 2009 demonstrated diverticulosis.  She may go one to 2 weeks without a bowel movement.  She's tried various over-the-counter laxatives.  There is no history of rectal bleeding.  She has abdominal discomfort with constipation.   Past Medical History  Diagnosis Date  . DM (diabetes mellitus)   . CAD (coronary artery disease)     stents right coronary artery 2001  /  nuclear, March, 2009, no ischemia  . Atrial fibrillation     Coumadin is not used per the patient because of her eyes  . HTN (hypertension)   . Diverticulosis   . Colon polyp   . Ventral hernia   . Aortic stenosis     mild, echo, February, 2009  . Ascending aorta dilatation     mild, echo, February, 2009  . Asthma   . Obesity   . Blindness of left eye     related to some type of stroke in the past  . Dyslipidemia   . Ejection fraction     EF 70%, nuclear, 2009, normal, echo, 2009  . Falling episodes   . Kidney stones    Past Surgical History  Procedure Laterality Date  . Total abdominal hysterectomy    . Appendectomy    . Tonsillectomy     family history includes Colon cancer in her mother; Diabetes in her daughter. Current Outpatient Prescriptions  Medication Sig Dispense Refill  . amitriptyline (ELAVIL) 10 MG tablet Take 10 mg by mouth at bedtime.      Marland Kitchen aspirin 81 MG tablet Take 81 mg by mouth daily.      Marland Kitchen donepezil (ARICEPT) 10 MG tablet Take 10 mg by mouth at bedtime as needed.       Marland Kitchen  HYDROcodone-acetaminophen (VICODIN) 5-500 MG per tablet Take 1 tablet by mouth every 6 (six) hours as needed.        . insulin NPH-insulin regular (NOVOLIN 70/30) (70-30) 100 UNIT/ML injection Inject 25 Units into the skin as directed. 30 units in the morning and 15 units at night      . lisinopril-hydrochlorothiazide (PRINZIDE,ZESTORETIC) 20-25 MG per tablet Take 1 tablet by mouth daily.         No current facility-administered medications for this visit.   Allergies as of 09/19/2013 - Review Complete 09/19/2013  Allergen Reaction Noted  . Latex  09/19/2013    reports that she has never smoked. She has never used smokeless tobacco. She reports that she does not  drink alcohol or use illicit drugs.     Review of Systems: She is unsteady on her feet Pertinent positive and negative review of systems were noted in the above HPI section. All other review of systems were otherwise negative.  Vital signs were reviewed in today's medical record Physical Exam: General: Well developed , well nourished, no acute distress Skin: anicteric Head: Normocephalic and atraumatic Eyes:  sclerae anicteric, EOMI Ears: Normal auditory acuity Mouth: No deformity or lesions Neck: Supple, no masses or thyromegaly Lungs: There are a few inspiratory wheezes Heart: Regular rate and rhythm; no rubs or bruits.  There is a 2/6 early systolic murmur heard best at the left sternal border Abdomen: Soft, and non distended. No masses, hepatosplenomegaly or hernias noted. Normal Bowel sounds.  There is minimal tenderness to palpation in the left lower quadrant Rectal:deferred Musculoskeletal: Symmetrical with no gross deformities  Skin: No lesions on visible extremities Pulses:  Normal pulses noted Extremities: No clubbing, cyanosis, edema or deformities noted Neurological: Alert oriented x 4, grossly nonfocal Cervical Nodes:  No significant cervical adenopathy Inguinal Nodes: No significant inguinal  adenopathy Psychological:  Alert and cooperative. Normal mood and affect  See Assessment and Plan under Problem List

## 2013-09-19 NOTE — Patient Instructions (Signed)
You have been given some samples of Linzess,. Take one capsule 30 minutes before your first meal of the day.  Call our office back in a week to tell us how you are doing.  Please follow up with Dr. Deatra Ina in one month

## 2013-09-19 NOTE — Assessment & Plan Note (Addendum)
Constipation his life-long and is not accompanied by any ominous features.  She likely has chronic idiopathic constipation.  Last colonoscopy in 2009 demonstrated diverticulosis.  Recommendations #1 trial of Linzess 145 mcg daily #2 stool Hemoccults

## 2013-09-30 ENCOUNTER — Telehealth: Payer: Self-pay | Admitting: Gastroenterology

## 2013-09-30 NOTE — Telephone Encounter (Signed)
Pt states the linzess 145 is not working and she has not had a BM since Wednesday. Pt states she would like for Dr. Deatra Ina to do a colon because she is worried to death that she has colon cancer. Explained to pt that due to comorbities and age Dr. Deatra Ina did not really want to do colon at this time. Please advise.

## 2013-09-30 NOTE — Telephone Encounter (Signed)
Per Alonza Bogus PA pt may take 2 linzess daily to see if it helps. Pt aware. Dr. Deatra Ina please address the question about having a colonoscopy.

## 2013-10-03 ENCOUNTER — Other Ambulatory Visit: Payer: Self-pay | Admitting: *Deleted

## 2013-10-03 ENCOUNTER — Telehealth: Payer: Self-pay | Admitting: Gastroenterology

## 2013-10-03 MED ORDER — LINACLOTIDE 145 MCG PO CAPS: 145.0000 ug | ORAL_CAPSULE | Freq: Every day | ORAL | Status: DC

## 2013-10-03 NOTE — Telephone Encounter (Signed)
Schedule CT colonography (virtual colonoscopy)

## 2013-10-03 NOTE — Telephone Encounter (Signed)
Called pt to inform Linzess sent to [facility]

## 2013-10-04 NOTE — Telephone Encounter (Signed)
Called [facility] to schedule virtual colon and they state that Insurance will not cover this for screening and they will not cover it for constipation. Please advise.

## 2013-10-05 ENCOUNTER — Other Ambulatory Visit: Payer: Self-pay

## 2013-10-05 DIAGNOSIS — K5900 Constipation, unspecified: Secondary | ICD-10-CM

## 2013-10-05 NOTE — Telephone Encounter (Signed)
Schedule single contrast BE to r/o partial obstruction, then OV

## 2013-10-05 NOTE — Telephone Encounter (Signed)
Pt aware. Pt scheduled for BE at [facility] 10/11/13. Pt to arrive there at 9:15am for a 9:30am appt. Pt to complete prep and be NPO that morning. Pt given prep instructions. Pt scheduled to see Dr. Deatra Ina 10/14/13@2 :30pm. Pt aware.

## 2013-10-07 ENCOUNTER — Telehealth: Payer: Self-pay | Admitting: Gastroenterology

## 2013-10-07 NOTE — Telephone Encounter (Signed)
Pt had question regarding prep for BE. Spoke with pt and questions answered.

## 2013-10-10 ENCOUNTER — Telehealth: Payer: Self-pay | Admitting: Gastroenterology

## 2013-10-10 NOTE — Telephone Encounter (Signed)
Let pt know she can use the bottles of magnesium citrate. Pt states she just wants to cancel the BE. Appt cancelled with [facility] in radiology scheduling.

## 2013-10-11 ENCOUNTER — Ambulatory Visit (HOSPITAL_COMMUNITY): Payer: Medicare Other

## 2013-10-11 ENCOUNTER — Other Ambulatory Visit: Payer: Self-pay | Admitting: Gastroenterology

## 2013-10-11 ENCOUNTER — Telehealth: Payer: Self-pay | Admitting: Gastroenterology

## 2013-10-11 ENCOUNTER — Other Ambulatory Visit: Payer: Self-pay

## 2013-10-11 ENCOUNTER — Ambulatory Visit (HOSPITAL_COMMUNITY)
Admission: RE | Admit: 2013-10-11 | Discharge: 2013-10-11 | Disposition: A | Discharge status: Home / Self Care | Payer: Medicare Other | Source: Ambulatory Visit | Attending: Gastroenterology | Admitting: Gastroenterology

## 2013-10-11 DIAGNOSIS — Z800 Family history of malignant neoplasm of digestive organs: Secondary | ICD-10-CM | POA: Insufficient documentation

## 2013-10-11 DIAGNOSIS — K5900 Constipation, unspecified: Secondary | ICD-10-CM

## 2013-10-11 DIAGNOSIS — K5730 Diverticulosis of large intestine without perforation or abscess without bleeding: Secondary | ICD-10-CM | POA: Insufficient documentation

## 2013-10-11 DIAGNOSIS — R109 Unspecified abdominal pain: Secondary | ICD-10-CM | POA: Insufficient documentation

## 2013-10-11 NOTE — Telephone Encounter (Signed)
Pt has now decided after doing the prep and having diarrhea all night that she wants to have the BE. Pt to go to [facility] radiology now. Daughter aware and will take her to [facility].

## 2013-10-14 ENCOUNTER — Ambulatory Visit: Payer: Medicare Other | Admitting: Gastroenterology

## 2013-10-14 ENCOUNTER — Ambulatory Visit (INDEPENDENT_AMBULATORY_CARE_PROVIDER_SITE_OTHER): Payer: Medicare Other | Admitting: Gastroenterology

## 2013-10-14 ENCOUNTER — Encounter: Payer: Self-pay | Admitting: Gastroenterology

## 2013-10-14 VITALS — BP 132/68 | HR 64 | Ht 59.75 in | Wt 182.0 lb

## 2013-10-14 DIAGNOSIS — K635 Polyp of colon: Secondary | ICD-10-CM

## 2013-10-14 DIAGNOSIS — K5900 Constipation, unspecified: Secondary | ICD-10-CM

## 2013-10-14 DIAGNOSIS — D126 Benign neoplasm of colon, unspecified: Secondary | ICD-10-CM

## 2013-10-14 NOTE — Assessment & Plan Note (Signed)
Although listed in the problem list I see no supporting evidence for adenomatous polyps dating back to 2004.  Most recent colonoscopy in 2009 was negative.  Plan no further followup 

## 2013-10-14 NOTE — Assessment & Plan Note (Signed)
Constipation is probably a combination of diverticular disease and functional constipation.  Although she's had an excellent response to Linzess she is unable to afford this medication.  I have given her coupons which hopefully will reduce the cost of the medication.  I have also given her samples of amitiza.  If this also improves to be too expensive I would consider a trial of MiraLax.

## 2013-10-14 NOTE — Patient Instructions (Signed)
Follow up as needed We gave you samples of Linzess and Amitiza

## 2013-10-14 NOTE — Progress Notes (Signed)
          History of Present Illness:  The patient has returned for followup of constipation.  At her insistence she underwent a barium enema to rule out any obstructing lesions.  Diffuse diverticulosis only was seen.  On Linzess she is having a daily bowel movement.  She cannot afford this medication.    Review of Systems: Pertinent positive and negative review of systems were noted in the above HPI section. All other review of systems were otherwise negative.    Current Medications, Allergies, Past Medical History, Past Surgical History, Family History and Social History were reviewed in [place] record  Vital signs were reviewed in today's medical record. Physical Exam: General: Well developed , well nourished, no acute distress   See Assessment and Plan under Problem List

## 2013-10-18 ENCOUNTER — Ambulatory Visit: Payer: Medicare Other | Admitting: Cardiology

## 2013-11-06 ENCOUNTER — Inpatient Hospital Stay (HOSPITAL_COMMUNITY)
Admission: EM | Admit: 2013-11-06 | Discharge: 2013-11-08 | DRG: 378 | Disposition: A | Discharge status: Home / Self Care | Payer: Medicare Other | Attending: Internal Medicine | Admitting: Internal Medicine

## 2013-11-06 ENCOUNTER — Encounter (HOSPITAL_COMMUNITY): Payer: Self-pay | Admitting: Emergency Medicine

## 2013-11-06 DIAGNOSIS — D62 Acute posthemorrhagic anemia: Secondary | ICD-10-CM | POA: Diagnosis present

## 2013-11-06 DIAGNOSIS — K254 Chronic or unspecified gastric ulcer with hemorrhage: Principal | ICD-10-CM

## 2013-11-06 DIAGNOSIS — Z981 Arthrodesis status: Secondary | ICD-10-CM

## 2013-11-06 DIAGNOSIS — Z8673 Personal history of transient ischemic attack (TIA), and cerebral infarction without residual deficits: Secondary | ICD-10-CM

## 2013-11-06 DIAGNOSIS — R42 Dizziness and giddiness: Secondary | ICD-10-CM | POA: Diagnosis present

## 2013-11-06 DIAGNOSIS — Z87442 Personal history of urinary calculi: Secondary | ICD-10-CM

## 2013-11-06 DIAGNOSIS — Z794 Long term (current) use of insulin: Secondary | ICD-10-CM

## 2013-11-06 DIAGNOSIS — Z8711 Personal history of peptic ulcer disease: Secondary | ICD-10-CM

## 2013-11-06 DIAGNOSIS — I4821 Permanent atrial fibrillation: Secondary | ICD-10-CM | POA: Diagnosis present

## 2013-11-06 DIAGNOSIS — K2960 Other gastritis without bleeding: Secondary | ICD-10-CM | POA: Diagnosis present

## 2013-11-06 DIAGNOSIS — Z860100 Personal history of colon polyps, unspecified: Secondary | ICD-10-CM

## 2013-11-06 DIAGNOSIS — E1169 Type 2 diabetes mellitus with other specified complication: Secondary | ICD-10-CM | POA: Diagnosis present

## 2013-11-06 DIAGNOSIS — Z9861 Coronary angioplasty status: Secondary | ICD-10-CM

## 2013-11-06 DIAGNOSIS — K5900 Constipation, unspecified: Secondary | ICD-10-CM | POA: Diagnosis present

## 2013-11-06 DIAGNOSIS — I359 Nonrheumatic aortic valve disorder, unspecified: Secondary | ICD-10-CM | POA: Diagnosis present

## 2013-11-06 DIAGNOSIS — Z6837 Body mass index (BMI) 37.0-37.9, adult: Secondary | ICD-10-CM

## 2013-11-06 DIAGNOSIS — H5440 Blindness, one eye, unspecified eye: Secondary | ICD-10-CM | POA: Diagnosis present

## 2013-11-06 DIAGNOSIS — E785 Hyperlipidemia, unspecified: Secondary | ICD-10-CM | POA: Diagnosis present

## 2013-11-06 DIAGNOSIS — K2980 Duodenitis without bleeding: Secondary | ICD-10-CM | POA: Diagnosis present

## 2013-11-06 DIAGNOSIS — J45909 Unspecified asthma, uncomplicated: Secondary | ICD-10-CM | POA: Diagnosis present

## 2013-11-06 DIAGNOSIS — I2510 Atherosclerotic heart disease of native coronary artery without angina pectoris: Secondary | ICD-10-CM

## 2013-11-06 DIAGNOSIS — I4891 Unspecified atrial fibrillation: Secondary | ICD-10-CM

## 2013-11-06 DIAGNOSIS — Z833 Family history of diabetes mellitus: Secondary | ICD-10-CM

## 2013-11-06 DIAGNOSIS — Z800 Family history of malignant neoplasm of digestive organs: Secondary | ICD-10-CM

## 2013-11-06 DIAGNOSIS — I25119 Atherosclerotic heart disease of native coronary artery with unspecified angina pectoris: Secondary | ICD-10-CM | POA: Diagnosis present

## 2013-11-06 DIAGNOSIS — Z7982 Long term (current) use of aspirin: Secondary | ICD-10-CM

## 2013-11-06 DIAGNOSIS — E669 Obesity, unspecified: Secondary | ICD-10-CM | POA: Diagnosis present

## 2013-11-06 DIAGNOSIS — K5730 Diverticulosis of large intestine without perforation or abscess without bleeding: Secondary | ICD-10-CM | POA: Diagnosis present

## 2013-11-06 DIAGNOSIS — I10 Essential (primary) hypertension: Secondary | ICD-10-CM

## 2013-11-06 DIAGNOSIS — E119 Type 2 diabetes mellitus without complications: Secondary | ICD-10-CM

## 2013-11-06 DIAGNOSIS — K269 Duodenal ulcer, unspecified as acute or chronic, without hemorrhage or perforation: Secondary | ICD-10-CM

## 2013-11-06 DIAGNOSIS — I959 Hypotension, unspecified: Secondary | ICD-10-CM | POA: Diagnosis present

## 2013-11-06 DIAGNOSIS — K922 Gastrointestinal hemorrhage, unspecified: Secondary | ICD-10-CM

## 2013-11-06 LAB — CBC WITH DIFFERENTIAL/PLATELET
Basophils Absolute: 0.1 10*3/uL (ref 0.0–0.1)
Basophils Relative: 1 % (ref 0–1)
EOS ABS: 0.2 10*3/uL (ref 0.0–0.7)
EOS PCT: 2 % (ref 0–5)
HEMATOCRIT: 33.2 % — AB (ref 36.0–46.0)
Hemoglobin: 10.9 g/dL — ABNORMAL LOW (ref 12.0–15.0)
LYMPHS ABS: 2.3 10*3/uL (ref 0.7–4.0)
LYMPHS PCT: 24 % (ref 12–46)
MCH: 26.1 pg (ref 26.0–34.0)
MCHC: 32.8 g/dL (ref 30.0–36.0)
MCV: 79.4 fL (ref 78.0–100.0)
Monocytes Absolute: 0.7 10*3/uL (ref 0.1–1.0)
Monocytes Relative: 7 % (ref 3–12)
Neutro Abs: 6.4 10*3/uL (ref 1.7–7.7)
Neutrophils Relative %: 66 % (ref 43–77)
Platelets: 387 10*3/uL (ref 150–400)
RBC: 4.18 MIL/uL (ref 3.87–5.11)
RDW: 17.1 % — ABNORMAL HIGH (ref 11.5–15.5)
WBC: 9.6 10*3/uL (ref 4.0–10.5)

## 2013-11-06 LAB — PREPARE RBC (CROSSMATCH)

## 2013-11-06 LAB — COMPREHENSIVE METABOLIC PANEL
ALK PHOS: 63 U/L (ref 39–117)
ALT: 9 U/L (ref 0–35)
AST: 14 U/L (ref 0–37)
Albumin: 3.1 g/dL — ABNORMAL LOW (ref 3.5–5.2)
BUN: 65 mg/dL — ABNORMAL HIGH (ref 6–23)
CALCIUM: 9.8 mg/dL (ref 8.4–10.5)
CHLORIDE: 98 meq/L (ref 96–112)
CO2: 27 meq/L (ref 19–32)
Creatinine, Ser: 1.49 mg/dL — ABNORMAL HIGH (ref 0.50–1.10)
GFR, EST AFRICAN AMERICAN: 36 mL/min — AB (ref >90)
GFR, EST NON AFRICAN AMERICAN: 31 mL/min — AB (ref >90)
GLUCOSE: 78 mg/dL (ref 70–99)
POTASSIUM: 3.7 meq/L (ref 3.7–5.3)
SODIUM: 140 meq/L (ref 137–147)
Total Protein: 6.4 g/dL (ref 6.0–8.3)

## 2013-11-06 LAB — CBC
HCT: 30.3 % — ABNORMAL LOW (ref 36.0–46.0)
Hemoglobin: 9.7 g/dL — ABNORMAL LOW (ref 12.0–15.0)
MCH: 25.8 pg — ABNORMAL LOW (ref 26.0–34.0)
MCHC: 32 g/dL (ref 30.0–36.0)
MCV: 80.6 fL (ref 78.0–100.0)
Platelets: 313 10*3/uL (ref 150–400)
RBC: 3.76 MIL/uL — ABNORMAL LOW (ref 3.87–5.11)
RDW: 17.1 % — ABNORMAL HIGH (ref 11.5–15.5)
WBC: 10.7 10*3/uL — ABNORMAL HIGH (ref 4.0–10.5)

## 2013-11-06 LAB — POC OCCULT BLOOD, ED: Fecal Occult Bld: POSITIVE — AB

## 2013-11-06 MED ORDER — SODIUM CHLORIDE 0.9 % IJ SOLN
3.0000 mL | Freq: Two times a day (BID) | INTRAMUSCULAR | Status: DC
  Administered 2013-11-06: 3 mL via INTRAVENOUS

## 2013-11-06 MED ORDER — AMITRIPTYLINE HCL 10 MG PO TABS
10.0000 mg | ORAL_TABLET | Freq: Every day | ORAL | Status: DC
  Administered 2013-11-06 – 2013-11-07 (×2): 10 mg via ORAL
  Filled 2013-11-06 (×3): qty 1

## 2013-11-06 MED ORDER — PANTOPRAZOLE SODIUM 40 MG IV SOLR
40.0000 mg | Freq: Once | INTRAVENOUS | Status: AC
  Administered 2013-11-06: 40 mg via INTRAVENOUS
  Filled 2013-11-06: qty 40

## 2013-11-06 MED ORDER — SODIUM CHLORIDE 0.9 % IV SOLN
500.0000 mL | Freq: Once | INTRAVENOUS | Status: AC
  Administered 2013-11-06: 500 mL via INTRAVENOUS

## 2013-11-06 MED ORDER — PANTOPRAZOLE SODIUM 40 MG IV SOLR: 40.0000 mg | Freq: Two times a day (BID) | INTRAVENOUS | Status: DC

## 2013-11-06 MED ORDER — DIPHENHYDRAMINE HCL 25 MG PO CAPS: 25.0000 mg | ORAL_CAPSULE | Freq: Once | ORAL | Status: DC

## 2013-11-06 MED ORDER — HYDROCODONE-ACETAMINOPHEN 5-325 MG PO TABS
1.0000 | ORAL_TABLET | Freq: Every day | ORAL | Status: DC
  Administered 2013-11-06: 1 via ORAL
  Filled 2013-11-06: qty 1

## 2013-11-06 MED ORDER — ACETAMINOPHEN 650 MG RE SUPP: 650.0000 mg | Freq: Four times a day (QID) | RECTAL | Status: DC | PRN

## 2013-11-06 MED ORDER — SODIUM CHLORIDE 0.9 % IV BOLUS (SEPSIS)
500.0000 mL | Freq: Once | INTRAVENOUS | Status: AC
  Administered 2013-11-06: 500 mL via INTRAVENOUS

## 2013-11-06 MED ORDER — SODIUM CHLORIDE 0.9 % IV SOLN
INTRAVENOUS | Status: DC
  Administered 2013-11-08: 01:00:00 via INTRAVENOUS

## 2013-11-06 MED ORDER — ACETAMINOPHEN 325 MG PO TABS: 650.0000 mg | ORAL_TABLET | Freq: Four times a day (QID) | ORAL | Status: DC | PRN

## 2013-11-06 MED ORDER — INSULIN ASPART 100 UNIT/ML ~~LOC~~ SOLN
0.0000 [IU] | SUBCUTANEOUS | Status: DC
  Administered 2013-11-07 (×4): 2 [IU] via SUBCUTANEOUS
  Administered 2013-11-08: 3 [IU] via SUBCUTANEOUS
  Administered 2013-11-08 (×2): 2 [IU] via SUBCUTANEOUS

## 2013-11-06 MED ORDER — ONDANSETRON HCL 4 MG/2ML IJ SOLN: 4.0000 mg | Freq: Four times a day (QID) | INTRAMUSCULAR | Status: DC | PRN

## 2013-11-06 MED ORDER — SODIUM CHLORIDE 0.9 % IV BOLUS (SEPSIS)
1000.0000 mL | Freq: Once | INTRAVENOUS | Status: AC
  Administered 2013-11-06: 1000 mL via INTRAVENOUS

## 2013-11-06 MED ORDER — DONEPEZIL HCL 10 MG PO TABS
10.0000 mg | ORAL_TABLET | Freq: Every day | ORAL | Status: DC
  Administered 2013-11-06 – 2013-11-07 (×2): 10 mg via ORAL
  Filled 2013-11-06 (×3): qty 1

## 2013-11-06 MED ORDER — FENTANYL CITRATE 0.05 MG/ML IJ SOLN
100.0000 ug | Freq: Once | INTRAMUSCULAR | Status: AC
  Administered 2013-11-06: 100 ug via INTRAVENOUS
  Filled 2013-11-06: qty 2

## 2013-11-06 MED ORDER — ONDANSETRON HCL 4 MG PO TABS: 4.0000 mg | ORAL_TABLET | Freq: Four times a day (QID) | ORAL | Status: DC | PRN

## 2013-11-06 MED ORDER — ACETAMINOPHEN 325 MG PO TABS: 650.0000 mg | ORAL_TABLET | Freq: Once | ORAL | Status: DC

## 2013-11-06 MED ORDER — ALPRAZOLAM 0.25 MG PO TABS
0.2500 mg | ORAL_TABLET | Freq: Every day | ORAL | Status: DC
  Administered 2013-11-06 – 2013-11-07 (×2): 0.25 mg via ORAL
  Filled 2013-11-06 (×2): qty 1

## 2013-11-06 MED ORDER — PANTOPRAZOLE SODIUM 40 MG IV SOLR
8.0000 mg/h | INTRAVENOUS | Status: DC
  Administered 2013-11-06 – 2013-11-08 (×2): 8 mg/h via INTRAVENOUS
  Filled 2013-11-06 (×8): qty 80

## 2013-11-06 NOTE — ED Notes (Signed)
MD bedside to speak with patient

## 2013-11-06 NOTE — ED Notes (Signed)
She states she hs had three "tarry" stools today.  She states she sees Dr. Doretha Sou; who about a week ago ordered Barium enema r/t generalized abd. Discomfort.  She states she has never had any dark or bloody stools in the past--that this is new.  She is in no distress and is here with her husband.  She states she infrequently takes Aleve.

## 2013-11-06 NOTE — H&P (Signed)
PCP:  [name], MD    Chief Complaint:  Tarry stools HPI: Cassidy [name] is a 80 y.o. female   has a past medical history of DM (diabetes mellitus); CAD (coronary artery disease); Atrial fibrillation; HTN (hypertension); Diverticulosis; Colon polyp; Ventral hernia; Aortic stenosis; Ascending aorta dilatation; Asthma; Obesity; Blindness of left eye; Dyslipidemia; Ejection fraction; Falling episodes; and Kidney stones.   Presented with  Patient has hx of constipation and was seen for that with Dr. Deatra Ina. Last week she had a Barrium enema done at his office. This AM when she went to have a BM she noted it to be black. States she had remote hx of PUD 30-40 years ago. She uses occasional Aleve for pain once a week. She is on daily aspirin 81 mg po a day. She felt slightly light headed initially but now feels better. She had some epigastric and lower abdominal pain initially that now has resolved.  GI has been notified and will see her in AM. Denies any chest pain or shortness of breath. [facility] called for admission.   Review of Systems:    Pertinent positives include: abdominal pain, melena,  Constitutional:  No weight loss, night sweats, Fevers, chills, fatigue, weight loss  HEENT:  No headaches, Difficulty swallowing,Tooth/dental problems,Sore throat,  No sneezing, itching, ear ache, nasal congestion, post nasal drip,  Cardio-vascular:  No chest pain, Orthopnea, PND, anasarca, dizziness, palpitations.no Bilateral lower extremity swelling  GI:  No heartburn, indigestion,  nausea, vomiting, diarrhea, change in bowel habits, loss of appetite,  blood in stool, hematemesis Resp:  no shortness of breath at rest. No dyspnea on exertion, No excess mucus, no productive cough, No non-productive cough, No coughing up of blood.No change in color of mucus.No wheezing. Skin:  no rash or lesions. No jaundice GU:  no dysuria, change in color of urine, no urgency or frequency. No straining  to urinate.  No flank pain.  Musculoskeletal:  No joint pain or no joint swelling. No decreased range of motion. No back pain.  Psych:  No change in mood or affect. No depression or anxiety. No memory loss.  Neuro: no localizing neurological complaints, no tingling, no weakness, no double vision, no gait abnormality, no slurred speech, no confusion  Otherwise ROS are negative except for above, 10 systems were reviewed  Past Medical History: Past Medical History  Diagnosis Date  . DM (diabetes mellitus)   . CAD (coronary artery disease)     stents right coronary artery 2001  /  nuclear, March, 2009, no ischemia  . Atrial fibrillation     Coumadin is not used per the patient because of her eyes  . HTN (hypertension)   . Diverticulosis   . Colon polyp   . Ventral hernia   . Aortic stenosis     mild, echo, February, 2009  . Ascending aorta dilatation     mild, echo, February, 2009  . Asthma   . Obesity   . Blindness of left eye     related to some type of stroke in the past  . Dyslipidemia   . Ejection fraction     EF 70%, nuclear, 2009, normal, echo, 2009  . Falling episodes   . Kidney stones    Past Surgical History  Procedure Laterality Date  . Total abdominal hysterectomy    . Appendectomy    . Tonsillectomy       Medications: Prior to Admission medications   Medication Sig Start Date End Date Taking? Authorizing  Provider  ALPRAZolam (XANAX) 0.25 MG tablet Take 0.25 mg by mouth at bedtime.   Yes Historical Provider, MD  amitriptyline (ELAVIL) 10 MG tablet Take 10 mg by mouth at bedtime.   Yes Historical Provider, MD  aspirin 81 MG tablet Take 81 mg by mouth every morning.    Yes Historical Provider, MD  donepezil (ARICEPT) 10 MG tablet Take 10 mg by mouth at bedtime.    Yes Historical Provider, MD  HYDROcodone-acetaminophen (NORCO/VICODIN) 5-325 MG per tablet Take 1 tablet by mouth at bedtime.   Yes Historical Provider, MD  insulin NPH-insulin regular (NOVOLIN  70/30) (70-30) 100 UNIT/ML injection Inject 15-30 Units into the skin 2 (two) times daily with a meal. 30 units in the morning and 15 units at night   Yes Historical Provider, MD  lisinopril-hydrochlorothiazide (PRINZIDE,ZESTORETIC) 20-25 MG per tablet Take 1 tablet by mouth every morning.    Yes Historical Provider, MD    Allergies:   Allergies  Allergen Reactions  . Latex Other (See Comments)    Unknown    Social History:  Ambulatory with cane Lives at home with family   reports that she has never smoked. She has never used smokeless tobacco. She reports that she does not drink alcohol or use illicit drugs.   Family History: family history includes Colon cancer in her mother; Diabetes in her daughter and sister.    Physical Exam: Patient Vitals for the past 24 hrs:  BP Temp Temp src Pulse Resp SpO2 Height Weight  11/06/13 1623 102/43 mmHg 98 F (36.7 C) Oral 82 20 96 % 4\' 11"  (1.499 m) 81.647 kg (180 lb)    1. General:  in No Acute distress 2. Psychological: Alert and   Oriented 3. Head/ENT:   Moist  Mucous Membranes                          Head Non traumatic, neck supple                          Normal  Dentition 4. SKIN:   decreased Skin turgor,  Skin clean Dry and intact no rash 5. Heart: Regular rate and rhythm no Murmur, Rub or gallop 6. Lungs: Clear to auscultation bilaterally, no wheezes or crackles   7. Abdomen: Soft, slight epigastric tenderness, Non distended 8. Lower extremities: no clubbing, cyanosis, or edema 9. Neurologically Grossly intact, moving all 4 extremities equally 10. MSK: Normal range of motion Hemoccult positive, per ER rectal exam significant for maroon stool body mass index is 36.34 kg/(m^2).   Labs on Admission:   Recent Labs  11/06/13 1720  NA 140  K 3.7  CL 98  CO2 27  GLUCOSE 78  BUN 65*  CREATININE 1.49*  CALCIUM 9.8    Recent Labs  11/06/13 1720  AST 14  ALT 9  ALKPHOS 63  BILITOT <0.2*  PROT 6.4  ALBUMIN 3.1*    No results found for this basename: LIPASE, AMYLASE,  in the last 72 hours  Recent Labs  11/06/13 1720  WBC 9.6  NEUTROABS 6.4  HGB 10.9*  HCT 33.2*  MCV 79.4  PLT 387   No results found for this basename: CKTOTAL, CKMB, CKMBINDEX, TROPONINI,  in the last 72 hours No results found for this basename: TSH, T4TOTAL, FREET3, T3FREE, THYROIDAB,  in the last 72 hours No results found for this basename: VITAMINB12, FOLATE, FERRITIN, TIBC, IRON, RETICCTPCT,  in the last 72 hours Lab Results  Component Value Date   HGBA1C 7.2* 04/27/2011    Estimated Creatinine Clearance: 26 ml/min (by C-G formula based on Cr of 1.49). ABG    Component Value Date/Time   PHART 7.348* 04/27/2011 0001   HCO3 26.7* 04/27/2011 0001   TCO2 24.9 04/27/2011 0001   O2SAT 97.7 04/27/2011 0001     No results found for this basename: DDIMER          Cultures:    Component Value Date/Time   SDES URINE, CLEAN CATCH 04/27/2011 0144   SPECREQUEST IMMUNE:NORM UT SYMPT:NEG 04/27/2011 0144   CULT NO GROWTH 04/27/2011 0144   REPTSTATUS 04/28/2011 FINAL 04/27/2011 0144       Radiological Exams on Admission: No results found.  Chart has been reviewed  Assessment/Plan  80 yo F w hx of CAD and A.fib on Aspirin 81 mg po a day with remote hx PUD here with Upper GI bleed  Present on Admission:  . Upper GI bleed - Hx of PUD in remote past, symptomatic anemia, soft blood pressure will admit to stepdown. Start on protonix gtt. Repeat serial CBC if continues to drop will have low threshold for transfusion, patient actively bleeding. GI consult has been called . Atrial fibrillation - rate controlled, hold aspirin . CAD (coronary artery disease) - currently asymptomatic but given ongoing blood loss will have low threshold for transfusing. Marland Kitchen HTN (hypertension) - hold home meds given hypotension . DM (diabetes mellitus) - hold insulin given low blood sugar down to 78 and patient being NPO    Prophylaxis: SCD,   Protonix  CODE STATUS: FULL CODE  Other plan as per orders.  I have spent a total of 65 min on this admission  Svara Twyman 11/06/2013, 7:28 PM

## 2013-11-06 NOTE — ED Provider Notes (Addendum)
CSN: [id number]     Arrival date & time 11/06/13  1608 History   First MD Initiated Contact with Patient 11/06/13 1630     Chief Complaint  Patient presents with  . Melena     (Consider location/radiation/quality/duration/timing/severity/associated sxs/prior Treatment) HPI Patient developed right-sided abdominal pain this morning after eating breakfast approximately 10:30 PM and subsequently had a tarry stool. Pain is constant, mild to moderate. Nothing makes symptoms better or worse. Associated symptoms include mild lightheadedness. No vomiting or nausea no other complaint. Past Medical History  Diagnosis Date  . DM (diabetes mellitus)   . CAD (coronary artery disease)     stents right coronary artery 2001  /  nuclear, March, 2009, no ischemia  . Atrial fibrillation     Coumadin is not used per the patient because of her eyes  . HTN (hypertension)   . Diverticulosis   . Colon polyp   . Ventral hernia   . Aortic stenosis     mild, echo, February, 2009  . Ascending aorta dilatation     mild, echo, February, 2009  . Asthma   . Obesity   . Blindness of left eye     related to some type of stroke in the past  . Dyslipidemia   . Ejection fraction     EF 70%, nuclear, 2009, normal, echo, 2009  . Falling episodes   . Kidney stones    Past Surgical History  Procedure Laterality Date  . Total abdominal hysterectomy    . Appendectomy    . Tonsillectomy     tubal ligation Family History  Problem Relation Age of Onset  . Colon cancer Mother   . Diabetes Daughter     x 2   History  Substance Use Topics  . Smoking status: Never Smoker   . Smokeless tobacco: Never Used  . Alcohol Use: No   OB History   Grav Para Term Preterm Abortions TAB SAB Ect Mult Living                 Review of Systems  Constitutional: Negative.   HENT: Negative.   Respiratory: Negative.   Cardiovascular: Negative.   Gastrointestinal: Positive for abdominal pain.       Tarry stool   Musculoskeletal: Negative.   Skin: Negative.   Neurological: Positive for light-headedness.  Psychiatric/Behavioral: Negative.   All other systems reviewed and are negative.      Allergies  Latex  Home Medications   Current Outpatient Rx  Name  Route  Sig  Dispense  Refill  . amitriptyline (ELAVIL) 10 MG tablet   Oral   Take 10 mg by mouth at bedtime.         Marland Kitchen aspirin 81 MG tablet   Oral   Take 81 mg by mouth daily.         Marland Kitchen donepezil (ARICEPT) 10 MG tablet   Oral   Take 10 mg by mouth at bedtime as needed.          Marland Kitchen HYDROcodone-acetaminophen (VICODIN) 5-500 MG per tablet   Oral   Take by mouth as directed.          . insulin NPH-insulin regular (NOVOLIN 70/30) (70-30) 100 UNIT/ML injection      as directed. 30 units in the morning and 15 units at night         . Linaclotide (LINZESS) 145 MCG CAPS capsule   Oral   Take 1 capsule (145 mcg total) by mouth  daily.   30 capsule   0     samples   . lisinopril-hydrochlorothiazide (PRINZIDE,ZESTORETIC) 20-25 MG per tablet   Oral   Take 1 tablet by mouth daily.            BP 102/43  Pulse 82  Temp(Src) 98 F (36.7 C) (Oral)  Resp 20  Ht 4\' 11"  (1.499 m)  Wt 180 lb (81.647 kg)  BMI 36.34 kg/m2  SpO2 96% Physical Exam  Nursing note and vitals reviewed. Constitutional: She appears well-developed and well-nourished.  HENT:  Head: Normocephalic and atraumatic.  Eyes: Conjunctivae are normal. Pupils are equal, round, and reactive to light.  Neck: Neck supple. No tracheal deviation present. No thyromegaly present.  Cardiovascular: Normal rate.   No murmur heard. Irregularly irregular  Pulmonary/Chest: Effort normal and breath sounds normal.  Abdominal: Soft. Bowel sounds are normal. She exhibits no distension and no mass. There is tenderness. There is no rebound and no guarding.  Obese mildly tender right mid abdomen  Genitourinary: Guaiac positive stool.  Nonionic maroon stool heme-positive   Musculoskeletal: Normal range of motion. She exhibits no edema and no tenderness.  Neurological: She is alert. Coordination normal.  Skin: Skin is warm and dry. No rash noted.  Psychiatric: She has a normal mood and affect.    ED Course  Procedures (including critical care time) Labs Review Labs Reviewed - No data to display Imaging Review No results found.   EKG Interpretation None     6:50 PM pain is improved after treatment with intravenous protonix and intravenous fluids and intravenous fentanyl. Patient resting comfortably 7 PM remains hypotensive after intravenous fluid bolus. She will Results for orders placed during the hospital encounter of 11/06/13  COMPREHENSIVE METABOLIC PANEL      Result Value Ref Range   Sodium 140  137 - 147 mEq/L   Potassium 3.7  3.7 - 5.3 mEq/L   Chloride 98  96 - 112 mEq/L   CO2 27  19 - 32 mEq/L   Glucose, Bld 78  70 - 99 mg/dL   BUN 65 (*) 6 - 23 mg/dL   Creatinine, Ser 1.49 (*) 0.50 - 1.10 mg/dL   Calcium 9.8  8.4 - 10.5 mg/dL   Total Protein 6.4  6.0 - 8.3 g/dL   Albumin 3.1 (*) 3.5 - 5.2 g/dL   AST 14  0 - 37 U/L   ALT 9  0 - 35 U/L   Alkaline Phosphatase 63  39 - 117 U/L   Total Bilirubin <0.2 (*) 0.3 - 1.2 mg/dL   GFR calc non Af Amer 31 (*) >90 mL/min   GFR calc Af Amer 36 (*) >90 mL/min  CBC WITH DIFFERENTIAL      Result Value Ref Range   WBC 9.6  4.0 - 10.5 K/uL   RBC 4.18  3.87 - 5.11 MIL/uL   Hemoglobin 10.9 (*) 12.0 - 15.0 g/dL   HCT 33.2 (*) 36.0 - 46.0 %   MCV 79.4  78.0 - 100.0 fL   MCH 26.1  26.0 - 34.0 pg   MCHC 32.8  30.0 - 36.0 g/dL   RDW 17.1 (*) 11.5 - 15.5 %   Platelets 387  150 - 400 K/uL   Neutrophils Relative % 66  43 - 77 %   Neutro Abs 6.4  1.7 - 7.7 K/uL   Lymphocytes Relative 24  12 - 46 %   Lymphs Abs 2.3  0.7 - 4.0 K/uL   Monocytes Relative  7  3 - 12 %   Monocytes Absolute 0.7  0.1 - 1.0 K/uL   Eosinophils Relative 2  0 - 5 %   Eosinophils Absolute 0.2  0.0 - 0.7 K/uL   Basophils Relative 1  0  - 1 %   Basophils Absolute 0.1  0.0 - 0.1 K/uL  POC OCCULT BLOOD, ED      Result Value Ref Range   Fecal Occult Bld POSITIVE (*) NEGATIVE   Dg Colon W/cm Ltd  10/11/2013   CLINICAL DATA:  Lower abdominal pain, constipation, family history of colon cancer.  EXAM: SINGLE COLUMN BARIUM ENEMA  TECHNIQUE: Initial scout AP supine abdominal image obtained to insure adequate colon cleansing. Barium was introduced into the colon in a retrograde fashion and refluxed from the rectum to the cecum. Spot images of the colon followed by overhead radiographs were obtained.  COMPARISON:  None.  FLUOROSCOPY TIME:  2 min 27 seconds  FINDINGS: Evaluation of the scout images demonstrate some paucity of bowel gas within nondilated loops of large small bowel. Patient is status post fusion of L4-5. Surgical wiring projects within the left inguinal region. Atherosclerotic calcifications identified within the aorta.  Contrast appreciated filling the rectosigmoid colon. There is diffuse diverticulosis within the sigmoid colon extending into the proximal descending colon. There does not appear to be evidence of diverticulitis. There is no evidence of filling defects appreciated within the descending, transverse, and ascending colon regions. Evaluation of the cecum is unremarkable. The hepatic and splenic flexure regions are unremarkable. 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  IMPRESSION: Diffuse diverticulosis within the sigmoid colon and distal descending colon. Otherwise within the limitations of a single column barium enema no further focal acute abnormalities identified. 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   Electronically Signed   By: Margaree Mackintosh M.D.   On: 10/11/2013 15:32    MDM   Final diagnoses:  None   I spoke with Dr. Rikki Spearing,. Gastroenterology service will see patient in consultation. I spoke with Dr.Doutova who will arrange for inpatient stay.Anemia is chronic,. GI bleed likely upper given  melena and elevated BUN. Patient remained hypotensive. She will be admitted to step down unit Dx Acute gastroeintestinal bleed #2Abdominal pain #3 renal insufficiebncy #4 anemia #5 hypotension  CRITICAL CARE Performed by: Orlie Dakin Total critical care time: 30 minute Critical care time was exclusive of separately billable procedures and treating other patients. Critical care was necessary to treat or prevent imminent or life-threatening deterioration. Critical care was time spent personally by me on the following activities: development of treatment plan with patient and/or surrogate as well as nursing, discussions with consultants, evaluation of patient's response to treatment, examination of patient, obtaining history from patient or surrogate, ordering and performing treatments and interventions, ordering and review of laboratory studies, ordering and review of radiographic studies, pulse oximetry and re-evaluation of patient's condition.  Orlie Dakin, MD 11/06/13 Harlow Mares, MD 11/06/13 1950

## 2013-11-06 NOTE — Progress Notes (Signed)
Spoke to EDP then hospitalist about patient with GI bleed, probable upper. Still in ER. Advised --> Needs volume resuscitation, IV PPI, and placement in ICU/Step down unit ASAP. GI team will see in am (sooner if needed, if no response to aggressive resuscitation). Keep NPO for possible EGD.

## 2013-11-06 NOTE — ED Notes (Signed)
Admitting MD at bedside.

## 2013-11-06 NOTE — ED Notes (Signed)
Pt's daughter informed this Probation officer that pt had barium enema on 10/11/13, and Dr. Appointment w/ Dr. Deatra Ina on 10/14/13.

## 2013-11-07 ENCOUNTER — Encounter (HOSPITAL_COMMUNITY)
Admission: EM | Disposition: A | Discharge status: Home / Self Care | Payer: Self-pay | Source: Home / Self Care | Attending: Internal Medicine

## 2013-11-07 ENCOUNTER — Encounter (HOSPITAL_COMMUNITY): Payer: Self-pay | Admitting: *Deleted

## 2013-11-07 DIAGNOSIS — K269 Duodenal ulcer, unspecified as acute or chronic, without hemorrhage or perforation: Secondary | ICD-10-CM

## 2013-11-07 DIAGNOSIS — K254 Chronic or unspecified gastric ulcer with hemorrhage: Secondary | ICD-10-CM

## 2013-11-07 HISTORY — PX: ESOPHAGOGASTRODUODENOSCOPY: SHX5428

## 2013-11-07 LAB — GLUCOSE, CAPILLARY
GLUCOSE-CAPILLARY: 189 mg/dL — AB (ref 70–99)
GLUCOSE-CAPILLARY: 181 mg/dL — AB (ref 70–99)
Glucose-Capillary: 108 mg/dL — ABNORMAL HIGH (ref 70–99)
Glucose-Capillary: 117 mg/dL — ABNORMAL HIGH (ref 70–99)
Glucose-Capillary: 126 mg/dL — ABNORMAL HIGH (ref 70–99)
Glucose-Capillary: 183 mg/dL — ABNORMAL HIGH (ref 70–99)
Glucose-Capillary: 191 mg/dL — ABNORMAL HIGH (ref 70–99)
Glucose-Capillary: 217 mg/dL — ABNORMAL HIGH (ref 70–99)

## 2013-11-07 LAB — HEMOGLOBIN A1C
Hgb A1c MFr Bld: 7.2 % — ABNORMAL HIGH (ref <5.7)
Mean Plasma Glucose: 160 mg/dL — ABNORMAL HIGH (ref <117)

## 2013-11-07 LAB — CBC
HCT: 28.5 % — ABNORMAL LOW (ref 36.0–46.0)
HEMATOCRIT: 27.5 % — AB (ref 36.0–46.0)
HEMOGLOBIN: 8.9 g/dL — AB (ref 12.0–15.0)
Hemoglobin: 9.3 g/dL — ABNORMAL LOW (ref 12.0–15.0)
MCH: 26 pg (ref 26.0–34.0)
MCH: 26.3 pg (ref 26.0–34.0)
MCHC: 32.4 g/dL (ref 30.0–36.0)
MCHC: 32.6 g/dL (ref 30.0–36.0)
MCV: 80.4 fL (ref 78.0–100.0)
MCV: 80.5 fL (ref 78.0–100.0)
Platelets: 281 10*3/uL (ref 150–400)
Platelets: 286 10*3/uL (ref 150–400)
RBC: 3.42 MIL/uL — ABNORMAL LOW (ref 3.87–5.11)
RBC: 3.54 MIL/uL — ABNORMAL LOW (ref 3.87–5.11)
RDW: 17.3 % — ABNORMAL HIGH (ref 11.5–15.5)
RDW: 17.3 % — ABNORMAL HIGH (ref 11.5–15.5)
WBC: 8.7 10*3/uL (ref 4.0–10.5)
WBC: 6.8 10*3/uL (ref 4.0–10.5)

## 2013-11-07 LAB — COMPREHENSIVE METABOLIC PANEL
ALBUMIN: 2.6 g/dL — AB (ref 3.5–5.2)
ALT: 8 U/L (ref 0–35)
AST: 14 U/L (ref 0–37)
Alkaline Phosphatase: 51 U/L (ref 39–117)
BUN: 57 mg/dL — ABNORMAL HIGH (ref 6–23)
CALCIUM: 8.5 mg/dL (ref 8.4–10.5)
CO2: 24 meq/L (ref 19–32)
Chloride: 104 mEq/L (ref 96–112)
Creatinine, Ser: 1.3 mg/dL — ABNORMAL HIGH (ref 0.50–1.10)
GFR calc Af Amer: 42 mL/min — ABNORMAL LOW (ref >90)
GFR, EST NON AFRICAN AMERICAN: 37 mL/min — AB (ref >90)
Glucose, Bld: 112 mg/dL — ABNORMAL HIGH (ref 70–99)
Potassium: 3.7 mEq/L (ref 3.7–5.3)
SODIUM: 140 meq/L (ref 137–147)
TOTAL PROTEIN: 5.2 g/dL — AB (ref 6.0–8.3)
Total Bilirubin: 0.3 mg/dL (ref 0.3–1.2)

## 2013-11-07 LAB — MRSA PCR SCREENING: MRSA BY PCR: NEGATIVE

## 2013-11-07 LAB — TSH: TSH: 2.268 u[IU]/mL (ref 0.350–4.500)

## 2013-11-07 LAB — MAGNESIUM: Magnesium: 1.8 mg/dL (ref 1.5–2.5)

## 2013-11-07 LAB — HEMOGLOBIN AND HEMATOCRIT, BLOOD
HCT: 27.8 % — ABNORMAL LOW (ref 36.0–46.0)
Hemoglobin: 9.1 g/dL — ABNORMAL LOW (ref 12.0–15.0)

## 2013-11-07 LAB — PHOSPHORUS: PHOSPHORUS: 3.6 mg/dL (ref 2.3–4.6)

## 2013-11-07 SURGERY — EGD (ESOPHAGOGASTRODUODENOSCOPY)
Anesthesia: Moderate Sedation

## 2013-11-07 MED ORDER — MIDAZOLAM HCL 10 MG/2ML IJ SOLN
INTRAMUSCULAR | Status: DC | PRN
  Administered 2013-11-07: 2 mg via INTRAVENOUS
  Administered 2013-11-07: 1 mg via INTRAVENOUS

## 2013-11-07 MED ORDER — BUTAMBEN-TETRACAINE-BENZOCAINE 2-2-14 % EX AERO
INHALATION_SPRAY | CUTANEOUS | Status: DC | PRN
  Administered 2013-11-07: 1 via TOPICAL

## 2013-11-07 MED ORDER — FENTANYL CITRATE 0.05 MG/ML IJ SOLN
INTRAMUSCULAR | Status: AC
  Filled 2013-11-07: qty 2

## 2013-11-07 MED ORDER — FENTANYL CITRATE 0.05 MG/ML IJ SOLN
INTRAMUSCULAR | Status: DC | PRN
  Administered 2013-11-07 (×2): 25 ug via INTRAVENOUS

## 2013-11-07 MED ORDER — MIDAZOLAM HCL 10 MG/2ML IJ SOLN
INTRAMUSCULAR | Status: AC
  Filled 2013-11-07: qty 2

## 2013-11-07 MED ORDER — HYDROCODONE-ACETAMINOPHEN 5-325 MG PO TABS
1.0000 | ORAL_TABLET | ORAL | Status: DC | PRN
  Administered 2013-11-07 – 2013-11-08 (×4): 1 via ORAL
  Filled 2013-11-07 (×4): qty 1

## 2013-11-07 MED ORDER — DIPHENHYDRAMINE HCL 50 MG/ML IJ SOLN
INTRAMUSCULAR | Status: AC
  Filled 2013-11-07: qty 1

## 2013-11-07 MED ORDER — MINERAL OIL RE ENEM
1.0000 | ENEMA | Freq: Once | RECTAL | Status: AC
  Administered 2013-11-07: 1 via RECTAL
  Filled 2013-11-07: qty 1

## 2013-11-07 NOTE — Progress Notes (Signed)
[facility] PROGRESS NOTE  Cassidy [name] ZSW:[id number] DOB: May 05, 1927 DOA: 11/06/2013 PCP: [name], MD  Assessment/Plan:  . Upper GI bleed - Hx of PUD in remote past, symptomatic anemia, soft blood pressure was admitted to stepdown. Start on protonix gtt. GI consulted and underwent EGD, showed erosive gastritis with gastric ulcers and severe duodenitis. Her H&h is 8.9, will transfuse if hemoglobin drops to less than 8. Resume diet and transfer to telemetry when beds are needed.   . Atrial fibrillation - rate controlled, hold aspirin . CAD (coronary artery disease) - currently asymptomatic but given ongoing blood loss will have low threshold for transfusing. Marland Kitchen HTN (hypertension) - hold home meds given hypotension . DM (diabetes mellitus) - hold insulin givenlow cbg's  Prophylaxis: SCD, Protonix  Code Status: full code Family Communication: discussed the plan of care with the husband.  Disposition Plan: transfer to telemetry.    Consultants:  GI  Procedures: EGD was found to have erosive gastritis 2 small gastric ulcers, nonbleeding. Status post biopsies to rule  out H. pylori moderately severe duodenitis, no evidence of active bleeding    Antibiotics:  none  HPI/Subjective: Feels good. Just ate lunch.   Objective: Filed Vitals:   11/07/13 1030  BP: 109/41  Pulse: 58  Temp:   Resp:     Intake/Output Summary (Last 24 hours) at 11/07/13 1156 Last data filed at 11/07/13 0800  Gross per 24 hour  Intake 2058.34 ml  Output    450 ml  Net 1608.34 ml   Filed Weights   11/06/13 1623 11/06/13 2208  Weight: 81.647 kg (180 lb) 82.9 kg (182 lb 12.2 oz)    Exam:   General:  Alert afebrile comfortable  Cardiovascular: s1s2  Respiratory: ctab  Abdomen: soft NT ND BS+  Musculoskeletal: nopedal edema.   Data Reviewed: Basic Metabolic Panel:  Recent Labs Lab 11/06/13 1720 11/07/13 0322  NA 140 140  K 3.7 3.7  CL 98 104  CO2 27 24  GLUCOSE  78 112*  BUN 65* 57*  CREATININE 1.49* 1.30*  CALCIUM 9.8 8.5  MG  --  1.8  PHOS  --  3.6   Liver Function Tests:  Recent Labs Lab 11/06/13 1720 11/07/13 0322  AST 14 14  ALT 9 8  ALKPHOS 63 51  BILITOT <0.2* 0.3  PROT 6.4 5.2*  ALBUMIN 3.1* 2.6*   No results found for this basename: LIPASE, AMYLASE,  in the last 168 hours No results found for this basename: AMMONIA,  in the last 168 hours CBC:  Recent Labs Lab 11/06/13 1720 11/06/13 2246 11/07/13 0322 11/07/13 1055  WBC 9.6 10.7* 8.7 6.8  NEUTROABS 6.4  --   --   --   HGB 10.9* 9.7* 8.9* 9.3*  HCT 33.2* 30.3* 27.5* 28.5*  MCV 79.4 80.6 80.4 80.5  PLT 387 313 281 286   Cardiac Enzymes: No results found for this basename: CKTOTAL, CKMB, CKMBINDEX, TROPONINI,  in the last 168 hours BNP (last 3 results) No results found for this basename: PROBNP,  in the last 8760 hours CBG:  Recent Labs Lab 11/06/13 2337 11/07/13 0331 11/07/13 0808  GLUCAP 117* 108* 126*    Recent Results (from the past 240 hour(s))  MRSA PCR SCREENING     Status: None   Collection Time    11/06/13 10:15 PM      Result Value Ref Range Status   MRSA by PCR NEGATIVE  NEGATIVE Final   Comment:  The GeneXpert MRSA Assay (FDA     approved for NASAL specimens     only), is one component of a     comprehensive MRSA colonization     surveillance program. It is not     intended to diagnose MRSA     infection nor to guide or     monitor treatment for     MRSA infections.     Studies: No results found.  Scheduled Meds: . acetaminophen  650 mg Oral Once  . ALPRAZolam  0.25 mg Oral QHS  . amitriptyline  10 mg Oral QHS  . diphenhydrAMINE  25 mg Oral Once  . donepezil  10 mg Oral QHS  . insulin aspart  0-9 Units Subcutaneous 6 times per day  . [START ON 11/10/2013] pantoprazole (PROTONIX) IV  40 mg Intravenous Q12H  . sodium chloride  3 mL Intravenous Q12H   Continuous Infusions: . sodium chloride 500 mL (11/06/13 1808)  .  pantoprozole (PROTONIX) infusion 8 mg/hr (11/06/13 2232)    Active Problems:   CAD (coronary artery disease)   Atrial fibrillation   HTN (hypertension)   DM (diabetes mellitus)   Upper GI bleed   Duodenal ulcer   Gastric ulcer with hemorrhage    Time spent: 25 min    [facility] Pager [phone] If 7PM-7AM, please contact night-coverage at [email or web address], password [facility] 11/07/2013, 11:56 AM  LOS: 1 day

## 2013-11-07 NOTE — Care Management Note (Addendum)
    Page 1 of 1   11/08/2013     1:40:31 PM   CARE MANAGEMENT NOTE 11/08/2013  Patient:  Cassidy [name], Cassidy [name]   Account Number:  [id number]  Date Initiated:  11/07/2013  Documentation initiated by:  Sunday Spillers  Subjective/Objective Assessment:   80 yo female admitted with GIB. PTA lived at home with spouse.     Action/Plan:   Home when stable   Anticipated DC Date:  11/08/2013   Anticipated DC Plan:  [place]  CM consult      Choice offered to / List presented to:             Status of service:  Completed, signed off Medicare Important Message given?   (If response is "NO", the following Medicare IM given date fields will be blank) Date Medicare IM given:   Date Additional Medicare IM given:    Discharge Disposition:  HOME/SELF CARE  Per UR Regulation:  Reviewed for med. necessity/level of care/duration of stay  If discussed at [place] of Stay Meetings, dates discussed:    Comments:  11/08/13 Jolette Lana RN,BSN NCM [phone] D/C HOME NO ORDERS PUT IN FOR HH.  Hx with Arville Go if need for [facility]

## 2013-11-07 NOTE — Consult Note (Signed)
Consultation  Referring Provider:      Primary Care Physician:  [name], MD Primary Gastroenterologist:         Reason for Consultation:              HPI:   Cassidy [name] is a 80 y.o. female with multiple medical problems. She is known to Dr. Deatra Ina for history of diverticulosis and constipation. Recent barium enema c/w left sided diverticulosis. Colonoscopy in 2003 and 2009 demonstrated diverticulosis  Patient presented to ED yesterday after with passage of black tarry stools. Melena started around 10am yesterday. No associated nausea. She complains of epigastric pain. Patient takes a daily baby aspirin. Hgb 10.9, baseline is around 12. hgb has drifted to 8.9 today. She is on Protonix drip.   Past Medical History  Diagnosis Date  . DM (diabetes mellitus)   . CAD (coronary artery disease)     stents right coronary artery 2001  /  nuclear, March, 2009, no ischemia  . Atrial fibrillation     Coumadin is not used per the patient because of her eyes  . HTN (hypertension)   . Diverticulosis   . Colon polyp   . Ventral hernia   . Aortic stenosis     mild, echo, February, 2009  . Ascending aorta dilatation     mild, echo, February, 2009  . Asthma   . Obesity   . Blindness of left eye     related to some type of stroke in the past  . Dyslipidemia   . Ejection fraction     EF 70%, nuclear, 2009, normal, echo, 2009  . Falling episodes   . Kidney stones     Past Surgical History  Procedure Laterality Date  . Total abdominal hysterectomy    . Appendectomy    . Tonsillectomy      Family History  Problem Relation Age of Onset  . Colon cancer Mother   . Diabetes Daughter     x 2  . Diabetes Sister      History  Substance Use Topics  . Smoking status: Never Smoker   . Smokeless tobacco: Never Used  . Alcohol Use: No    Prior to Admission medications   Medication Sig Start Date End Date Taking? Authorizing Provider  ALPRAZolam (XANAX) 0.25 MG tablet Take  0.25 mg by mouth at bedtime.   Yes Historical Provider, MD  amitriptyline (ELAVIL) 10 MG tablet Take 10 mg by mouth at bedtime.   Yes Historical Provider, MD  aspirin 81 MG tablet Take 81 mg by mouth every morning.    Yes Historical Provider, MD  donepezil (ARICEPT) 10 MG tablet Take 10 mg by mouth at bedtime.    Yes Historical Provider, MD  HYDROcodone-acetaminophen (NORCO/VICODIN) 5-325 MG per tablet Take 1 tablet by mouth at bedtime.   Yes Historical Provider, MD  insulin NPH-insulin regular (NOVOLIN 70/30) (70-30) 100 UNIT/ML injection Inject 15-30 Units into the skin 2 (two) times daily with a meal. 30 units in the morning and 15 units at night   Yes Historical Provider, MD  lisinopril-hydrochlorothiazide (PRINZIDE,ZESTORETIC) 20-25 MG per tablet Take 1 tablet by mouth every morning.    Yes Historical Provider, MD    Current Facility-Administered Medications  Medication Dose Route Frequency Provider Last Rate Last Dose  . 0.9 %  sodium chloride infusion   Intravenous Continuous Orlie Dakin, MD 100 mL/hr at 11/06/13 1808 500 mL at 11/06/13 1808  . acetaminophen (TYLENOL) tablet 650  mg  650 mg Oral Q6H PRN Toy Baker, MD       Or  . acetaminophen (TYLENOL) suppository 650 mg  650 mg Rectal Q6H PRN Toy Baker, MD      . acetaminophen (TYLENOL) tablet 650 mg  650 mg Oral Once Toy Baker, MD      . ALPRAZolam Duanne Moron) tablet 0.25 mg  0.25 mg Oral QHS Toy Baker, MD   0.25 mg at 11/06/13 2239  . amitriptyline (ELAVIL) tablet 10 mg  10 mg Oral QHS Toy Baker, MD   10 mg at 11/06/13 2231  . diphenhydrAMINE (BENADRYL) capsule 25 mg  25 mg Oral Once Toy Baker, MD      . donepezil (ARICEPT) tablet 10 mg  10 mg Oral QHS Toy Baker, MD   10 mg at 11/06/13 2231  . HYDROcodone-acetaminophen (NORCO/VICODIN) 5-325 MG per tablet 1 tablet  1 tablet Oral Q4H PRN Toy Baker, MD   1 tablet at 11/07/13 0445  . insulin aspart (novoLOG) injection  0-9 Units  0-9 Units Subcutaneous 6 times per day Toy Baker, MD      . ondansetron (ZOFRAN) tablet 4 mg  4 mg Oral Q6H PRN Toy Baker, MD       Or  . ondansetron (ZOFRAN) injection 4 mg  4 mg Intravenous Q6H PRN Toy Baker, MD      . pantoprazole (PROTONIX) 80 mg in sodium chloride 0.9 % 250 mL infusion  8 mg/hr Intravenous Continuous Toy Baker, MD 25 mL/hr at 11/06/13 2232 8 mg/hr at 11/06/13 2232  . [START ON 11/10/2013] pantoprazole (PROTONIX) injection 40 mg  40 mg Intravenous Q12H Anastassia Doutova, MD      . sodium chloride 0.9 % injection 3 mL  3 mL Intravenous Q12H Toy Baker, MD   3 mL at 11/06/13 2215    Allergies as of 11/06/2013 - Review Complete 11/06/2013  Allergen Reaction Noted  . Latex Other (See Comments) 09/19/2013    Review of Systems:    All systems reviewed and negative except where noted in HPI.   Physical Exam:  Vital signs in last 24 hours: Temp:  [97.4 F (36.3 C)-98 F (36.7 C)] 97.4 F (36.3 C) (03/09 0800) Pulse Rate:  [65-121] 65 (03/09 0400) Resp:  [9-20] 15 (03/09 0400) BP: (92-150)/(26-58) 150/54 mmHg (03/09 0400) SpO2:  [94 %-98 %] 97 % (03/09 0400) Weight:  [180 lb (81.647 kg)-182 lb 12.2 oz (82.9 kg)] 182 lb 12.2 oz (82.9 kg) (03/08 2208) Last BM Date: 11/06/13 General:   Pleasant obese white female in NAD Head:  Normocephalic and atraumatic. Eyes:   No icterus.   Conjunctiva pink. Ears:  Normal auditory acuity. Neck:  Supple; no masses felt Lungs:  Respirations even and unlabored. Lungs clear to auscultation bilaterally.   No wheezes, crackles, or rhonchi.  Heart:  Regular rate and rhythm;  murmur heard. Abdomen:  Soft, nondistended, mild to moderate RUQ tenderness.  Normal bowel sounds. No appreciable masses or hepatomegaly.  Rectal:  Melenic stool in vault Msk:  Symmetrical without gross deformities.  Extremities:  Without edema. Neurologic:  Alert and  oriented x4;  grossly normal  neurologically. Skin:  Intact without significant lesions or rashes. Cervical Nodes:  No significant cervical adenopathy. Psych:  Alert and cooperative. Normal affect.  LAB RESULTS:  Recent Labs  11/06/13 1720 11/06/13 2246 11/07/13 0322  WBC 9.6 10.7* 8.7  HGB 10.9* 9.7* 8.9*  HCT 33.2* 30.3* 27.5*  PLT 387 313 281   BMET  Recent  Labs  11/06/13 1720 11/07/13 0322  NA 140 140  K 3.7 3.7  CL 98 104  CO2 27 24  GLUCOSE 78 112*  BUN 65* 57*  CREATININE 1.49* 1.30*  CALCIUM 9.8 8.5   LFT  Recent Labs  11/07/13 0322  PROT 5.2*  ALBUMIN 2.6*  AST 14  ALT 8  ALKPHOS 51  BILITOT 0.3    PREVIOUS ENDOSCOPIES:            see HPI   Impression / Plan:   16. 80 year old female with acute upper GI bleed manifested by melena. For further evaluation patient will be scheduled for EGD. The benefits, risks, and potential complications of EGD with possible biopsies were discussed with the patient and she agrees to proceed. Patient is NPO. She is on PPI drip.   2. Anemia of acute blood loss. Baseline hgb around 12, on admission it was 10.9 and today it is down to 8.9. She has been typed/screened/crossmatched for blood.  3. Chronic constipation. 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 Diverticulosis  5. Multiple medical problems including, but not limited to CAD with remote stent placement, aortic stenosis, DM, atrial fibrillation, obesity and asthma  Thanks   LOS: 1 day   Tye Savoy  11/07/2013, 8:22 AM  Attending MD note:   I have taken a history, examined the patient, and reviewed the chart. I agree with the Advanced Practitioner's impression and recommendations. Tenia N. melanic stools are consistent with upper GI bleed. Please see upper endoscopy note. Hemodynamically stable. Based on findings on upper endoscopy she may be able to be discharged tomorrow.  [facility] Pager # [phone]

## 2013-11-07 NOTE — Op Note (Signed)
[facility] [place], [address]   ENDOSCOPY PROCEDURE REPORT  PATIENT: Cassidy, [name]  MR#: [id number] BIRTHDATE: 1926-12-30 , 14  yrs. old GENDER: Female ENDOSCOPIST: Lafayette Dragon, MD REFERRED BY:  Inda Castle, M.D. PROCEDURE DATE:  11/07/2013 PROCEDURE:  EGD w/ biopsy ASA CLASS:     Class III INDICATIONS:  ,,melena hemoglobin 10.9, BUN 65, suspect upper GI bleed. MEDICATIONS: These medications were titrated to patient response per physician's verbal order, Fentanyl 50 mcg IV, and Versed 3 mg IV TOPICAL ANESTHETIC: Cetacaine Spray  DESCRIPTION OF PROCEDURE: After the risks benefits and alternatives of the procedure were thoroughly explained, informed consent was obtained.  The Pentax Gastroscope [id number] endoscope was introduced through the mouth and advanced to the second portion of the duodenum. Without limitations.  The instrument was slowly withdrawn as the mucosa was fully examined.      Esophagus: Esophageal mucosa was normal in upper mid and distal esophagus there was no esophageal stricture or esophagitis M: There were multiple small gastric ulcers and erosions within the gastric antrum and at the incisura. There were no stigmata of active bleeding, erosions measured 2-3 mm. There were 2 superficial ulcers measuring 5 mm which appear benign. multiple biopsies were taken from the gastric antrum to rule out H. pylori Gastric outlet was normal. Retroflexion of the endoscope revealed normal fundus and cardia Duodenum: There were multiple erosions in duodenal bulb and descending duodenum but there was no active bleeding. There was no blood in the stomach or in the duodenum[         The scope was then withdrawn from the patient and the procedure completed.  COMPLICATIONS: There were no complications. ENDOSCOPIC IMPRESSION:  erosive gastritis 2 small gastric ulcers, nonbleeding. Status post biopsies to rule out H.  pylori moderately severe duodenitis, no evidence of active bleeding  RECOMMENDATIONS: 1.  Await pathology results 2.  Advance diet Continue PPI orally Hold NSAID's,alcohol, ASA Follow H&H Hold off on colonoscopy for now as the bleeding most likely related to upper GI source May be discharged in next 24 hours treat accordingly if CLOtest positive Iron supplements  REPEAT EXAM: for EGD pending biopsy results. , Follow up with Dr Deatra Ina  eSigned:  Lafayette Dragon, MD 11/07/2013 9:47 AM   CC:  PATIENT NAME:  Cassidy, [name] MR#: [id number]

## 2013-11-08 ENCOUNTER — Encounter: Payer: Self-pay | Admitting: Internal Medicine

## 2013-11-08 ENCOUNTER — Encounter (HOSPITAL_COMMUNITY): Payer: Self-pay | Admitting: Internal Medicine

## 2013-11-08 DIAGNOSIS — K269 Duodenal ulcer, unspecified as acute or chronic, without hemorrhage or perforation: Secondary | ICD-10-CM

## 2013-11-08 DIAGNOSIS — I2510 Atherosclerotic heart disease of native coronary artery without angina pectoris: Secondary | ICD-10-CM

## 2013-11-08 DIAGNOSIS — E119 Type 2 diabetes mellitus without complications: Secondary | ICD-10-CM

## 2013-11-08 DIAGNOSIS — I4891 Unspecified atrial fibrillation: Secondary | ICD-10-CM

## 2013-11-08 DIAGNOSIS — K922 Gastrointestinal hemorrhage, unspecified: Secondary | ICD-10-CM

## 2013-11-08 LAB — BASIC METABOLIC PANEL WITH GFR
BUN: 29 mg/dL — ABNORMAL HIGH (ref 6–23)
CO2: 26 meq/L (ref 19–32)
Calcium: 8.8 mg/dL (ref 8.4–10.5)
Chloride: 104 meq/L (ref 96–112)
Creatinine, Ser: 1.1 mg/dL (ref 0.50–1.10)
GFR calc Af Amer: 52 mL/min — ABNORMAL LOW
GFR calc non Af Amer: 45 mL/min — ABNORMAL LOW
Glucose, Bld: 181 mg/dL — ABNORMAL HIGH (ref 70–99)
Potassium: 4.4 meq/L (ref 3.7–5.3)
Sodium: 139 meq/L (ref 137–147)

## 2013-11-08 LAB — GLUCOSE, CAPILLARY
GLUCOSE-CAPILLARY: 249 mg/dL — AB (ref 70–99)
Glucose-Capillary: 180 mg/dL — ABNORMAL HIGH (ref 70–99)
Glucose-Capillary: 170 mg/dL — ABNORMAL HIGH (ref 70–99)

## 2013-11-08 LAB — HEMOGLOBIN AND HEMATOCRIT, BLOOD
HCT: 29 % — ABNORMAL LOW (ref 36.0–46.0)
HEMATOCRIT: 27 % — AB (ref 36.0–46.0)
HEMOGLOBIN: 9 g/dL — AB (ref 12.0–15.0)
Hemoglobin: 9.5 g/dL — ABNORMAL LOW (ref 12.0–15.0)

## 2013-11-08 MED ORDER — POLYETHYLENE GLYCOL 3350 17 G PO PACK
17.0000 g | PACK | Freq: Every day | ORAL | Status: DC
  Filled 2013-11-08: qty 1

## 2013-11-08 MED ORDER — PANTOPRAZOLE SODIUM 40 MG PO TBEC: 40.0000 mg | DELAYED_RELEASE_TABLET | Freq: Two times a day (BID) | ORAL | Status: DC

## 2013-11-08 MED ORDER — METHYLCELLULOSE (LAXATIVE) PO PACK: 1.0000 | PACK | ORAL | Status: DC | PRN

## 2013-11-08 MED ORDER — GI COCKTAIL ~~LOC~~
30.0000 mL | Freq: Once | ORAL | Status: AC
  Administered 2013-11-08: 30 mL via ORAL
  Filled 2013-11-08: qty 30

## 2013-11-08 MED ORDER — PANTOPRAZOLE SODIUM 40 MG PO TBEC
40.0000 mg | DELAYED_RELEASE_TABLET | Freq: Two times a day (BID) | ORAL | Status: DC
  Administered 2013-11-08: 40 mg via ORAL
  Filled 2013-11-08: qty 1

## 2013-11-08 MED ORDER — POLYETHYLENE GLYCOL 3350 17 G PO PACK: 17.0000 g | PACK | Freq: Every day | ORAL | Status: DC

## 2013-11-08 NOTE — Discharge Summary (Signed)
Physician Discharge Summary  Cassidy [name]:[id number] DOB: 1927-07-18 DOA: 11/06/2013  PCP: [name], MD  Admit date: 11/06/2013 Discharge date: 11/08/2013  Time spent: 30 minutes  Recommendations for Outpatient Follow-up:  1. Follow up with PCP in one week  Discharge Diagnoses:  Active Problems:   CAD (coronary artery disease)   Atrial fibrillation   HTN (hypertension)   DM (diabetes mellitus)   Upper GI bleed   Duodenal ulcer   Gastric ulcer with hemorrhage   Discharge Condition: improved.   Diet recommendation: diabetic diet  Filed Weights   11/06/13 1623 11/06/13 2208 11/07/13 2144  Weight: 81.647 kg (180 lb) 82.9 kg (182 lb 12.2 oz) 84.641 kg (186 lb 9.6 oz)    History of present illness:  Cassidy [name] is a 80 y.o. female  has a past medical history of DM (diabetes mellitus); CAD (coronary artery disease); Atrial fibrillation; HTN (hypertension); Diverticulosis; Colon polyp; Ventral hernia; Aortic stenosis; Ascending aorta dilatation;Presented with black stools.  Patient has hx of constipation and was seen for that with Dr. Deatra Ina. Last week she had a Barrium enema done at his office. This AM when she went to have a BM she noted it to be black. States she had remote hx of PUD 30-40 years ago. She had some epigastric and lower abdominal pain initially that now has resolved. GI has been notified and will see her in AM. Denies any chest pain or shortness of breath. [facility] called for admission.      Hospital Course:  Upper GI bleed - Hx of PUD in remote past, symptomatic anemia, soft blood pressure was admitted to stepdown. Start on protonix gtt. GI consulted and underwent EGD, showed erosive gastritis with gastric ulcers and severe duodenitis. Her H&h is 8.9, will transfuse if hemoglobin drops to less than 8. Her H&H is stable. She was started on regular diet and she was discharged in am.  . Atrial fibrillation - rate controlled, hold aspirin . CAD  (coronary artery disease) - currently asymptomatic  . HTN (hypertension) - controlled.  . DM (diabetes mellitus) - resume insulin.      Procedures: EGD  Consultations: gi  Discharge Exam: Filed Vitals:   11/08/13 0445  BP: 123/99  Pulse: 85  Temp: 98.4 F (36.9 C)  Resp: 16   General: Alert afebrile comfortable  Cardiovascular: s1s2  Respiratory: ctab  Abdomen: soft NT ND BS+  Musculoskeletal: nopedal edema.     Discharge Instructions  Discharge Orders   Future Appointments Provider Department Dept Phone   11/17/2013 10:00 AM Willia Craze, NP [facility] [phone]   06/12/2014 12:15 PM Hayden Pedro, MD [facility] [phone]   Future Orders Complete By Expires   Diet - low sodium heart healthy  As directed    Discharge instructions  As directed    Comments:     Follow up with GI as recommended.       Medication List    STOP taking these medications       aspirin 81 MG tablet      TAKE these medications       ALPRAZolam 0.25 MG tablet  Commonly known as:  XANAX  Take 0.25 mg by mouth at bedtime.     amitriptyline 10 MG tablet  Commonly known as:  ELAVIL  Take 10 mg by mouth at bedtime.     donepezil 10 MG tablet  Commonly known as:  ARICEPT  Take 10 mg  by mouth at bedtime.     HYDROcodone-acetaminophen 5-325 MG per tablet  Commonly known as:  NORCO/VICODIN  Take 1 tablet by mouth at bedtime.     insulin NPH-regular Human (70-30) 100 UNIT/ML injection  Commonly known as:  NOVOLIN 70/30  Inject 15-30 Units into the skin 2 (two) times daily with a meal. 30 units in the morning and 15 units at night     lisinopril-hydrochlorothiazide 20-25 MG per tablet  Commonly known as:  PRINZIDE,ZESTORETIC  Take 1 tablet by mouth every morning.     methylcellulose packet  Commonly known as:  CITRUCEL FIBER LAXATIVE  Take 1 each by mouth as needed for constipation.     pantoprazole 40 MG tablet   Commonly known as:  PROTONIX  Take 1 tablet (40 mg total) by mouth 2 (two) times daily.     polyethylene glycol packet  Commonly known as:  MIRALAX / GLYCOLAX  Take 17 g by mouth daily.       Allergies  Allergen Reactions  . Latex Other (See Comments)    Unknown       Follow-up Information   Follow up with BADGER,MICHAEL C, MD. Schedule an appointment as soon as possible for a visit in 1 week.   Specialty:  Family Medicine   Contact information:   [address] [place] [address] [phone]       Follow up with Lina Sarora Brodie, MD. Schedule an appointment as soon as possible for a visit on 11/17/2013.   Specialty:  Gastroenterology   Contact information:   [address] [place] [address] [phone]        The results of significant diagnostics from this hospitalization (including imaging, microbiology, ancillary and laboratory) are listed below for reference.    Significant Diagnostic Studies: Dg Colon W/cm Ltd  10/11/2013   CLINICAL DATA:  Lower abdominal pain, constipation, family history of colon cancer.  EXAM: SINGLE COLUMN BARIUM ENEMA  TECHNIQUE: Initial scout AP supine abdominal image obtained to insure adequate colon cleansing. Barium was introduced into the colon in a retrograde fashion and refluxed from the rectum to the cecum. Spot images of the colon followed by overhead radiographs were obtained.  COMPARISON:  None.  FLUOROSCOPY TIME:  2 min 27 seconds  FINDINGS: Evaluation of the scout images demonstrate some paucity of bowel gas within nondilated loops of large small bowel. Patient is status post fusion of L4-5. Surgical wiring projects within the left inguinal region. Atherosclerotic calcifications identified within the aorta.  Contrast appreciated filling the rectosigmoid colon. There is diffuse diverticulosis within the sigmoid colon extending into the proximal descending colon. There does not appear to be evidence of diverticulitis. There is  no evidence of filling defects appreciated within the descending, transverse, and ascending colon regions. Evaluation of the cecum is unremarkable. The hepatic and splenic flexure regions are unremarkable. 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  IMPRESSION: Diffuse diverticulosis within the sigmoid colon and distal descending colon. Otherwise within the limitations of a single column barium enema no further focal acute abnormalities identified. 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   Electronically Signed   By: Salome HolmesHector  Cooper M.D.   On: 10/11/2013 15:32    Microbiology: Recent Results (from the past 240 hour(s))  MRSA PCR SCREENING     Status: None   Collection Time    11/06/13 10:15 PM      Result Value Ref Range Status   MRSA by PCR NEGATIVE  NEGATIVE Final  Comment:            The GeneXpert MRSA Assay (FDA     approved for NASAL specimens     only), is one component of a     comprehensive MRSA colonization     surveillance program. It is not     intended to diagnose MRSA     infection nor to guide or     monitor treatment for     MRSA infections.     Labs: Basic Metabolic Panel:  Recent Labs Lab 11/06/13 1720 11/07/13 0322  NA 140 140  K 3.7 3.7  CL 98 104  CO2 27 24  GLUCOSE 78 112*  BUN 65* 57*  CREATININE 1.49* 1.30*  CALCIUM 9.8 8.5  MG  --  1.8  PHOS  --  3.6   Liver Function Tests:  Recent Labs Lab 11/06/13 1720 11/07/13 0322  AST 14 14  ALT 9 8  ALKPHOS 63 51  BILITOT <0.2* 0.3  PROT 6.4 5.2*  ALBUMIN 3.1* 2.6*   No results found for this basename: LIPASE, AMYLASE,  in the last 168 hours No results found for this basename: AMMONIA,  in the last 168 hours CBC:  Recent Labs Lab 11/06/13 1720 11/06/13 2246 11/07/13 0322 11/07/13 1055 11/07/13 2040 11/08/13 0505  WBC 9.6 10.7* 8.7 6.8  --   --   NEUTROABS 6.4  --   --   --   --   --   HGB 10.9* 9.7* 8.9* 9.3* 9.1* 9.0*  HCT 33.2* 30.3* 27.5* 28.5* 27.8* 27.0*  MCV 79.4 80.6  80.4 80.5  --   --   PLT 387 313 281 286  --   --    Cardiac Enzymes: No results found for this basename: CKTOTAL, CKMB, CKMBINDEX, TROPONINI,  in the last 168 hours BNP: BNP (last 3 results) No results found for this basename: PROBNP,  in the last 8760 hours CBG:  Recent Labs Lab 11/07/13 2149 11/07/13 2335 11/08/13 0446 11/08/13 0752 11/08/13 1146  GLUCAP 217* 191* 249* 180* 170*       Signed:  Edell Mesenbrink  [facility] 11/08/2013, 12:04 PM

## 2013-11-08 NOTE — Progress Notes (Signed)
Progress Note   Subjective  feels okay. No bleeding   Objective   Vital signs in last 24 hours: Temp:  [97.7 F (36.5 C)-98.6 F (37 C)] 98.4 F (36.9 C) (03/10 0445) Pulse Rate:  [58-85] 85 (03/10 0445) Resp:  [10-23] 16 (03/10 0445) BP: (94-130)/(41-99) 123/99 mmHg (03/10 0445) SpO2:  [92 %-100 %] 96 % (03/10 0445) Weight:  [186 lb 9.6 oz (84.641 kg)] 186 lb 9.6 oz (84.641 kg) (03/09 2144) Last BM Date: 11/08/13 (RN and Patient reported she had BM after enema. ) General:    white female in NAD Abdomen:  Soft, nontender and nondistended. Normal bowel sounds. Extremities:  Without edema. Neurologic:  Alert and oriented,  grossly normal neurologically. Psych:  Cooperative. Normal mood and affect.  Lab Results:  Recent Labs  11/06/13 2246 11/07/13 0322 11/07/13 1055 11/07/13 2040 11/08/13 0505  WBC 10.7* 8.7 6.8  --   --   HGB 9.7* 8.9* 9.3* 9.1* 9.0*  HCT 30.3* 27.5* 28.5* 27.8* 27.0*  PLT 313 281 286  --   --    BMET  Recent Labs  11/06/13 1720 11/07/13 0322  NA 140 140  K 3.7 3.7  CL 98 104  CO2 27 24  GLUCOSE 78 112*  BUN 65* 57*  CREATININE 1.49* 1.30*  CALCIUM 9.8 8.5   LFT  Recent Labs  11/07/13 0322  PROT 5.2*  ALBUMIN 2.6*  AST 14  ALT 8  ALKPHOS 51  BILITOT 0.3   EGD 09/09/13 Esophagus: Esophageal mucosa was normal in upper mid and distal  esophagus there was no esophageal stricture or esophagitis  M: There were multiple small gastric ulcers and erosions within the  gastric antrum and at the incisura. There were no stigmata of  active bleeding, erosions measured 2-3 mm. There were 2 superficial  ulcers measuring 5 mm which appear benign. multiple biopsies were  taken from the gastric antrum to rule out H. pylori Gastric outlet  was normal. Retroflexion of the endoscope revealed normal fundus  and cardia  Duodenum: There were multiple erosions in duodenal bulb and  descending duodenum but there was no active bleeding. There was no    blood in the stomach or in the duodenum[ The scope was then  withdrawn from the patient and the procedure completed.  COMPLICATIONS: There were no complications.  ENDOSCOPIC IMPRESSION:  erosive gastritis  2 small gastric ulcers, nonbleeding. Status post biopsies to rule  out H. pylori  moderately severe duodenitis, no evidence of active bleeding  RECOMMENDATIONS:  1. Await pathology results  2. Advance diet  Continue PPI orally  Hold NSAID's,alcohol, ASA  Follow H&H  Hold off on colonoscopy for now as the bleeding most likely related  to upper GI source  May be discharged in next 24 hours  treat accordingly if CLOtest positive  Iron supplements  REPEAT EXAM: for EGD pending biopsy results. , Follow up with Dr  Deatra Ina     Assessment / Plan:   1. Upper GI bleed likely secondary to gastric ulcers, duodenitis seen on EGD. No further bleeding. Biopsies pending. Will change Protonix to PO BID. Follow up with [place] 11/17/13 at 10am.    2. Anemia of acute blood loss. Baseline hgb around 12, on admission it was 10.9. Nadir hgb this admission 8.9. Without transfusion his hgb has stabliized at 9.0.Will need iron supplements upon discharge.    3. Chronic constipation. 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 Citrucel, Miralax and prn dulcolax  suppositories another option   4. Diverticulosis   5. Multiple medical problems including, but not limited to CAD with remote stent placement, aortic stenosis, DM, atrial fibrillation, obesity and asthma   LOS: 2 days   Cassidy [name]  11/08/2013, 9:06 AM   Attending MD note:   I have taken a history, examined the patient, and reviewed the chart. I agree with the Advanced Practitioner's impression and recommendations. Hgb stable. Tolerating food. ? Home today? Will sign off  [facility] Pager # [phone]

## 2013-11-10 LAB — TYPE AND SCREEN
ABO/RH(D): O NEG
Antibody Screen: NEGATIVE
Unit division: 0

## 2013-11-17 ENCOUNTER — Encounter: Payer: Self-pay | Admitting: Nurse Practitioner

## 2013-11-17 ENCOUNTER — Ambulatory Visit (INDEPENDENT_AMBULATORY_CARE_PROVIDER_SITE_OTHER): Payer: Medicare Other | Admitting: Nurse Practitioner

## 2013-11-17 ENCOUNTER — Telehealth: Payer: Self-pay | Admitting: *Deleted

## 2013-11-17 VITALS — BP 124/72 | HR 74 | Ht 59.0 in | Wt 189.0 lb

## 2013-11-17 DIAGNOSIS — K922 Gastrointestinal hemorrhage, unspecified: Secondary | ICD-10-CM

## 2013-11-17 DIAGNOSIS — K254 Chronic or unspecified gastric ulcer with hemorrhage: Secondary | ICD-10-CM

## 2013-11-17 DIAGNOSIS — I2510 Atherosclerotic heart disease of native coronary artery without angina pectoris: Secondary | ICD-10-CM

## 2013-11-17 MED ORDER — PANTOPRAZOLE SODIUM 40 MG PO TBEC: 40.0000 mg | DELAYED_RELEASE_TABLET | Freq: Every day | ORAL | Status: DC

## 2013-11-17 NOTE — Progress Notes (Signed)
     History of Present Illness:  Patient is an 80 year old female known remotely to Dr. Deatra Ina for a family history of colon cancer in a parent and a personal history of diverticulosis.  Patient was hospitalized earlier this month for an upper GI bleed. EGD revealed erosive gastritis, moderately to severe duodenitis and 2 small gastric ulcers. Biopsies negative for H. pylori. Patient had been taking a baby aspirin, no other NSAIDs. She was not on a PPI at home.  Patient feels well. No abdominal pain. No nausea. She's had no black stools.  Current Medications, Allergies, Past Medical History, Past Surgical History, Family History and Social History were reviewed in [employer] record.   Physical Exam: General: Pleasant, well developed , white female in no acute distress Head: Normocephalic and atraumatic Eyes:  sclerae anicteric, conjunctiva pink  Ears: Normal auditory acuity Lungs: Clear throughout to auscultation Heart: Regular rate and rhythm Abdomen: Limited exam, patient cannot get on the table. Abdomen is soft, obese, nontender. Normal bowel sounds Neurological: Alert oriented x 4, grossly nonfocal Psychological:  Alert and cooperative. Normal mood and affect   Stomach, biopsy, gastric antrum - GASTRIC BODY AND ANTRAL TYPE MUCOSA WITH ASSOCIATED MINIMAL CHRONIC INFLAMMATION AND REACTIVE CHANGES. - NO EVIDENCE OF HELICOBACTER PYLORI, INTESTINAL METAPLASIA, DYSPLASIA, OR MALIGNANCY.   Assessment and Recommendations:   55. 80 year old female with multiple medical problems, recently admitted for upper GI bleed secondary to peptic ulcer disease/erosive disease. No H. pylori on biopsies. Patient had been taking a baby aspirin at home. She was discharged from the hospital on twice daily PPI therapy. I have advised her to decrease that to once daily and continue for 30 days. Following that she can discontinue the PPI unless there are plans to restart the baby  aspirin or any other NSAIDs.    2. Anemia of acute blood loss. Hemoglobin at time of recent admission was 10.9.  No blood transfusion required. Hgb 9.5 at time of discharge on 11/08/13.

## 2013-11-17 NOTE — Patient Instructions (Signed)
Please decrease Pantoprazole to one tablet by mouth once daily. Can take for one month then stop. If you start your baby aspirin or any type of NSAID's / Inflammatory medications please start back on Pantoprazole

## 2013-11-17 NOTE — Telephone Encounter (Signed)
Patient called back and said that she forgot to tell you this morning that she has a yeast infection and needs something for it. Patient also stated that she was told that she needed to take iron. Patient wants to know does she do prescription or OTC?

## 2013-11-18 NOTE — Telephone Encounter (Signed)
LMOM for patient to call back.

## 2013-11-18 NOTE — Telephone Encounter (Signed)
Cassidy [name], Iron may be help to recover some of the losses she had with recent bleed. Her hgb is not critically low, she could do without it. If she feels tired or not at usual energy level please give her ferrous sulfate 325mg  daily but cautioned constipation. If she chooses to take the iron then we should recheck CBC in a month. thanks

## 2013-11-18 NOTE — Progress Notes (Signed)
Reviewed and agree with management. Robert D. Kaplan, M.D., FACG  

## 2013-11-21 ENCOUNTER — Ambulatory Visit: Payer: Medicare Other | Admitting: Cardiology

## 2014-06-06 ENCOUNTER — Encounter: Payer: Self-pay | Admitting: Gastroenterology

## 2014-06-12 ENCOUNTER — Ambulatory Visit (INDEPENDENT_AMBULATORY_CARE_PROVIDER_SITE_OTHER): Payer: Medicare Other | Admitting: Ophthalmology

## 2014-06-12 DIAGNOSIS — H35033 Hypertensive retinopathy, bilateral: Secondary | ICD-10-CM

## 2014-06-12 DIAGNOSIS — H43813 Vitreous degeneration, bilateral: Secondary | ICD-10-CM

## 2014-06-12 DIAGNOSIS — H34812 Central retinal vein occlusion, left eye: Secondary | ICD-10-CM

## 2014-06-12 DIAGNOSIS — I10 Essential (primary) hypertension: Secondary | ICD-10-CM

## 2014-06-12 DIAGNOSIS — E11319 Type 2 diabetes mellitus with unspecified diabetic retinopathy without macular edema: Secondary | ICD-10-CM

## 2014-06-12 DIAGNOSIS — E11329 Type 2 diabetes mellitus with mild nonproliferative diabetic retinopathy without macular edema: Secondary | ICD-10-CM

## 2015-01-24 ENCOUNTER — Other Ambulatory Visit: Payer: Self-pay | Admitting: Family Medicine

## 2015-01-24 DIAGNOSIS — M1990 Unspecified osteoarthritis, unspecified site: Secondary | ICD-10-CM

## 2015-01-24 DIAGNOSIS — Z794 Long term (current) use of insulin: Secondary | ICD-10-CM

## 2015-02-21 ENCOUNTER — Other Ambulatory Visit: Payer: Self-pay | Admitting: Family Medicine

## 2015-02-21 DIAGNOSIS — Z794 Long term (current) use of insulin: Secondary | ICD-10-CM

## 2015-02-21 DIAGNOSIS — M1990 Unspecified osteoarthritis, unspecified site: Secondary | ICD-10-CM

## 2015-02-21 DIAGNOSIS — M810 Age-related osteoporosis without current pathological fracture: Secondary | ICD-10-CM

## 2015-07-06 ENCOUNTER — Ambulatory Visit (INDEPENDENT_AMBULATORY_CARE_PROVIDER_SITE_OTHER): Payer: Medicare Other | Admitting: Ophthalmology

## 2015-07-13 ENCOUNTER — Ambulatory Visit (INDEPENDENT_AMBULATORY_CARE_PROVIDER_SITE_OTHER): Payer: Medicare Other | Admitting: Ophthalmology

## 2015-07-13 DIAGNOSIS — E11311 Type 2 diabetes mellitus with unspecified diabetic retinopathy with macular edema: Secondary | ICD-10-CM

## 2015-07-13 DIAGNOSIS — H35033 Hypertensive retinopathy, bilateral: Secondary | ICD-10-CM

## 2015-07-13 DIAGNOSIS — I10 Essential (primary) hypertension: Secondary | ICD-10-CM

## 2015-07-13 DIAGNOSIS — H34812 Central retinal vein occlusion, left eye, with macular edema: Secondary | ICD-10-CM

## 2015-07-13 DIAGNOSIS — H43813 Vitreous degeneration, bilateral: Secondary | ICD-10-CM | POA: Diagnosis not present

## 2015-07-13 DIAGNOSIS — E113291 Type 2 diabetes mellitus with mild nonproliferative diabetic retinopathy without macular edema, right eye: Secondary | ICD-10-CM | POA: Diagnosis not present

## 2015-12-06 ENCOUNTER — Encounter (HOSPITAL_COMMUNITY): Payer: Self-pay | Admitting: *Deleted

## 2015-12-06 ENCOUNTER — Emergency Department (HOSPITAL_COMMUNITY): Payer: Medicare Other

## 2015-12-06 ENCOUNTER — Emergency Department (HOSPITAL_COMMUNITY)
Admission: EM | Admit: 2015-12-06 | Discharge: 2015-12-06 | Disposition: A | Discharge status: Home / Self Care | Payer: Medicare Other | Attending: Emergency Medicine | Admitting: Emergency Medicine

## 2015-12-06 DIAGNOSIS — Z8711 Personal history of peptic ulcer disease: Secondary | ICD-10-CM | POA: Insufficient documentation

## 2015-12-06 DIAGNOSIS — J45909 Unspecified asthma, uncomplicated: Secondary | ICD-10-CM | POA: Insufficient documentation

## 2015-12-06 DIAGNOSIS — W1839XA Other fall on same level, initial encounter: Secondary | ICD-10-CM | POA: Diagnosis not present

## 2015-12-06 DIAGNOSIS — E785 Hyperlipidemia, unspecified: Secondary | ICD-10-CM | POA: Insufficient documentation

## 2015-12-06 DIAGNOSIS — Y9389 Activity, other specified: Secondary | ICD-10-CM | POA: Diagnosis not present

## 2015-12-06 DIAGNOSIS — Y92009 Unspecified place in unspecified non-institutional (private) residence as the place of occurrence of the external cause: Secondary | ICD-10-CM | POA: Diagnosis not present

## 2015-12-06 DIAGNOSIS — S20211A Contusion of right front wall of thorax, initial encounter: Secondary | ICD-10-CM

## 2015-12-06 DIAGNOSIS — Z794 Long term (current) use of insulin: Secondary | ICD-10-CM | POA: Diagnosis not present

## 2015-12-06 DIAGNOSIS — W19XXXA Unspecified fall, initial encounter: Secondary | ICD-10-CM

## 2015-12-06 DIAGNOSIS — Y998 Other external cause status: Secondary | ICD-10-CM | POA: Diagnosis not present

## 2015-12-06 DIAGNOSIS — E669 Obesity, unspecified: Secondary | ICD-10-CM | POA: Diagnosis not present

## 2015-12-06 DIAGNOSIS — S29001A Unspecified injury of muscle and tendon of front wall of thorax, initial encounter: Secondary | ICD-10-CM | POA: Diagnosis present

## 2015-12-06 DIAGNOSIS — I4891 Unspecified atrial fibrillation: Secondary | ICD-10-CM | POA: Insufficient documentation

## 2015-12-06 DIAGNOSIS — Z8719 Personal history of other diseases of the digestive system: Secondary | ICD-10-CM | POA: Insufficient documentation

## 2015-12-06 DIAGNOSIS — H5442 Blindness, left eye, normal vision right eye: Secondary | ICD-10-CM | POA: Diagnosis not present

## 2015-12-06 DIAGNOSIS — Z7984 Long term (current) use of oral hypoglycemic drugs: Secondary | ICD-10-CM | POA: Diagnosis not present

## 2015-12-06 DIAGNOSIS — Z79899 Other long term (current) drug therapy: Secondary | ICD-10-CM | POA: Insufficient documentation

## 2015-12-06 DIAGNOSIS — E119 Type 2 diabetes mellitus without complications: Secondary | ICD-10-CM | POA: Diagnosis not present

## 2015-12-06 DIAGNOSIS — I10 Essential (primary) hypertension: Secondary | ICD-10-CM | POA: Diagnosis not present

## 2015-12-06 DIAGNOSIS — S21111A Laceration without foreign body of right front wall of thorax without penetration into thoracic cavity, initial encounter: Secondary | ICD-10-CM | POA: Diagnosis not present

## 2015-12-06 DIAGNOSIS — I2510 Atherosclerotic heart disease of native coronary artery without angina pectoris: Secondary | ICD-10-CM | POA: Insufficient documentation

## 2015-12-06 DIAGNOSIS — Z87442 Personal history of urinary calculi: Secondary | ICD-10-CM | POA: Insufficient documentation

## 2015-12-06 DIAGNOSIS — Z86010 Personal history of colonic polyps: Secondary | ICD-10-CM | POA: Diagnosis not present

## 2015-12-06 DIAGNOSIS — Z91040 Latex allergy status: Secondary | ICD-10-CM | POA: Diagnosis not present

## 2015-12-06 MED ORDER — HYDROCODONE-ACETAMINOPHEN 5-325 MG PO TABS: 1.0000 | ORAL_TABLET | ORAL | Status: DC | PRN

## 2015-12-06 MED ORDER — HYDROCODONE-ACETAMINOPHEN 5-325 MG PO TABS
1.0000 | ORAL_TABLET | Freq: Once | ORAL | Status: AC
  Administered 2015-12-06: 1 via ORAL
  Filled 2015-12-06: qty 1

## 2015-12-06 MED ORDER — IBUPROFEN 200 MG PO TABS
600.0000 mg | ORAL_TABLET | Freq: Once | ORAL | Status: AC
  Administered 2015-12-06: 600 mg via ORAL
  Filled 2015-12-06: qty 3

## 2015-12-06 NOTE — Discharge Instructions (Signed)
Rib Contusion A rib contusion is a deep bruise on your rib area. Contusions are the result of a blunt trauma that causes bleeding and injury to the tissues under the skin. A rib contusion may involve bruising of the ribs and of the skin and muscles in the area. The skin overlying the contusion may turn blue, purple, or yellow. Minor injuries will give you a painless contusion, but more severe contusions may stay painful and swollen for a few weeks. CAUSES  A contusion is usually caused by a blow, trauma, or direct force to an area of the body. This often occurs while playing contact sports. SYMPTOMS  Swelling and redness of the injured area.  Discoloration of the injured area.  Tenderness and soreness of the injured area.  Pain with or without movement. DIAGNOSIS  The diagnosis can be made by taking a medical history and performing a physical exam. An X-ray, CT scan, or MRI may be needed to determine if there were any associated injuries, such as broken bones (fractures) or internal injuries. TREATMENT  Often, the best treatment for a rib contusion is rest. Icing or applying cold compresses to the injured area may help reduce swelling and inflammation. Deep breathing exercises may be recommended to reduce the risk of partial lung collapse and pneumonia. Over-the-counter or prescription medicines may also be recommended for pain control. HOME CARE INSTRUCTIONS   Apply ice to the injured area:  Put ice in a plastic bag.  Place a towel between your skin and the bag.  Leave the ice on for 20 minutes, 2-3 times per day.  Take medicines only as directed by your health care provider.  Rest the injured area. Avoid strenuous activity and any activities or movements that cause pain. Be careful during activities and avoid bumping the injured area.  Perform deep-breathing exercises as directed by your health care provider.  Do not lift anything that is heavier than 5 lb (2.3 kg) until your  health care provider approves.  Do not use any tobacco products, including cigarettes, chewing tobacco, or electronic cigarettes. If you need help quitting, ask your health care provider. SEEK MEDICAL CARE IF:   You have increased bruising or swelling.  You have pain that is not controlled with treatment.  You have a fever. SEEK IMMEDIATE MEDICAL CARE IF:   You have difficulty breathing or shortness of breath.  You develop a continual cough, or you cough up thick or bloody sputum.  You feel sick to your stomach (nauseous), you throw up (vomit), or you have abdominal pain.   This information is not intended to replace advice given to you by your health care provider. Make sure you discuss any questions you have with your health care provider.   Document Released: 05/13/2001 Document Revised: 09/08/2014 Document Reviewed: 05/30/2014 Elsevier Interactive Patient Education 2016 [employer]  

## 2015-12-06 NOTE — ED Provider Notes (Signed)
CSN: [id number]     Arrival date & time 12/06/15  1018 History   First MD Initiated Contact with Patient 12/06/15 1024     Chief Complaint  Patient presents with  . Fall     HPI Patient presents to the emergency department with complaints of right lateral and right posterior rib pain after a fall today.  She was putting up close when she stepped backwards and fell backwards injuring her right side.  She presents with a small skin tear to her right posterior chest wall.  She states it is painful to palpation.  Her pain is moderate in severity.  Her pain is improved by lying still.  She denies head injury.  No neck pain.  She denies weakness of her arms or legs.  She denies pain in her abdomen.  She denies hip pain, knee pain and has been able to ambulate since the fall.   Past Medical History  Diagnosis Date  . DM (diabetes mellitus) ([place])   . CAD (coronary artery disease)     stents right coronary artery 2001  /  nuclear, March, 2009, no ischemia  . Atrial fibrillation (HCC)     Coumadin is not used per the patient because of her eyes  . HTN (hypertension)   . Diverticulosis   . Colon polyp   . Ventral hernia   . Aortic stenosis     mild, echo, February, 2009  . Ascending aorta dilatation (HCC)     mild, echo, February, 2009  . Asthma   . Obesity   . Blindness of left eye     related to some type of stroke in the past  . Dyslipidemia   . Ejection fraction     EF 70%, nuclear, 2009, normal, echo, 2009  . Falling episodes   . Kidney stones   . PUD (peptic ulcer disease)    Past Surgical History  Procedure Laterality Date  . Total abdominal hysterectomy    . Appendectomy    . Tonsillectomy    . Esophagogastroduodenoscopy N/A 11/07/2013    Procedure: ESOPHAGOGASTRODUODENOSCOPY (EGD);  Surgeon: Lafayette Dragon, MD;  Location: [facility];  Service: Endoscopy;  Laterality: N/A;   Family History  Problem Relation Age of Onset  . Colon cancer Mother   . Diabetes Daughter     x 2   . Diabetes Sister    Social History  Substance Use Topics  . Smoking status: Never Smoker   . Smokeless tobacco: Never Used  . Alcohol Use: No   OB History    No data available     Review of Systems  All other systems reviewed and are negative.     Allergies  Latex  Home Medications   Prior to Admission medications   Medication Sig Start Date End Date Taking? Authorizing Provider  acidophilus (RISAQUAD) CAPS capsule Take 1 capsule by mouth daily.   Yes Historical Provider, MD  ALPRAZolam Duanne Moron) 1 MG tablet Take 1 mg by mouth at bedtime as needed for anxiety.   Yes Historical Provider, MD  amitriptyline (ELAVIL) 10 MG tablet Take 10 mg by mouth at bedtime.   Yes Historical Provider, MD  Calcium Citrate-Vitamin D (CALCIUM + D PO) Take 1 tablet by mouth daily.   Yes Historical Provider, MD  donepezil (ARICEPT) 10 MG tablet Take 10 mg by mouth at bedtime.    Yes Historical Provider, MD  insulin NPH-insulin regular (NOVOLIN 70/30) (70-30) 100 UNIT/ML injection Inject 15-30 Units into the  skin 2 (two) times daily with a meal. 30 units in the morning and 15 units at night   Yes Historical Provider, MD  lisinopril-hydrochlorothiazide (PRINZIDE,ZESTORETIC) 20-25 MG per tablet Take 1 tablet by mouth every morning.    Yes Historical Provider, MD  metFORMIN (GLUCOPHAGE) 500 MG tablet Take 500 mg by mouth 2 (two) times daily with a meal.   Yes Historical Provider, MD  methylcellulose ([place]) packet Take 1 each by mouth as needed for constipation. Patient not taking: Reported on 12/06/2015 11/08/13   Hosie Poisson, MD  pantoprazole (PROTONIX) 40 MG tablet Take 1 tablet (40 mg total) by mouth daily. Patient not taking: Reported on 12/06/2015 11/17/13   Willia Craze, NP  polyethylene glycol [facility] / Floria Raveling) packet Take 17 g by mouth daily. Patient not taking: Reported on 12/06/2015 11/08/13   Hosie Poisson, MD   BP 154/65 mmHg  Pulse 68  Temp(Src) 98 F (36.7 C) (Oral)   Resp 18  SpO2 94% Physical Exam  Constitutional: She is oriented to person, place, and time. She appears well-developed and well-nourished.  HENT:  Head: Normocephalic.  Eyes: EOM are normal.  Neck: Normal range of motion.  C-spine nontender.  Cardiovascular: Normal rate.   Pulmonary/Chest: Effort normal and breath sounds normal.  Posterior right chest wall tenderness and small abrasion and small skin tear without active bleeding.  No obvious bruising.  No obvious deformity.  Tender to palpation  Abdominal: She exhibits no distension.  Musculoskeletal: Normal range of motion.  Full range of motion bilateral hips, knees, ankles, wrists, elbows, shoulders  Neurological: She is alert and oriented to person, place, and time.  Psychiatric: She has a normal mood and affect.  Nursing note and vitals reviewed.   ED Course  Procedures (including critical care time) Labs Review Labs Reviewed - No data to display  Imaging Review Dg Ribs Unilateral W/chest Right  12/06/2015  CLINICAL DATA:  Fall. EXAM: RIGHT RIBS AND CHEST - 3+ VIEW COMPARISON:  04/26/2011 FINDINGS: Moderate cardiac enlargement. Aortic atherosclerosis noted. No displaced rib fractures identified. Chronic fracture involving the proximal right humerus. IMPRESSION: 1. No displaced rib fractures identified. 2. Cardiac enlargement and aortic atherosclerosis. Electronically Signed   By: Kerby Moors M.D.   On: 12/06/2015 11:50   I have personally reviewed and evaluated these images and lab results as part of my medical decision-making.   EKG Interpretation None      MDM   Final diagnoses:  None    Mechanical fall.  Likely rib contusion.  Chest x-ray without obvious rib fracture.  Lungs are normal.  Discharge home on Vicodin.  Home with incentive spirometry.  Instructed to return to the ER for new or worsening symptoms.  Pain improved with Vicodin in the ER    Jola Schmidt, MD 12/06/15 1241

## 2015-12-06 NOTE — ED Notes (Signed)
Per EMS report: pt coming from home and presents to the ED after a fall. Pt was trying reaching up to get something and lost her balance and fell backwards on furniture.  EMS noted an abrasion on her back.  Pt denies LOC or hitting her head.  EMS did not note any obvious deformities.  Pt only c/o right flank pain. Pt a/o x 4. Pt's pain is aggravated with movement otherwise pt has no pain.

## 2015-12-06 NOTE — ED Notes (Signed)
Bed: WA21 Expected date:  Expected time:  Means of arrival:  Comments: 39F/fall/R side pain

## 2015-12-06 NOTE — ED Notes (Signed)
PT DISCHARGED. INSTRUCTIONS AND PRESCRIPTION GIVEN. AAOX3. PT IN NO APPARENT DISTRESS. THE OPPORTUNITY TO ASK QUESTIONS WAS PROVIDED. 

## 2015-12-24 ENCOUNTER — Encounter (INDEPENDENT_AMBULATORY_CARE_PROVIDER_SITE_OTHER): Payer: Medicare Other | Admitting: Ophthalmology

## 2015-12-24 DIAGNOSIS — H43813 Vitreous degeneration, bilateral: Secondary | ICD-10-CM | POA: Diagnosis not present

## 2015-12-24 DIAGNOSIS — E11319 Type 2 diabetes mellitus with unspecified diabetic retinopathy without macular edema: Secondary | ICD-10-CM | POA: Diagnosis not present

## 2015-12-24 DIAGNOSIS — H34812 Central retinal vein occlusion, left eye, with macular edema: Secondary | ICD-10-CM

## 2015-12-24 DIAGNOSIS — H35033 Hypertensive retinopathy, bilateral: Secondary | ICD-10-CM

## 2015-12-24 DIAGNOSIS — E113391 Type 2 diabetes mellitus with moderate nonproliferative diabetic retinopathy without macular edema, right eye: Secondary | ICD-10-CM

## 2015-12-24 DIAGNOSIS — H2512 Age-related nuclear cataract, left eye: Secondary | ICD-10-CM

## 2015-12-24 DIAGNOSIS — I10 Essential (primary) hypertension: Secondary | ICD-10-CM | POA: Diagnosis not present

## 2016-01-15 ENCOUNTER — Encounter: Payer: Self-pay | Admitting: Interventional Cardiology

## 2016-01-15 ENCOUNTER — Ambulatory Visit (INDEPENDENT_AMBULATORY_CARE_PROVIDER_SITE_OTHER): Payer: Medicare Other | Admitting: Interventional Cardiology

## 2016-01-15 VITALS — BP 100/60 | HR 68 | Ht 59.0 in | Wt 180.8 lb

## 2016-01-15 DIAGNOSIS — I2510 Atherosclerotic heart disease of native coronary artery without angina pectoris: Secondary | ICD-10-CM

## 2016-01-15 DIAGNOSIS — H5442 Blindness, left eye, normal vision right eye: Secondary | ICD-10-CM

## 2016-01-15 DIAGNOSIS — E785 Hyperlipidemia, unspecified: Secondary | ICD-10-CM | POA: Diagnosis not present

## 2016-01-15 DIAGNOSIS — I350 Nonrheumatic aortic (valve) stenosis: Secondary | ICD-10-CM

## 2016-01-15 NOTE — Patient Instructions (Signed)
**Note De-Identified  Obfuscation** Medication Instructions:  Same-no changes  Labwork: February 07, 2016-please do not eat or drink after midnight the night before labs are drawn.  Testing/Procedures: None  Follow-Up: Your physician wants you to follow-up in: 1 year. You will receive a reminder letter in the mail two months in advance. If you don't receive a letter, please call our office to schedule the follow-up appointment.   If you need a refill on your cardiac medications before your next appointment, please call your pharmacy.

## 2016-01-15 NOTE — Progress Notes (Signed)
Patient ID: Cassidy [name], female   DOB: [birth year], 83 y.o.   MRN: [id number]     Cardiology Office Note   Date:  01/15/2016   ID:  Cassidy [name], DOB [birth year], MRN [id number]  PCP:  [name], MD    No chief complaint on file.  F/u aortic stenosis  Wt Readings from Last 3 Encounters:  01/15/16 180 lb 12.8 oz (82.01 kg)  11/17/13 189 lb (85.73 kg)  11/07/13 186 lb 9.6 oz (84.641 kg)       History of Present Illness: Cassidy [name] is a 82 y.o. female  who has had atrial fibrillation, coronary artery disease and aortic stenosis. She has not been seen for several years. She had a stent placed to her right coronary artery in 2001. She now uses a walker to get around. She denies any angina. She does not do a lot of dedicated exercise.  She has had atrial fibrillation for many years. She declined anticoagulation in the past. She continues to prefer not to use any anticoagulation. She denies any palpitations.  She has had mild aortic stenosis last visualized by echocardiogram in 2014. She denies any chest discomfort, dyspnea on exertion or syncope. She has not had leg swelling.    Past Medical History  Diagnosis Date  . DM (diabetes mellitus) ([place])   . CAD (coronary artery disease)     stents right coronary artery 2001  /  nuclear, March, 2009, no ischemia  . Atrial fibrillation (HCC)     Coumadin is not used per the patient because of her eyes  . HTN (hypertension)   . Diverticulosis   . Colon polyp   . Ventral hernia   . Aortic stenosis     mild, echo, February, 2009  . Ascending aorta dilatation (HCC)     mild, echo, February, 2009  . Asthma   . Obesity   . Blindness of left eye     related to some type of stroke in the past  . Dyslipidemia   . Ejection fraction     EF 70%, nuclear, 2009, normal, echo, 2009  . Falling episodes   . Kidney stones   . PUD (peptic ulcer disease)     Past Surgical History  Procedure Laterality Date  . Total abdominal  hysterectomy    . Appendectomy    . Tonsillectomy    . Esophagogastroduodenoscopy N/A 11/07/2013    Procedure: ESOPHAGOGASTRODUODENOSCOPY (EGD);  Surgeon: Lafayette Dragon, MD;  Location: [facility];  Service: Endoscopy;  Laterality: N/A;     Current Outpatient Prescriptions  Medication Sig Dispense Refill  . acidophilus (RISAQUAD) CAPS capsule Take 1 capsule by mouth daily.    Marland Kitchen ALPRAZolam (XANAX) 1 MG tablet Take 1 mg by mouth at bedtime as needed for anxiety.    Marland Kitchen amitriptyline (ELAVIL) 10 MG tablet Take 10 mg by mouth at bedtime.    . Calcium Citrate-Vitamin D (CALCIUM + D PO) Take 1 tablet by mouth daily.    Marland Kitchen donepezil (ARICEPT) 10 MG tablet Take 10 mg by mouth at bedtime.     Marland Kitchen HYDROcodone-acetaminophen (NORCO/VICODIN) 5-325 MG tablet Take 1 tablet by mouth every 4 (four) hours as needed for moderate pain. 15 tablet 0  . insulin NPH-insulin regular (NOVOLIN 70/30) (70-30) 100 UNIT/ML injection Inject 15-30 Units into the skin 2 (two) times daily with a meal. 30 units in the morning and 15 units at night    . lisinopril-hydrochlorothiazide (PRINZIDE,ZESTORETIC) 20-25 MG per tablet  Take 1 tablet by mouth every morning.     . metFORMIN (GLUCOPHAGE) 500 MG tablet Take 500 mg by mouth 2 (two) times daily with a meal.    . NON FORMULARY Take 1 tablet by mouth daily. PROBIOTIC COLON SUPPORT    . oxybutynin (DITROPAN-XL) 10 MG 24 hr tablet Take 10 mg by mouth at bedtime.     . polyethylene glycol (MIRALAX / GLYCOLAX) packet Take 17 g by mouth daily. 14 each 0   No current facility-administered medications for this visit.    Allergies:   Latex    Social History:  The patient  reports that she has never smoked. She has never used smokeless tobacco. She reports that she does not drink alcohol or use illicit drugs.   Family History:  The patient's family history includes Colon cancer in her mother; Diabetes in her daughter and sister; Heart attack in her father. There is no history of  Hypertension or Stroke.    ROS:  Please see the history of present illness.   Otherwise, review of systems are positive for decreased balance requiring use of a walker.   All other systems are reviewed and negative.    PHYSICAL EXAM: VS:  BP 100/60 mmHg  Pulse 68  Ht 4\' 11"  (1.499 m)  Wt 180 lb 12.8 oz (82.01 kg)  BMI 36.50 kg/m2 , BMI Body mass index is 36.5 kg/(m^2). GEN: Well nourished, well developed, in no acute distress HEENT: normal Neck: no JVD, carotid bruits, or masses Cardiac: RRR; 2/6 systolic murmur, no rubs, or gallops,no edema  Respiratory:  clear to auscultation bilaterally, normal work of breathing GI: soft, nontender, nondistended, + BS MS: no deformity or atrophy Skin: warm and dry, no rash Neuro:  Strength and sensation are intact Psych: euthymic mood, full affect   EKG:   The ekg ordered today demonstrates atrial fibrillation, nonspecific ST segment changes   Recent Labs: No results found for requested labs within last 365 days.   Lipid Panel    Component Value Date/Time   CHOL 215* 04/27/2011 0520   TRIG 104 04/27/2011 0520   HDL 53 04/27/2011 0520   CHOLHDL 4.1 04/27/2011 0520   VLDL 21 04/27/2011 0520   LDLCALC 141* 04/27/2011 0520     Other studies Reviewed: Additional studies/ records that were reviewed today with results demonstrating: 2014 echocardiogram.  Mild aortic stenosis, normal LV function, mild aortic insufficiency   ASSESSMENT AND PLAN:  1. CAD: Continue aggressive secondary prevention. No angina. 2. Atrial fibrillation: Rate controlled. We again discussed anticoagulation. I recommended that based on her elevated chads score, she would benefit from anticoagulation since she has not had bleeding issues. She still declined anticoagulation. 3. Aortic stenosis: By exam, it does not seem severe. She is not having symptoms of severe aortic stenosis either. 4. Check labs including lipids when she is fasting.   Current medicines are  reviewed at length with the patient today.  The patient concerns regarding her medicines were addressed.  The following changes have been made:  No change  Labs/ tests ordered today include:  No orders of the defined types were placed in this encounter.    Recommend 150 minutes/week of aerobic exercise Low fat, low carb, high fiber diet recommended  Disposition:   FU in 1 year   Signed, Larae Grooms, MD  01/15/2016 12:05 PM    [facility] [address], [place], [place]  [address] Phone: [phone]; Fax: [phone]

## 2016-02-07 ENCOUNTER — Other Ambulatory Visit (INDEPENDENT_AMBULATORY_CARE_PROVIDER_SITE_OTHER): Payer: Medicare Other | Admitting: *Deleted

## 2016-02-07 DIAGNOSIS — E785 Hyperlipidemia, unspecified: Secondary | ICD-10-CM | POA: Diagnosis not present

## 2016-02-07 LAB — COMPREHENSIVE METABOLIC PANEL
ALBUMIN: 3.6 g/dL (ref 3.6–5.1)
ALK PHOS: 51 U/L (ref 33–130)
ALT: 6 U/L (ref 6–29)
AST: 13 U/L (ref 10–35)
BILIRUBIN TOTAL: 0.6 mg/dL (ref 0.2–1.2)
BUN: 18 mg/dL (ref 7–25)
CALCIUM: 9.7 mg/dL (ref 8.6–10.4)
CO2: 29 mmol/L (ref 20–31)
Chloride: 100 mmol/L (ref 98–110)
Creat: 1.06 mg/dL — ABNORMAL HIGH (ref 0.60–0.88)
GLUCOSE: 127 mg/dL — AB (ref 65–99)
POTASSIUM: 4.6 mmol/L (ref 3.5–5.3)
Sodium: 140 mmol/L (ref 135–146)
Total Protein: 6.4 g/dL (ref 6.1–8.1)

## 2016-02-07 LAB — LIPID PANEL
CHOL/HDL RATIO: 3.5 ratio (ref <=5.0)
CHOLESTEROL: 215 mg/dL — AB (ref 125–200)
HDL: 62 mg/dL (ref >=46)
LDL Cholesterol: 127 mg/dL (ref <130)
TRIGLYCERIDES: 132 mg/dL (ref <150)
VLDL: 26 mg/dL (ref <30)

## 2016-02-07 NOTE — Addendum Note (Signed)
Addended by: Eulis Foster on: 02/07/2016 09:09 AM   Modules accepted: Orders

## 2016-07-11 ENCOUNTER — Emergency Department (HOSPITAL_COMMUNITY): Payer: Medicare Other

## 2016-07-11 ENCOUNTER — Encounter (HOSPITAL_COMMUNITY): Payer: Self-pay | Admitting: Emergency Medicine

## 2016-07-11 ENCOUNTER — Emergency Department (HOSPITAL_COMMUNITY)
Admission: EM | Admit: 2016-07-11 | Discharge: 2016-07-11 | Disposition: A | Discharge status: Home / Self Care | Payer: Medicare Other | Attending: Emergency Medicine | Admitting: Emergency Medicine

## 2016-07-11 DIAGNOSIS — Y939 Activity, unspecified: Secondary | ICD-10-CM | POA: Diagnosis not present

## 2016-07-11 DIAGNOSIS — Y999 Unspecified external cause status: Secondary | ICD-10-CM | POA: Insufficient documentation

## 2016-07-11 DIAGNOSIS — Y929 Unspecified place or not applicable: Secondary | ICD-10-CM | POA: Diagnosis not present

## 2016-07-11 DIAGNOSIS — Z794 Long term (current) use of insulin: Secondary | ICD-10-CM | POA: Insufficient documentation

## 2016-07-11 DIAGNOSIS — J45909 Unspecified asthma, uncomplicated: Secondary | ICD-10-CM | POA: Diagnosis not present

## 2016-07-11 DIAGNOSIS — S63602A Unspecified sprain of left thumb, initial encounter: Secondary | ICD-10-CM | POA: Insufficient documentation

## 2016-07-11 DIAGNOSIS — S0990XA Unspecified injury of head, initial encounter: Secondary | ICD-10-CM | POA: Diagnosis present

## 2016-07-11 DIAGNOSIS — E119 Type 2 diabetes mellitus without complications: Secondary | ICD-10-CM | POA: Insufficient documentation

## 2016-07-11 DIAGNOSIS — W1809XA Striking against other object with subsequent fall, initial encounter: Secondary | ICD-10-CM | POA: Diagnosis not present

## 2016-07-11 DIAGNOSIS — Z791 Long term (current) use of non-steroidal anti-inflammatories (NSAID): Secondary | ICD-10-CM | POA: Diagnosis not present

## 2016-07-11 DIAGNOSIS — Z79899 Other long term (current) drug therapy: Secondary | ICD-10-CM | POA: Insufficient documentation

## 2016-07-11 DIAGNOSIS — S0083XA Contusion of other part of head, initial encounter: Secondary | ICD-10-CM | POA: Insufficient documentation

## 2016-07-11 DIAGNOSIS — I2510 Atherosclerotic heart disease of native coronary artery without angina pectoris: Secondary | ICD-10-CM | POA: Diagnosis not present

## 2016-07-11 NOTE — ED Notes (Signed)
Ortho tech made aware of thumb spica order.

## 2016-07-11 NOTE — ED Notes (Signed)
Ortho tech at bedside 

## 2016-07-11 NOTE — ED Notes (Signed)
ED Provider at bedside. 

## 2016-07-11 NOTE — ED Notes (Signed)
Patient ambulated to X-ray 

## 2016-07-11 NOTE — ED Provider Notes (Signed)
[place] DEPT Provider Note   CSN: [id number] Arrival date & time: 07/11/16  [id number]     History   Chief Complaint Chief Complaint  Patient presents with  . Fall  . Head Injury    HPI Cassidy [name] is a 82 y.o. female. She fell 2 days ago. She injured her left thumb. She hit her forehead. She is on a low bench. She has some pain in her neck. No upper extremity numbness or weakness. No loss of consciousness. She is not anticoagulated. She's been wearing a splint on her left thumb that her husband fashioned for her at home.  HPI  Past Medical History:  Diagnosis Date  . Aortic stenosis    mild, echo, February, 2009  . Ascending aorta dilatation (HCC)    mild, echo, February, 2009  . Asthma   . Atrial fibrillation (HCC)    Coumadin is not used per the patient because of her eyes  . Blindness of left eye    related to some type of stroke in the past  . CAD (coronary artery disease)    stents right coronary artery 2001  /  nuclear, March, 2009, no ischemia  . Colon polyp   . Diverticulosis   . DM (diabetes mellitus) ([place])   . Dyslipidemia   . Ejection fraction    EF 70%, nuclear, 2009, normal, echo, 2009  . Falling episodes   . HTN (hypertension)   . Kidney stones   . Obesity   . PUD (peptic ulcer disease)   . Ventral hernia     Patient Active Problem List   Diagnosis Date Noted  . Duodenal ulcer 11/07/2013  . Gastric ulcer with hemorrhage 11/07/2013  . Upper GI bleed 11/06/2013  . Unspecified constipation 09/19/2013  . Family history of malignant neoplasm of gastrointestinal tract 09/19/2013  . Preop cardiovascular exam 09/11/2011  . CAD (coronary artery disease)   . Atrial fibrillation ([place])   . HTN (hypertension)   . Diverticulosis   . Colon polyp   . Aortic stenosis   . Ascending aorta dilatation (HCC)   . Asthma   . Obesity   . Dyslipidemia   . Ejection fraction   . DM (diabetes mellitus) ([employer])   . Falling episodes     Past Surgical  History:  Procedure Laterality Date  . APPENDECTOMY    . ESOPHAGOGASTRODUODENOSCOPY N/A 11/07/2013   Procedure: ESOPHAGOGASTRODUODENOSCOPY (EGD);  Surgeon: Lafayette Dragon, MD;  Location: [facility];  Service: Endoscopy;  Laterality: N/A;  . TONSILLECTOMY    . TOTAL ABDOMINAL HYSTERECTOMY      OB History    No data available       Home Medications    Prior to Admission medications   Medication Sig Start Date End Date Taking? Authorizing Provider  acetaminophen (TYLENOL) 650 MG CR tablet Take 650 mg by mouth every 8 (eight) hours as needed for pain.   Yes Historical Provider, MD  ALPRAZolam Duanne Moron) 1 MG tablet Take 1 mg by mouth at bedtime.    Yes Historical Provider, MD  amitriptyline (ELAVIL) 10 MG tablet Take 10 mg by mouth at bedtime.   Yes Historical Provider, MD  calcium carbonate (OS-CAL - DOSED IN MG OF ELEMENTAL CALCIUM) 1250 (500 Ca) MG tablet Take 1 tablet by mouth daily with breakfast.   Yes Historical Provider, MD  donepezil (ARICEPT) 10 MG tablet Take 10 mg by mouth at bedtime.    Yes Historical Provider, MD  insulin NPH-insulin regular (NOVOLIN 70/30) (  70-30) 100 UNIT/ML injection Inject 15-30 Units into the skin 2 (two) times daily with a meal. 30 units in the morning and 15 units at night   Yes Historical Provider, MD  losartan-hydrochlorothiazide (HYZAAR) 100-12.5 MG tablet Take 1 tablet by mouth daily with breakfast. 06/25/16  Yes Historical Provider, MD  metFORMIN (GLUCOPHAGE) 500 MG tablet Take 500 mg by mouth 2 (two) times daily with a meal.   Yes Historical Provider, MD  nystatin cream (MYCOSTATIN) Apply 1 application topically 3 (three) times daily as needed (for yeast infection on vagina).  06/14/16  Yes Historical Provider, MD  oxybutynin (DITROPAN-XL) 10 MG 24 hr tablet Take 10 mg by mouth daily with breakfast.  12/29/15  Yes Historical Provider, MD  Probiotic Product (PROBIOTIC COLON SUPPORT) CAPS Take 1 capsule by mouth daily with breakfast.   Yes Historical  Provider, MD    Family History Family History  Problem Relation Age of Onset  . Colon cancer Mother   . Heart attack Father     28  . Diabetes Sister   . Diabetes Daughter     x 2  . Hypertension Neg Hx   . Stroke Neg Hx     Social History Social History  Substance Use Topics  . Smoking status: Never Smoker  . Smokeless tobacco: Never Used  . Alcohol use No     Allergies   Latex   Review of Systems Review of Systems  Constitutional: Negative for appetite change, chills, diaphoresis, fatigue and fever.  HENT: Negative for mouth sores, sore throat and trouble swallowing.        Forehead contusion  Eyes: Negative for visual disturbance.  Respiratory: Negative for cough, chest tightness, shortness of breath and wheezing.   Cardiovascular: Negative for chest pain.  Gastrointestinal: Negative for abdominal distention, abdominal pain, diarrhea, nausea and vomiting.  Endocrine: Negative for polydipsia, polyphagia and polyuria.  Genitourinary: Negative for dysuria, frequency and hematuria.  Musculoskeletal: Positive for neck pain. Negative for gait problem.  Skin: Negative for color change, pallor and rash.  Neurological: Positive for headaches. Negative for dizziness, syncope and light-headedness.  Hematological: Does not bruise/bleed easily.  Psychiatric/Behavioral: Negative for behavioral problems and confusion.     Physical Exam Updated Vital Signs BP 148/67   Pulse 76   Temp 97.5 F (36.4 C) (Oral)   Resp 18   SpO2 93%   Physical Exam  Constitutional: She is oriented to person, place, and time. She appears well-developed and well-nourished. No distress.  HENT:  Head: Normocephalic.    Eyes: Conjunctivae are normal. Pupils are equal, round, and reactive to light. No scleral icterus.  Neck: Normal range of motion. Neck supple. No thyromegaly present.    Cardiovascular: Normal rate and regular rhythm.  Exam reveals no gallop and no friction rub.   No  murmur heard. Pulmonary/Chest: Effort normal and breath sounds normal. No respiratory distress. She has no wheezes. She has no rales.  Abdominal: Soft. Bowel sounds are normal. She exhibits no distension. There is no tenderness. There is no rebound.  Musculoskeletal: Normal range of motion.       Hands: Neurological: She is alert and oriented to person, place, and time.  Normal symmetric Strength to shoulder shrug, triceps, biceps, grip,wrist flex/extend,and intrinsics  Norma lsymmetric sensation above and below clavicles, and to all distributions to UEs. Norma symmetric strength to flex/.extend hip and knees, dorsi/plantar flex ankles. Normal symmetric sensation to all distributions to LEs Patellar and achilles reflexes 1-2+. Downgoing Babinski  Skin: Skin is warm and dry. No rash noted.  Psychiatric: She has a normal mood and affect. Her behavior is normal.     ED Treatments / Results  Labs (all labs ordered are listed, but only abnormal results are displayed) Labs Reviewed - No data to display  EKG  EKG Interpretation None       Radiology Ct Head Wo Contrast  Result Date: 07/11/2016 CLINICAL DATA:  Fall.  Head injury. EXAM: CT HEAD WITHOUT CONTRAST CT CERVICAL SPINE WITHOUT CONTRAST TECHNIQUE: Multidetector CT imaging of the head and cervical spine was performed following the standard protocol without intravenous contrast. Multiplanar CT image reconstructions of the cervical spine were also generated. COMPARISON:  CT head 04/26/2011 FINDINGS: CT HEAD FINDINGS Brain: Moderate to advanced atrophy. Extensive chronic microvascular ischemic change throughout the white matter. Chronic infarct in the left anterior basal ganglia. Chronic infarct in the right parietal cortex over the convexity. Negative for acute infarct, hemorrhage, or mass lesion. Vascular: Atherosclerotic calcification. Skull: Negative with Sinuses/Orbits: Small air-fluid level in the sphenoid sinus. Remaining sinuses  clear. Normal orbital structures. Other: None CT CERVICAL SPINE FINDINGS Alignment: Normal alignment.  Cervical kyphosis at C5-6. Skull base and vertebrae: Negative for fracture. No acute skeletal abnormality. Soft tissues and spinal canal: Arterial calcification. No mass or adenopathy in the neck. Disc levels: Disc degeneration and spurring at C4-5, C5-6, and C6-7. Spinal stenosis at C5-6. Multilevel facet degeneration. Upper chest: Negative Other: None IMPRESSION: Advanced atrophy and chronic ischemia. No acute intracranial abnormality. Cervical degenerative changes. Negative for cervical spine fracture. Electronically Signed   By: Franchot Gallo M.D.   On: 07/11/2016 10:27   Ct Cervical Spine Wo Contrast  Result Date: 07/11/2016 CLINICAL DATA:  Fall.  Head injury. EXAM: CT HEAD WITHOUT CONTRAST CT CERVICAL SPINE WITHOUT CONTRAST TECHNIQUE: Multidetector CT imaging of the head and cervical spine was performed following the standard protocol without intravenous contrast. Multiplanar CT image reconstructions of the cervical spine were also generated. COMPARISON:  CT head 04/26/2011 FINDINGS: CT HEAD FINDINGS Brain: Moderate to advanced atrophy. Extensive chronic microvascular ischemic change throughout the white matter. Chronic infarct in the left anterior basal ganglia. Chronic infarct in the right parietal cortex over the convexity. Negative for acute infarct, hemorrhage, or mass lesion. Vascular: Atherosclerotic calcification. Skull: Negative with Sinuses/Orbits: Small air-fluid level in the sphenoid sinus. Remaining sinuses clear. Normal orbital structures. Other: None CT CERVICAL SPINE FINDINGS Alignment: Normal alignment.  Cervical kyphosis at C5-6. Skull base and vertebrae: Negative for fracture. No acute skeletal abnormality. Soft tissues and spinal canal: Arterial calcification. No mass or adenopathy in the neck. Disc levels: Disc degeneration and spurring at C4-5, C5-6, and C6-7. Spinal stenosis at  C5-6. Multilevel facet degeneration. Upper chest: Negative Other: None IMPRESSION: Advanced atrophy and chronic ischemia. No acute intracranial abnormality. Cervical degenerative changes. Negative for cervical spine fracture. Electronically Signed   By: Franchot Gallo M.D.   On: 07/11/2016 10:27   Dg Finger Thumb Left  Result Date: 07/11/2016 CLINICAL DATA:  Persistent pain following fall 2 days prior EXAM: LEFT THUMB 2+V COMPARISON:  None. FINDINGS: Frontal, oblique, and lateral views were obtained. There is no acute fracture or dislocation. There is moderate osteoarthritic change in the first MCP and IP joints. There is also osteoarthritic change in the scaphotrapezial and first carpal -metacarpal joints. Bones are osteoporotic. No erosive change. IMPRESSION: Multifocal osteoarthritic change. Bones are osteoporotic. No acute fracture or dislocation. Electronically Signed   By: Lowella Grip III M.D.  On: 07/11/2016 09:32    Procedures Procedures (including critical care time)  Medications Ordered in ED Medications - No data to display   Initial Impression / Assessment and Plan / ED Course  I have reviewed the triage vital signs and the nursing notes.  Pertinent labs & imaging results that were available during my care of the patient were reviewed by me and considered in my medical decision making (see chart for details).  Clinical Course     No thumb fracture. Normal CT of the head and neck for acute processes. Plan symptomatic treatment. Thumb spica splint placed.  Final Clinical Impressions(s) / ED Diagnoses   Final diagnoses:  Contusion of forehead, initial encounter  Sprain of left thumb, unspecified site of finger, initial encounter    New Prescriptions New Prescriptions   No medications on file     Tanna Furry, MD 07/11/16 1054

## 2016-07-11 NOTE — ED Notes (Signed)
Pharmacy stated that patient reported to her that patient's blood sugar was running high this morning. States it was 140's

## 2016-07-11 NOTE — ED Triage Notes (Signed)
Pt c/o head injury and left thumb injury onset 07/09/2016 after her walker slipped out from under her and she fell, striking forehead on wooden piano bench, injuring left thumb. No anticoagulant therapy. No LOC.

## 2016-07-11 NOTE — Discharge Instructions (Signed)
Wear splint for comfort. You may remove it each day and gently move your thumb.  When pain and swelling resolve, you may discontinue splint.

## 2016-07-21 ENCOUNTER — Ambulatory Visit (INDEPENDENT_AMBULATORY_CARE_PROVIDER_SITE_OTHER): Payer: Medicare Other | Admitting: Ophthalmology

## 2016-07-22 ENCOUNTER — Inpatient Hospital Stay (HOSPITAL_COMMUNITY)
Admission: EM | Admit: 2016-07-22 | Discharge: 2016-07-25 | DRG: 872 | Disposition: A | Discharge status: Home / Self Care | Payer: Medicare Other | Attending: Family Medicine | Admitting: Family Medicine

## 2016-07-22 ENCOUNTER — Encounter (HOSPITAL_COMMUNITY): Payer: Self-pay | Admitting: Family Medicine

## 2016-07-22 ENCOUNTER — Emergency Department (HOSPITAL_COMMUNITY): Payer: Medicare Other

## 2016-07-22 DIAGNOSIS — K5900 Constipation, unspecified: Secondary | ICD-10-CM | POA: Diagnosis present

## 2016-07-22 DIAGNOSIS — Z6841 Body Mass Index (BMI) 40.0 and over, adult: Secondary | ICD-10-CM | POA: Diagnosis not present

## 2016-07-22 DIAGNOSIS — I69398 Other sequelae of cerebral infarction: Secondary | ICD-10-CM | POA: Diagnosis not present

## 2016-07-22 DIAGNOSIS — I10 Essential (primary) hypertension: Secondary | ICD-10-CM | POA: Diagnosis present

## 2016-07-22 DIAGNOSIS — E669 Obesity, unspecified: Secondary | ICD-10-CM | POA: Diagnosis present

## 2016-07-22 DIAGNOSIS — E1169 Type 2 diabetes mellitus with other specified complication: Secondary | ICD-10-CM

## 2016-07-22 DIAGNOSIS — Z955 Presence of coronary angioplasty implant and graft: Secondary | ICD-10-CM | POA: Diagnosis not present

## 2016-07-22 DIAGNOSIS — N390 Urinary tract infection, site not specified: Secondary | ICD-10-CM | POA: Diagnosis not present

## 2016-07-22 DIAGNOSIS — N183 Chronic kidney disease, stage 3 (moderate): Secondary | ICD-10-CM | POA: Diagnosis present

## 2016-07-22 DIAGNOSIS — Z79899 Other long term (current) drug therapy: Secondary | ICD-10-CM

## 2016-07-22 DIAGNOSIS — Z794 Long term (current) use of insulin: Secondary | ICD-10-CM | POA: Diagnosis not present

## 2016-07-22 DIAGNOSIS — Z833 Family history of diabetes mellitus: Secondary | ICD-10-CM | POA: Diagnosis not present

## 2016-07-22 DIAGNOSIS — A419 Sepsis, unspecified organism: Secondary | ICD-10-CM | POA: Diagnosis present

## 2016-07-22 DIAGNOSIS — E785 Hyperlipidemia, unspecified: Secondary | ICD-10-CM | POA: Diagnosis present

## 2016-07-22 DIAGNOSIS — N3000 Acute cystitis without hematuria: Secondary | ICD-10-CM

## 2016-07-22 DIAGNOSIS — J988 Other specified respiratory disorders: Secondary | ICD-10-CM

## 2016-07-22 DIAGNOSIS — H5462 Unqualified visual loss, left eye, normal vision right eye: Secondary | ICD-10-CM | POA: Diagnosis present

## 2016-07-22 DIAGNOSIS — E1140 Type 2 diabetes mellitus with diabetic neuropathy, unspecified: Secondary | ICD-10-CM

## 2016-07-22 DIAGNOSIS — F0390 Unspecified dementia without behavioral disturbance: Secondary | ICD-10-CM | POA: Diagnosis present

## 2016-07-22 DIAGNOSIS — Z8249 Family history of ischemic heart disease and other diseases of the circulatory system: Secondary | ICD-10-CM | POA: Diagnosis not present

## 2016-07-22 DIAGNOSIS — I350 Nonrheumatic aortic (valve) stenosis: Secondary | ICD-10-CM | POA: Diagnosis present

## 2016-07-22 DIAGNOSIS — Z96652 Presence of left artificial knee joint: Secondary | ICD-10-CM

## 2016-07-22 DIAGNOSIS — I77810 Thoracic aortic ectasia: Secondary | ICD-10-CM | POA: Diagnosis present

## 2016-07-22 DIAGNOSIS — I2510 Atherosclerotic heart disease of native coronary artery without angina pectoris: Secondary | ICD-10-CM | POA: Diagnosis present

## 2016-07-22 DIAGNOSIS — Z91040 Latex allergy status: Secondary | ICD-10-CM

## 2016-07-22 DIAGNOSIS — I482 Chronic atrial fibrillation: Secondary | ICD-10-CM

## 2016-07-22 DIAGNOSIS — I4821 Permanent atrial fibrillation: Secondary | ICD-10-CM | POA: Diagnosis present

## 2016-07-22 DIAGNOSIS — I129 Hypertensive chronic kidney disease with stage 1 through stage 4 chronic kidney disease, or unspecified chronic kidney disease: Secondary | ICD-10-CM | POA: Diagnosis present

## 2016-07-22 DIAGNOSIS — B9620 Unspecified Escherichia coli [E. coli] as the cause of diseases classified elsewhere: Secondary | ICD-10-CM | POA: Diagnosis present

## 2016-07-22 LAB — CBC WITH DIFFERENTIAL/PLATELET
Basophils Absolute: 0.1 10*3/uL (ref 0.0–0.1)
Basophils Relative: 0 %
Eosinophils Absolute: 0.1 10*3/uL (ref 0.0–0.7)
Eosinophils Relative: 0 %
HCT: 38.5 % (ref 36.0–46.0)
Hemoglobin: 12.8 g/dL (ref 12.0–15.0)
Lymphocytes Relative: 4 %
Lymphs Abs: 0.5 10*3/uL — ABNORMAL LOW (ref 0.7–4.0)
MCH: 27.2 pg (ref 26.0–34.0)
MCHC: 33.2 g/dL (ref 30.0–36.0)
MCV: 81.9 fL (ref 78.0–100.0)
Monocytes Absolute: 1 10*3/uL (ref 0.1–1.0)
Monocytes Relative: 7 %
Neutro Abs: 12 10*3/uL — ABNORMAL HIGH (ref 1.7–7.7)
Neutrophils Relative %: 88 %
Platelets: 244 10*3/uL (ref 150–400)
RBC: 4.7 MIL/uL (ref 3.87–5.11)
RDW: 16.1 % — ABNORMAL HIGH (ref 11.5–15.5)
WBC: 13.6 10*3/uL — ABNORMAL HIGH (ref 4.0–10.5)

## 2016-07-22 LAB — BASIC METABOLIC PANEL
Anion gap: 10 (ref 5–15)
BUN: 20 mg/dL (ref 6–20)
CO2: 26 mmol/L (ref 22–32)
Calcium: 9.1 mg/dL (ref 8.9–10.3)
Chloride: 102 mmol/L (ref 101–111)
Creatinine, Ser: 1.05 mg/dL — ABNORMAL HIGH (ref 0.44–1.00)
GFR calc Af Amer: 54 mL/min — ABNORMAL LOW (ref >60)
GFR calc non Af Amer: 46 mL/min — ABNORMAL LOW (ref >60)
Glucose, Bld: 118 mg/dL — ABNORMAL HIGH (ref 65–99)
Potassium: 3.5 mmol/L (ref 3.5–5.1)
Sodium: 138 mmol/L (ref 135–145)

## 2016-07-22 LAB — URINE MICROSCOPIC-ADD ON

## 2016-07-22 LAB — URINALYSIS, ROUTINE W REFLEX MICROSCOPIC
Bilirubin Urine: NEGATIVE
Glucose, UA: NEGATIVE mg/dL
Ketones, ur: NEGATIVE mg/dL
Nitrite: POSITIVE — AB
Protein, ur: NEGATIVE mg/dL
Specific Gravity, Urine: 1.015 (ref 1.005–1.030)
pH: 5.5 (ref 5.0–8.0)

## 2016-07-22 MED ORDER — ALBUTEROL SULFATE (2.5 MG/3ML) 0.083% IN NEBU
2.5000 mg | INHALATION_SOLUTION | Freq: Once | RESPIRATORY_TRACT | Status: AC
  Administered 2016-07-22: 2.5 mg via RESPIRATORY_TRACT
  Filled 2016-07-22: qty 3

## 2016-07-22 MED ORDER — ONDANSETRON HCL 4 MG/2ML IJ SOLN
4.0000 mg | Freq: Once | INTRAMUSCULAR | Status: AC
  Administered 2016-07-22: 4 mg via INTRAVENOUS
  Filled 2016-07-22: qty 2

## 2016-07-22 MED ORDER — DEXTROSE 5 % IV SOLN
1.0000 g | Freq: Once | INTRAVENOUS | Status: AC
  Administered 2016-07-22: 1 g via INTRAVENOUS
  Filled 2016-07-22: qty 10

## 2016-07-22 MED ORDER — HYDROMORPHONE HCL 1 MG/ML IJ SOLN
0.5000 mg | Freq: Once | INTRAMUSCULAR | Status: AC
  Administered 2016-07-22: 0.5 mg via INTRAVENOUS
  Filled 2016-07-22: qty 1

## 2016-07-22 MED ORDER — ACETAMINOPHEN 500 MG PO TABS
1000.0000 mg | ORAL_TABLET | Freq: Once | ORAL | Status: AC
  Administered 2016-07-22: 1000 mg via ORAL
  Filled 2016-07-22: qty 2

## 2016-07-22 MED ORDER — SODIUM CHLORIDE 0.9 % IV BOLUS (SEPSIS)
500.0000 mL | Freq: Once | INTRAVENOUS | Status: AC
  Administered 2016-07-22: 500 mL via INTRAVENOUS

## 2016-07-22 NOTE — ED Provider Notes (Signed)
[place] DEPT Provider Note   CSN: [id number] Arrival date & time: 07/22/16  1945  By signing my name below, I, Georgette Shell, attest that this documentation has been prepared under the direction and in the presence of Virgel Manifold, MD. Electronically Signed: Georgette Shell, ED Scribe. 07/22/16. 9:28 PM.  History   Chief Complaint Chief Complaint  Patient presents with  . Fatigue  . Chills   HPI Comments: CORREN GAINEY is a 82 y.o. female with h/o A-fib, asthma, CAD, DM, diverticulosis, and HTN who presents to the Emergency Department by EMS complaining of generalized weakness onset earlier today with associated chills, productive cough, and tremors. Pt reports she had just woke up from a nap at the onset of her symptoms. Pt states she has a normal appetite but notes that she is extremely thirsty. Pt's last bowel movement was yesterday morning. She has not taken any OTC medications PTA. Pt is not a smoker. Pt denies diarrhea or any other associated symptoms.   The history is provided by the patient. No language interpreter was used.    Past Medical History:  Diagnosis Date  . Aortic stenosis    mild, echo, February, 2009  . Ascending aorta dilatation (HCC)    mild, echo, February, 2009  . Asthma   . Atrial fibrillation (HCC)    Coumadin is not used per the patient because of her eyes  . Blindness of left eye    related to some type of stroke in the past  . CAD (coronary artery disease)    stents right coronary artery 2001  /  nuclear, March, 2009, no ischemia  . Colon polyp   . Diverticulosis   . DM (diabetes mellitus) ([place])   . Dyslipidemia   . Ejection fraction    EF 70%, nuclear, 2009, normal, echo, 2009  . Falling episodes   . HTN (hypertension)   . Kidney stones   . Obesity   . PUD (peptic ulcer disease)   . Ventral hernia     Patient Active Problem List   Diagnosis Date Noted  . Duodenal ulcer 11/07/2013  . Gastric ulcer with hemorrhage 11/07/2013  . Upper GI  bleed 11/06/2013  . Unspecified constipation 09/19/2013  . Family history of malignant neoplasm of gastrointestinal tract 09/19/2013  . Preop cardiovascular exam 09/11/2011  . CAD (coronary artery disease)   . Atrial fibrillation ([place])   . HTN (hypertension)   . Diverticulosis   . Colon polyp   . Aortic stenosis   . Ascending aorta dilatation (HCC)   . Asthma   . Obesity   . Dyslipidemia   . Ejection fraction   . DM (diabetes mellitus) ([place])   . Falling episodes     Past Surgical History:  Procedure Laterality Date  . APPENDECTOMY    . ESOPHAGOGASTRODUODENOSCOPY N/A 11/07/2013   Procedure: ESOPHAGOGASTRODUODENOSCOPY (EGD);  Surgeon: Lafayette Dragon, MD;  Location: [facility];  Service: Endoscopy;  Laterality: N/A;  . TONSILLECTOMY    . TOTAL ABDOMINAL HYSTERECTOMY      OB History    No data available       Home Medications    Prior to Admission medications   Medication Sig Start Date End Date Taking? Authorizing Provider  acetaminophen (TYLENOL) 650 MG CR tablet Take 650 mg by mouth every 8 (eight) hours as needed for pain.    Historical Provider, MD  ALPRAZolam Duanne Moron) 1 MG tablet Take 1 mg by mouth at bedtime.     Historical  Provider, MD  amitriptyline (ELAVIL) 10 MG tablet Take 10 mg by mouth at bedtime.    Historical Provider, MD  calcium carbonate (OS-CAL - DOSED IN MG OF ELEMENTAL CALCIUM) 1250 (500 Ca) MG tablet Take 1 tablet by mouth daily with breakfast.    Historical Provider, MD  donepezil (ARICEPT) 10 MG tablet Take 10 mg by mouth at bedtime.     Historical Provider, MD  insulin NPH-insulin regular (NOVOLIN 70/30) (70-30) 100 UNIT/ML injection Inject 15-30 Units into the skin 2 (two) times daily with a meal. 30 units in the morning and 15 units at night    Historical Provider, MD  losartan-hydrochlorothiazide (HYZAAR) 100-12.5 MG tablet Take 1 tablet by mouth daily with breakfast. 06/25/16   Historical Provider, MD  metFORMIN (GLUCOPHAGE) 500 MG tablet Take 500  mg by mouth 2 (two) times daily with a meal.    Historical Provider, MD  nystatin cream (MYCOSTATIN) Apply 1 application topically 3 (three) times daily as needed (for yeast infection on vagina).  06/14/16   Historical Provider, MD  oxybutynin (DITROPAN-XL) 10 MG 24 hr tablet Take 10 mg by mouth daily with breakfast.  12/29/15   Historical Provider, MD  Probiotic Product (PROBIOTIC COLON SUPPORT) CAPS Take 1 capsule by mouth daily with breakfast.    Historical Provider, MD    Family History Family History  Problem Relation Age of Onset  . Colon cancer Mother   . Heart attack Father     34  . Diabetes Sister   . Diabetes Daughter     x 2  . Hypertension Neg Hx   . Stroke Neg Hx     Social History Social History  Substance Use Topics  . Smoking status: Never Smoker  . Smokeless tobacco: Never Used  . Alcohol use No     Allergies   Latex   Review of Systems Review of Systems  Constitutional: Positive for chills.  Respiratory: Positive for cough.   Gastrointestinal: Negative for diarrhea.  Neurological: Positive for tremors and weakness.  All other systems reviewed and are negative.    Physical Exam Updated Vital Signs BP 179/71 (BP Location: Right Arm)   Pulse 93   Temp 98.8 F (37.1 C) (Oral)   Resp 18   Ht 4\' 11"  (1.499 m)   Wt 200 lb (90.7 kg)   SpO2 97% Comment: RA  BMI 40.40 kg/m   Physical Exam  Constitutional: She is oriented to person, place, and time. She appears well-developed and well-nourished. No distress.  HENT:  Head: Normocephalic and atraumatic.  Nose: Nose normal.  Mouth/Throat: Oropharynx is clear and moist. No oropharyngeal exudate.  Eyes: Conjunctivae and EOM are normal. Pupils are equal, round, and reactive to light. No scleral icterus.  Neck: Normal range of motion. Neck supple. No JVD present. No tracheal deviation present. No thyromegaly present.  Cardiovascular: Normal rate, regular rhythm and normal heart sounds.  Exam reveals no  gallop and no friction rub.   No murmur heard. Pulmonary/Chest: Effort normal. No respiratory distress. She has wheezes. She exhibits no tenderness.  Expiratory wheezing bilaterally.  Abdominal: Soft. Bowel sounds are normal. She exhibits no distension and no mass. There is tenderness. There is no rebound and no guarding.  Suprapubic and LLQ tenderness.  Musculoskeletal: Normal range of motion. She exhibits no edema or tenderness.  Lymphadenopathy:    She has no cervical adenopathy.  Neurological: She is alert and oriented to person, place, and time. No cranial nerve deficit. She exhibits normal  muscle tone.  Skin: Skin is warm and dry. No rash noted. No erythema. No pallor.  Nursing note and vitals reviewed.    ED Treatments / Results  DIAGNOSTIC STUDIES: Oxygen Saturation is 97% on RA, adequate by my interpretation.    COORDINATION OF CARE: 9:25 PM Discussed treatment plan with pt at bedside which includes CXR, blood work, and IV fluids and pt agreed to plan.  Labs (all labs ordered are listed, but only abnormal results are displayed) Labs Reviewed  CBC WITH DIFFERENTIAL/PLATELET - Abnormal; Notable for the following:       Result Value   WBC 13.6 (*)    RDW 16.1 (*)    Neutro Abs 12.0 (*)    Lymphs Abs 0.5 (*)    All other components within normal limits  BASIC METABOLIC PANEL - Abnormal; Notable for the following:    Glucose, Bld 118 (*)    Creatinine, Ser 1.05 (*)    GFR calc non Af Amer 46 (*)    GFR calc Af Amer 54 (*)    All other components within normal limits  URINALYSIS, ROUTINE W REFLEX MICROSCOPIC (NOT AT [facility]) - Abnormal; Notable for the following:    APPearance CLOUDY (*)    Hgb urine dipstick MODERATE (*)    Nitrite POSITIVE (*)    Leukocytes, UA TRACE (*)    All other components within normal limits  URINE MICROSCOPIC-ADD ON - Abnormal; Notable for the following:    Squamous Epithelial / LPF 0-5 (*)    Bacteria, UA MANY (*)    All other components  within normal limits  URINE CULTURE    EKG  EKG Interpretation None       Radiology Dg Chest 2 View  Result Date: 07/22/2016 CLINICAL DATA:  Fever and chills for 24 hours. Weakness and shortness of breath. Fall on 07/09/2016. Right shoulder has been feeling funny since then. Difficulty raising the right arm. Cough. EXAM: CHEST  2 VIEW COMPARISON:  12/06/2015 FINDINGS: Shallow inspiration with atelectasis in the lung bases. Normal heart size and pulmonary vascularity. No focal airspace disease or consolidation in the lungs. No blunting of costophrenic angles. No pneumothorax. Calcification of the aorta. Degenerative changes in the thoracic spine and in the shoulders. Old compression deformity post kyphoplasty in the upper lumbar spine. Postoperative changes in the left shoulder. Old fracture deformity of the right shoulder. IMPRESSION: Shallow inspiration with atelectasis in the lung bases. No focal consolidation. Electronically Signed   By: Lucienne Capers M.D.   On: 07/22/2016 21:52    Procedures Procedures (including critical care time)  Medications Ordered in ED Medications - No data to display   Initial Impression / Assessment and Plan / ED Course  I have reviewed the triage vital signs and the nursing notes.  Pertinent labs & imaging results that were available during my care of the patient were reviewed by me and considered in my medical decision making (see chart for details).  Clinical Course     89yF with chills and generalized weakness. Febrile. Has UTI. Rocephin. Also cough and wheezing on exam. Suspect viral respiratory infection. CXR clear. She is old, is having difficulty getting around and lives at home. Wil discuss with medicine for admission.   Final Clinical Impressions(s) / ED Diagnoses   Final diagnoses:  Lower urinary tract infectious disease  Respiratory infection    New Prescriptions New Prescriptions   No medications on file   I personally  preformed the services scribed in my  presence. The recorded information has been reviewed is accurate. Virgel Manifold, MD.    Virgel Manifold, MD 07/22/16 [phone]

## 2016-07-22 NOTE — ED Triage Notes (Signed)
Patient is from home and transported via [facility]. Pt is complaining of cold chills, generalized weakness, and shakes. Per EMS, pt was orthostatic positive. Also, had a strong foul smell to urine.

## 2016-07-22 NOTE — ED Notes (Signed)
Hospitalist at bedside 

## 2016-07-22 NOTE — ED Notes (Signed)
Provided patient a cup of ice water with permission from Dr. Wilson Singer.

## 2016-07-22 NOTE — H&P (Signed)
History and Physical  Patient Name: Cassidy [name]     [id number]    DOB: Jul 06, 1927    DOA: 07/22/2016 PCP: [name], MD   Patient coming from: Home  Chief Complaint: Malaise, aches  HPI: Cassidy [name] is a 82 y.o. female with a past medical history significant for Afib not on anticoagulation, obesity, HTN, recurrent UTI and IDDM who presents with malaise for 1 week worsened today.  The patient was in her usual state of health until one week ago when she started to develop myalgias, vague abdominal discomfort, and increased urinary frequency. She has had the symptoms when she had a UTI in the past. Symptoms persisted until this afternoon, she took a nap, and when she woke up she felt, shaky "like I was having a low blood sugar episode, tremulous, weak, and so she came to the ER.  In the background, the patient has also had some cough, cough productive of mucus, and a sick contact in her great granddaughter.  ED course: -Febrile to 101.64F, heart rate 90s, respiratory rate normal, blood pressure 180/70, pulse ox 90% on room air -Na 138, K 3.5, Cr 1.05, WBC 13.6K, Hgb 12.8 -Chest x-ray showed no focal opacity -Urinalysis showed nitrates, leukocytes, many bacteria -She was given ceftriaxone and TRH were asked to evaluate     ROS: Review of Systems  Constitutional: Positive for fever and malaise/fatigue.  HENT: Negative for congestion and sore throat.   Respiratory: Positive for cough and sputum production. Negative for hemoptysis, shortness of breath and wheezing.   Gastrointestinal: Positive for abdominal pain (vague). Negative for blood in stool, constipation, diarrhea, nausea and vomiting.  Genitourinary: Positive for frequency (she is contradictory when answering if this is chronic or new). Negative for dysuria, flank pain, hematuria and urgency.  Musculoskeletal: Positive for myalgias.  Neurological: Positive for dizziness and weakness. Negative for loss of  consciousness.  All other systems reviewed and are negative.         Past Medical History:  Diagnosis Date  . Aortic stenosis    mild, echo, February, 2009  . Ascending aorta dilatation (HCC)    mild, echo, February, 2009  . Asthma   . Atrial fibrillation (HCC)    Coumadin is not used per the patient because of her eyes  . Blindness of left eye    related to some type of stroke in the past  . CAD (coronary artery disease)    stents right coronary artery 2001  /  nuclear, March, 2009, no ischemia  . Colon polyp   . Diverticulosis   . DM (diabetes mellitus) ([place])   . Dyslipidemia   . Ejection fraction    EF 70%, nuclear, 2009, normal, echo, 2009  . Falling episodes   . HTN (hypertension)   . Kidney stones   . Obesity   . PUD (peptic ulcer disease)   . Ventral hernia     Past Surgical History:  Procedure Laterality Date  . APPENDECTOMY    . ESOPHAGOGASTRODUODENOSCOPY N/A 11/07/2013   Procedure: ESOPHAGOGASTRODUODENOSCOPY (EGD);  Surgeon: Lafayette Dragon, MD;  Location: [facility];  Service: Endoscopy;  Laterality: N/A;  . TONSILLECTOMY    . TOTAL ABDOMINAL HYSTERECTOMY      Social History: Patient lives with her husband.  The patient walks unassisted.  She has never smoked, although she worked many years at [employer].  She is from [place].    Allergies  Allergen Reactions  . Latex Other (See Comments)  Unknown    Family history: family history includes Colon cancer in her mother; Diabetes in her daughter and sister; Heart attack in her father.  Prior to Admission medications   Medication Sig Start Date End Date Taking? Authorizing Provider  ALPRAZolam Duanne Moron) 1 MG tablet Take 1 mg by mouth at bedtime.    Yes Historical Provider, MD  amitriptyline (ELAVIL) 10 MG tablet Take 10 mg by mouth at bedtime.   Yes Historical Provider, MD  calcium carbonate (OS-CAL - DOSED IN MG OF ELEMENTAL CALCIUM) 1250 (500 Ca) MG tablet Take 1 tablet by mouth daily with breakfast.    Yes Historical Provider, MD  donepezil (ARICEPT) 10 MG tablet Take 10 mg by mouth at bedtime.    Yes Historical Provider, MD  insulin NPH-insulin regular (NOVOLIN 70/30) (70-30) 100 UNIT/ML injection Inject 15-30 Units into the skin 2 (two) times daily with a meal. 30 units in the morning and 15 units at night   Yes Historical Provider, MD  losartan-hydrochlorothiazide (HYZAAR) 100-12.5 MG tablet Take 1 tablet by mouth daily with breakfast. 06/25/16  Yes Historical Provider, MD  metFORMIN (GLUCOPHAGE) 500 MG tablet Take 500 mg by mouth 2 (two) times daily with a meal.   Yes Historical Provider, MD  oxybutynin (DITROPAN-XL) 10 MG 24 hr tablet Take 10 mg by mouth daily with breakfast.  12/29/15  Yes Historical Provider, MD  Probiotic Product (PROBIOTIC COLON SUPPORT) CAPS Take 1 capsule by mouth daily with breakfast.   Yes Historical Provider, MD  acetaminophen (TYLENOL) 650 MG CR tablet Take 650 mg by mouth every 8 (eight) hours as needed for pain.    Historical Provider, MD  nystatin cream (MYCOSTATIN) Apply 1 application topically 3 (three) times daily as needed (for yeast infection on vagina).  06/14/16   Historical Provider, MD       Physical Exam: BP (!) 102/44   Pulse 79   Temp 99.6 F (37.6 C) (Rectal)   Resp 12   Ht 4\' 11"  (1.499 m)   Wt 90.7 kg (200 lb)   SpO2 95%   BMI 40.40 kg/m  General appearance: Obese elderly adult female, alert and awake, weak, ill-appearing, in no acute distress.   Eyes: Anicteric, conjunctiva pink, lids and lashes normal. Right pupil normal, left pupil nonreactive.    ENT: No nasal deformity, discharge, epistaxis.  Hearing normal. OP with dry MM without lesions.   Neck: No neck masses.  Trachea midline.  No thyromegaly/tenderness. Lymph: No cervical or supraclavicular lymphadenopathy. Skin: Warm and dry.  No jaundice.  No suspicious rashes or lesions. Cardiac: RRR, nl S1-S2, 2/6 SEM radiating to carotids.  Capillary refill is brisk.  JVP not visible.   No LE edema.  Radial and DP pulses 2+ and symmetric. Respiratory: Normal respiratory rate and rhythm.  Scant wheezes and bibasilar crackles. Abdomen: Abdomen soft.  Mild suprapubic TTP, no guarding. No ascites, distension, hepatosplenomegaly.   MSK: No deformities or effusions.  No cyanosis or clubbing. Neuro: Cranial nerves normal other than left pupil which is chronic per patient.  Sensation intact to light touch. Speech is fluent.  Muscle strength globally weak, symmetric.    Psych: Sensorium intact and responding to questions, attention normal.  Behavior appropriate.  Affect ill.  Judgment and insight appear normal.     Labs on Admission:  I have personally reviewed following labs and imaging studies: CBC:  Recent Labs Lab 07/22/16 2158  WBC 13.6*  NEUTROABS 12.0*  HGB 12.8  HCT 38.5  MCV 81.9  PLT [id number]   Basic Metabolic Panel:  Recent Labs Lab 07/22/16 2158  NA 138  K 3.5  CL 102  CO2 26  GLUCOSE 118*  BUN 20  CREATININE 1.05*  CALCIUM 9.1   GFR: Estimated Creatinine Clearance: 37.1 mL/min (by C-G formula based on SCr of 1.05 mg/dL (H)).  Liver Function Tests: No results for input(s): AST, ALT, ALKPHOS, BILITOT, PROT, ALBUMIN in the last 168 hours. No results for input(s): LIPASE, AMYLASE in the last 168 hours. No results for input(s): AMMONIA in the last 168 hours. Coagulation Profile: No results for input(s): INR, PROTIME in the last 168 hours. Cardiac Enzymes: No results for input(s): CKTOTAL, CKMB, CKMBINDEX, TROPONINI in the last 168 hours. BNP (last 3 results) No results for input(s): PROBNP in the last 8760 hours. HbA1C: No results for input(s): HGBA1C in the last 72 hours. CBG: No results for input(s): GLUCAP in the last 168 hours. Lipid Profile: No results for input(s): CHOL, HDL, LDLCALC, TRIG, CHOLHDL, LDLDIRECT in the last 72 hours. Thyroid Function Tests: No results for input(s): TSH, T4TOTAL, FREET4, T3FREE, THYROIDAB in the last 72  hours. Anemia Panel: No results for input(s): VITAMINB12, FOLATE, FERRITIN, TIBC, IRON, RETICCTPCT in the last 72 hours. Sepsis Labs: Lactate pending Invalid input(s): PROCALCITONIN, LACTICIDVEN No results found for this or any previous visit (from the past 240 hour(s)).       Radiological Exams on Admission: Personally reviewed CXR shows no focal consolidation: Dg Chest 2 View  Result Date: 07/22/2016 CLINICAL DATA:  Fever and chills for 24 hours. Weakness and shortness of breath. Fall on 07/09/2016. Right shoulder has been feeling funny since then. Difficulty raising the right arm. Cough. EXAM: CHEST  2 VIEW COMPARISON:  12/06/2015 FINDINGS: Shallow inspiration with atelectasis in the lung bases. Normal heart size and pulmonary vascularity. No focal airspace disease or consolidation in the lungs. No blunting of costophrenic angles. No pneumothorax. Calcification of the aorta. Degenerative changes in the thoracic spine and in the shoulders. Old compression deformity post kyphoplasty in the upper lumbar spine. Postoperative changes in the left shoulder. Old fracture deformity of the right shoulder. IMPRESSION: Shallow inspiration with atelectasis in the lung bases. No focal consolidation. Electronically Signed   By: Lucienne Capers M.D.   On: 07/22/2016 21:52        Assessment/Plan Principal Problem:   Sepsis ([place]) Active Problems:   Permanent atrial fibrillation ([place])   Essential hypertension   Type 2 diabetes mellitus with diabetic neuropathy, with long-term current use of insulin (HCC)   UTI (urinary tract infection)  1. Possible sepsis from UTI:  Suspected source UTI (recurrent UTI, abdominal pain, myalgias and increased urinary frequency). Organism unknown. Patient meets SIRS criteria given tachycardia, tachypnea, fever, leukocytosis.  Lactate ordered.  -Sepsis bundle utilized:  -Blood and urine cultures drawn  -Start targeted antibiotics with ceftriaxone, based on suspected  source of infection    -Repeat renal function and complete blood count in AM  -Code SEPSIS called to E-link  -Hold oxybutynin for now     2. Cough:  -Check procalcitonin, RVP and flu panel -Droplet precautions  3. HTN:  -Hold losartan-HCTZ until hemodynamics clearer  4. Afib:  CHADS2Vasc 6.  Family state she has had Afib "since she was a child", has never had anticoagulation.  5. IDDM:  -Hold home 70/30 -Glargine daily -SSI with meals -Hold metformin  6. CKD III:  Baseline Cr 1.1.  Stable.  7. Other medications:  -Continue alprazolam PRN -Continue donepezil -Continue amitriptyline  DVT prophylaxis: Lovenox  Code Status: FULL  Family Communication: Daugther and husband at bedside  Disposition Plan: Anticipate IV antibiotics and fluids overnight.  Follow urine culture and tailor antibiotics before dsicharge.  Follow respiratory panel. Consults called: None Admission status: INPATIENT, med surg        Medical decision making: Patient seen at 11:20 PM on 07/22/2016.  The patient was discussed with Dr. Wilson Singer.  What exists of the patient's chart was reviewed in depth and outside records from [place] were reviewed and summarized above.  Clinical condition: stable on O2 by nasal cannula, respirations comfortable, heart rate elevated and BP 100s, but currently stable and mentating well, stable for med surg floor.        Edwin Dada [facility] Pager [phone]       At the time of admission, it appears that the appropriate admission status for this patient is INPATIENT. This is judged to be reasonable and necessary in order to provide the required intensity of service to ensure the patient's safety given the presenting symptoms, physical exam findings, and initial radiographic and laboratory data in the context of their chronic comorbidities.  Together, these circumstances are felt to place her/him at high risk for further clinical  deterioration threatening life, limb, or organ. The following factors support the admission status of inpatient:   A. The patient's presenting symptoms include myalgias, malaise, weakness, abdominal discomfort, urinary frequency, cough B. The initial radiographic and laboratory data are worrisome because of leukocytosis, pyuria D. The chronic co-morbidities include insulin dependent diabetes, atrial fibrillation, hypertension, coronary disease, obesity, advanced age E. Patient requires inpatient status due to high intensity of service, high risk for further deterioration and high frequency of surveillance required because of this acute illness that poses a threat to life or bodily function. F. I certify that at the point of admission it is my clinical judgment that the patient will require inpatient hospital care spanning beyond 2 midnights from the point of admission and that early discharge would result in unnecessary risk of decompensation and readmission or threat to life, limb or bodily function.

## 2016-07-22 NOTE — ED Notes (Signed)
Bed: WA08 Expected date:  Expected time:  Means of arrival:  Comments: 82 yr old, cold, orthostatic, possible UTI

## 2016-07-23 DIAGNOSIS — A419 Sepsis, unspecified organism: Principal | ICD-10-CM

## 2016-07-23 LAB — RESPIRATORY PANEL BY PCR
ADENOVIRUS-RVPPCR: NOT DETECTED
Bordetella pertussis: NOT DETECTED
CHLAMYDOPHILA PNEUMONIAE-RVPPCR: NOT DETECTED
CORONAVIRUS NL63-RVPPCR: NOT DETECTED
CORONAVIRUS OC43-RVPPCR: NOT DETECTED
Coronavirus 229E: NOT DETECTED
Coronavirus HKU1: NOT DETECTED
INFLUENZA A-RVPPCR: NOT DETECTED
Influenza B: NOT DETECTED
METAPNEUMOVIRUS-RVPPCR: NOT DETECTED
Mycoplasma pneumoniae: NOT DETECTED
PARAINFLUENZA VIRUS 1-RVPPCR: NOT DETECTED
PARAINFLUENZA VIRUS 2-RVPPCR: NOT DETECTED
PARAINFLUENZA VIRUS 3-RVPPCR: NOT DETECTED
PARAINFLUENZA VIRUS 4-RVPPCR: NOT DETECTED
RHINOVIRUS / ENTEROVIRUS - RVPPCR: NOT DETECTED
Respiratory Syncytial Virus: NOT DETECTED

## 2016-07-23 LAB — GLUCOSE, CAPILLARY
GLUCOSE-CAPILLARY: 208 mg/dL — AB (ref 65–99)
GLUCOSE-CAPILLARY: 171 mg/dL — AB (ref 65–99)
Glucose-Capillary: 130 mg/dL — ABNORMAL HIGH (ref 65–99)
Glucose-Capillary: 172 mg/dL — ABNORMAL HIGH (ref 65–99)
Glucose-Capillary: 235 mg/dL — ABNORMAL HIGH (ref 65–99)

## 2016-07-23 LAB — CBC
HCT: 36.3 % (ref 36.0–46.0)
Hemoglobin: 11.8 g/dL — ABNORMAL LOW (ref 12.0–15.0)
MCH: 27.1 pg (ref 26.0–34.0)
MCHC: 32.5 g/dL (ref 30.0–36.0)
MCV: 83.3 fL (ref 78.0–100.0)
PLATELETS: 265 10*3/uL (ref 150–400)
RBC: 4.36 MIL/uL (ref 3.87–5.11)
RDW: 16.1 % — ABNORMAL HIGH (ref 11.5–15.5)
WBC: 15.3 10*3/uL — ABNORMAL HIGH (ref 4.0–10.5)

## 2016-07-23 LAB — BASIC METABOLIC PANEL
Anion gap: 9 (ref 5–15)
BUN: 22 mg/dL — AB (ref 6–20)
CHLORIDE: 100 mmol/L — AB (ref 101–111)
CO2: 28 mmol/L (ref 22–32)
CREATININE: 1.33 mg/dL — AB (ref 0.44–1.00)
Calcium: 8.5 mg/dL — ABNORMAL LOW (ref 8.9–10.3)
GFR calc non Af Amer: 35 mL/min — ABNORMAL LOW (ref >60)
GFR, EST AFRICAN AMERICAN: 40 mL/min — AB (ref >60)
Glucose, Bld: 182 mg/dL — ABNORMAL HIGH (ref 65–99)
Potassium: 4 mmol/L (ref 3.5–5.1)
Sodium: 137 mmol/L (ref 135–145)

## 2016-07-23 LAB — LACTIC ACID, PLASMA
Lactic Acid, Venous: 1.7 mmol/L (ref 0.5–1.9)
Lactic Acid, Venous: 1.9 mmol/L (ref 0.5–1.9)
Lactic Acid, Venous: 2.4 mmol/L (ref 0.5–1.9)

## 2016-07-23 LAB — PROCALCITONIN: Procalcitonin: 5.23 ng/mL

## 2016-07-23 MED ORDER — DOCUSATE SODIUM 100 MG PO CAPS
200.0000 mg | ORAL_CAPSULE | Freq: Once | ORAL | Status: AC
  Administered 2016-07-23: 200 mg via ORAL
  Filled 2016-07-23: qty 2

## 2016-07-23 MED ORDER — INSULIN ASPART 100 UNIT/ML ~~LOC~~ SOLN
0.0000 [IU] | Freq: Three times a day (TID) | SUBCUTANEOUS | Status: DC
  Administered 2016-07-23: 5 [IU] via SUBCUTANEOUS
  Administered 2016-07-23 – 2016-07-24 (×4): 3 [IU] via SUBCUTANEOUS
  Administered 2016-07-24 – 2016-07-25 (×2): 5 [IU] via SUBCUTANEOUS
  Administered 2016-07-25: 3 [IU] via SUBCUTANEOUS

## 2016-07-23 MED ORDER — ACETAMINOPHEN 650 MG RE SUPP: 650.0000 mg | Freq: Four times a day (QID) | RECTAL | Status: DC | PRN

## 2016-07-23 MED ORDER — ENOXAPARIN SODIUM 40 MG/0.4ML ~~LOC~~ SOLN
40.0000 mg | SUBCUTANEOUS | Status: DC
Start: 2016-07-23 — End: 2016-07-25
  Administered 2016-07-23 – 2016-07-25 (×3): 40 mg via SUBCUTANEOUS
  Filled 2016-07-23 (×3): qty 0.4

## 2016-07-23 MED ORDER — DONEPEZIL HCL 10 MG PO TABS
10.0000 mg | ORAL_TABLET | Freq: Every day | ORAL | Status: DC
  Administered 2016-07-23 – 2016-07-24 (×3): 10 mg via ORAL
  Filled 2016-07-23 (×3): qty 1

## 2016-07-23 MED ORDER — ONDANSETRON HCL 4 MG PO TABS
4.0000 mg | ORAL_TABLET | Freq: Four times a day (QID) | ORAL | Status: DC | PRN
Start: 2016-07-23 — End: 2016-07-25

## 2016-07-23 MED ORDER — INSULIN GLARGINE 100 UNIT/ML ~~LOC~~ SOLN
8.0000 [IU] | Freq: Every day | SUBCUTANEOUS | Status: DC
  Administered 2016-07-23 – 2016-07-25 (×3): 8 [IU] via SUBCUTANEOUS
  Filled 2016-07-23 (×3): qty 0.08

## 2016-07-23 MED ORDER — AMITRIPTYLINE HCL 10 MG PO TABS
10.0000 mg | ORAL_TABLET | Freq: Every day | ORAL | Status: DC
Start: 2016-07-23 — End: 2016-07-25
  Administered 2016-07-23 – 2016-07-24 (×3): 10 mg via ORAL
  Filled 2016-07-23 (×3): qty 1

## 2016-07-23 MED ORDER — SODIUM CHLORIDE 0.9 % IV BOLUS (SEPSIS)
500.0000 mL | Freq: Once | INTRAVENOUS | Status: AC
  Administered 2016-07-23: 500 mL via INTRAVENOUS

## 2016-07-23 MED ORDER — MAGNESIUM HYDROXIDE 400 MG/5ML PO SUSP
5.0000 mL | Freq: Every day | ORAL | Status: DC
  Administered 2016-07-23 – 2016-07-24 (×2): 5 mL via ORAL
  Filled 2016-07-23 (×2): qty 30

## 2016-07-23 MED ORDER — ACETAMINOPHEN 325 MG PO TABS
650.0000 mg | ORAL_TABLET | Freq: Four times a day (QID) | ORAL | Status: DC | PRN
Start: 2016-07-23 — End: 2016-07-25
  Administered 2016-07-23 – 2016-07-24 (×3): 650 mg via ORAL
  Filled 2016-07-23 (×3): qty 2

## 2016-07-23 MED ORDER — DEXTROSE 5 % IV SOLN
1.0000 g | INTRAVENOUS | Status: DC
  Administered 2016-07-23 – 2016-07-24 (×2): 1 g via INTRAVENOUS
  Filled 2016-07-23 (×3): qty 10

## 2016-07-23 MED ORDER — ONDANSETRON HCL 4 MG/2ML IJ SOLN: 4.0000 mg | Freq: Four times a day (QID) | INTRAMUSCULAR | Status: DC | PRN

## 2016-07-23 MED ORDER — DOCUSATE SODIUM 100 MG PO CAPS
100.0000 mg | ORAL_CAPSULE | Freq: Two times a day (BID) | ORAL | Status: DC
  Administered 2016-07-24: 100 mg via ORAL
  Filled 2016-07-23: qty 1

## 2016-07-23 MED ORDER — SODIUM CHLORIDE 0.9 % IV SOLN
INTRAVENOUS | Status: AC
  Administered 2016-07-23: 02:00:00 via INTRAVENOUS

## 2016-07-23 MED ORDER — ALPRAZOLAM 1 MG PO TABS
1.0000 mg | ORAL_TABLET | Freq: Every day | ORAL | Status: DC
  Administered 2016-07-23 – 2016-07-24 (×3): 1 mg via ORAL
  Filled 2016-07-23: qty 2
  Filled 2016-07-23 (×2): qty 1

## 2016-07-23 NOTE — Progress Notes (Signed)
Cassidy [name] [id number] DOB: 08/10/1927 DOA: 07/22/2016 PCP: [name], MD  Brief narrative:  82 y/o ? Prior GIB with erosive esophagitis 10/2013 on EGD + severe duodenitis Atrial fibrillation-Not On AC at some point because of visual losses? CAD stenting 2001 MOd AoS  Htn DM ty II L tkr 2012  Admitted with malaise ? Cough Febrile 101.6 WBc 13 Ua dirty CXr neg  Past medical history-As per Problem list Chart reviewed as below-  Consultants:  none  Procedures:  none  Antibiotics:  ceftriaxone   Subjective   Alert pleasant in NAD No fever nor chills currently  No cp tol diet   Objective    Interim History:   Telemetry:    Objective: Vitals:   07/22/16 2330 07/23/16 0000 07/23/16 0140 07/23/16 0557  BP: (!) 102/44 (!) 108/45 (!) 113/40 (!) 111/44  Pulse: 79 78 70 60  Resp: 12 18 18 18   Temp:   97.8 F (36.6 C) 97.7 F (36.5 C)  TempSrc:   Oral Oral  SpO2: 95% 96% 99% 94%  Weight:   84.1 kg (185 lb 6.5 oz)   Height:   4\' 11"  (1.499 m)    No intake or output data in the 24 hours ending 07/23/16 0751  Exam:  General: eomim ncat Cardiovascular: s1 s2 no m/r/g Respiratory: clinically clear with no added soudn Abdomen:  Soft nt nd no rebound no gaurding Skin no le edema Neuro intact  Data Reviewed: Basic Metabolic Panel:  Recent Labs Lab 07/22/16 2158 07/23/16 0232  NA 138 137  K 3.5 4.0  CL 102 100*  CO2 26 28  GLUCOSE 118* 182*  BUN 20 22*  CREATININE 1.05* 1.33*  CALCIUM 9.1 8.5*   Liver Function Tests: No results for input(s): AST, ALT, ALKPHOS, BILITOT, PROT, ALBUMIN in the last 168 hours. No results for input(s): LIPASE, AMYLASE in the last 168 hours. No results for input(s): AMMONIA in the last 168 hours. CBC:  Recent Labs Lab 07/22/16 2158 07/23/16 0232  WBC 13.6* 15.3*  NEUTROABS 12.0*  --   HGB 12.8 11.8*  HCT 38.5 36.3  MCV 81.9 83.3  PLT 244 265   Cardiac Enzymes: No results for input(s):  CKTOTAL, CKMB, CKMBINDEX, TROPONINI in the last 168 hours. BNP: Invalid input(s): POCBNP CBG:  Recent Labs Lab 07/23/16 0132 07/23/16 0746  GLUCAP 130* 172*    No results found for this or any previous visit (from the past 240 hour(s)).   Studies:              All Imaging reviewed and is as per above notation   Scheduled Meds: . ALPRAZolam  1 mg Oral QHS  . amitriptyline  10 mg Oral QHS  . cefTRIAXone (ROCEPHIN)  IV  1 g Intravenous Q24H  . donepezil  10 mg Oral QHS  . enoxaparin (LOVENOX) injection  40 mg Subcutaneous Q24H  . insulin aspart  0-15 Units Subcutaneous TID WC  . insulin glargine  8 Units Subcutaneous Daily   Continuous Infusions: . sodium chloride 100 mL/hr at 07/23/16 0153     Assessment/Plan:  Sepsis 2/2 to presumed Urinary infection Await Urine cult, cont ceftriaxone Improving responding to this and IVF 100 cc/hr,Lactic acid down Follow WBC in am  Afib Chad2vasc2 >3 not on [place] Not on Rate control Monitor for now  Htn Cont Hyzaar 1 daily  CKD 2 Follow renal fucntion in am Stable currently  Dementia-appears mild.  Cont aricept 10 qhs, xanax 1 qhs, ,  elevail 10 qhs  DM ty ii Holding metfromin 500 bid SSI   No family Inpatient Home 48 hrs if all well lovenox  Verneita Griffes, MD  [facility] Pager [phone] 07/23/2016, 7:51 AM    LOS: 1 day

## 2016-07-23 NOTE — ED Notes (Signed)
Attempted to call report but Ale, RN is unable to accept. He will call back.

## 2016-07-23 NOTE — Progress Notes (Signed)
Critical Lab notification completed to K. Schorr via [email or web address].

## 2016-07-23 NOTE — Progress Notes (Signed)
Pharmacy Antibiotic Note  Cassidy [name] is a 82 y.o. female with hx of A-fib not on anticoagulation, obesity, HTN, recurrent UTI and IDDM presents with 1 week hx of malaise  admitted on 07/22/2016 with UTI.  Pharmacy has been consulted for rocephin dosing.  Plan: Rocephin 1Gm IV q24h  Height: 4\' 11"  (149.9 cm) Weight: 200 lb (90.7 kg) IBW/kg (Calculated) : 43.2  Temp (24hrs), Avg:100 F (37.8 C), Min:98.8 F (37.1 C), Max:101.6 F (38.7 C)   Recent Labs Lab 07/22/16 2158 07/22/16 2355  WBC 13.6*  --   CREATININE 1.05*  --   LATICACIDVEN  --  1.9    Estimated Creatinine Clearance: 37.1 mL/min (by C-G formula based on SCr of 1.05 mg/dL (H)).    Allergies  Allergen Reactions  . Latex Other (See Comments)    Unknown    Antimicrobials this admission: 11/21 rocephin >>    >>   Dose adjustments this admission:   Microbiology results:  BCx:   UCx:    Sputum:    MRSA PCR:   Thank you for allowing pharmacy to be a part of this patient's care. Rx will sign off as no further adjustments are needed.   Dorrene German 07/23/2016 1:35 AM

## 2016-07-23 NOTE — ED Notes (Signed)
Gave report to [employer], Therapist, sports.

## 2016-07-24 LAB — COMPREHENSIVE METABOLIC PANEL
ALBUMIN: 3.3 g/dL — AB (ref 3.5–5.0)
ALK PHOS: 88 U/L (ref 38–126)
ALT: 30 U/L (ref 14–54)
AST: 36 U/L (ref 15–41)
Anion gap: 6 (ref 5–15)
BILIRUBIN TOTAL: 0.5 mg/dL (ref 0.3–1.2)
BUN: 25 mg/dL — AB (ref 6–20)
CALCIUM: 8.6 mg/dL — AB (ref 8.9–10.3)
CO2: 29 mmol/L (ref 22–32)
CREATININE: 1.21 mg/dL — AB (ref 0.44–1.00)
Chloride: 101 mmol/L (ref 101–111)
GFR calc Af Amer: 45 mL/min — ABNORMAL LOW (ref >60)
GFR calc non Af Amer: 39 mL/min — ABNORMAL LOW (ref >60)
GLUCOSE: 211 mg/dL — AB (ref 65–99)
Potassium: 4.2 mmol/L (ref 3.5–5.1)
SODIUM: 136 mmol/L (ref 135–145)
TOTAL PROTEIN: 6.4 g/dL — AB (ref 6.5–8.1)

## 2016-07-24 LAB — GLUCOSE, CAPILLARY
GLUCOSE-CAPILLARY: 192 mg/dL — AB (ref 65–99)
Glucose-Capillary: 215 mg/dL — ABNORMAL HIGH (ref 65–99)
Glucose-Capillary: 198 mg/dL — ABNORMAL HIGH (ref 65–99)
Glucose-Capillary: 207 mg/dL — ABNORMAL HIGH (ref 65–99)

## 2016-07-24 LAB — HEMOGLOBIN A1C
HEMOGLOBIN A1C: 5.9 % — AB (ref 4.8–5.6)
MEAN PLASMA GLUCOSE: 123 mg/dL

## 2016-07-24 LAB — CBC
HEMATOCRIT: 38.8 % (ref 36.0–46.0)
HEMOGLOBIN: 12.5 g/dL (ref 12.0–15.0)
MCH: 26.9 pg (ref 26.0–34.0)
MCHC: 32.2 g/dL (ref 30.0–36.0)
MCV: 83.4 fL (ref 78.0–100.0)
Platelets: 291 10*3/uL (ref 150–400)
RBC: 4.65 MIL/uL (ref 3.87–5.11)
RDW: 16.4 % — ABNORMAL HIGH (ref 11.5–15.5)
WBC: 9.8 10*3/uL (ref 4.0–10.5)

## 2016-07-24 LAB — PROCALCITONIN: PROCALCITONIN: 3.55 ng/mL

## 2016-07-24 MED ORDER — DOCUSATE SODIUM 100 MG PO CAPS
200.0000 mg | ORAL_CAPSULE | Freq: Two times a day (BID) | ORAL | Status: DC
  Administered 2016-07-24 – 2016-07-25 (×2): 200 mg via ORAL
  Filled 2016-07-24 (×2): qty 2

## 2016-07-24 MED ORDER — GUAIFENESIN ER 600 MG PO TB12
600.0000 mg | ORAL_TABLET | Freq: Two times a day (BID) | ORAL | Status: DC
  Administered 2016-07-24 – 2016-07-25 (×2): 600 mg via ORAL
  Filled 2016-07-24 (×2): qty 1

## 2016-07-24 MED ORDER — SORBITOL 70 % SOLN: 20.0000 mL | Freq: Every day | Status: DC | PRN

## 2016-07-24 NOTE — Progress Notes (Signed)
Cassidy [name] [id number] DOB: 12-15-1926 DOA: 07/22/2016 PCP: [name], MD  Brief narrative:  82 y/o ? Prior GIB with erosive esophagitis 10/2013 on EGD + severe duodenitis Atrial fibrillation-Not On AC at some point because of visual losses? CAD stenting 2001 Mod AoS  Htn DM ty II L tkr 2012  Admitted with malaise, weakness and felt like CBG was low Not checked at time of admit or on EMT truck Felt like prior UTI she has had in the past Also Cough Febrile 101.6 WBc 13 Ua dirty  CXR was neg  Past medical history-As per Problem list Chart reviewed as below-  Consultants:  none  Procedures:  none  Antibiotics:  ceftriaxone   Subjective   Feels fair Get's shakey when getting up out of bed, not dizzy Feels unsteady on her feet Walked to toilet and used her walker Hasn't had a proper stool since has been here and is requesting soemthing stronger Coughing a bit but nothuing really is coming up--Feels sinus pre4ssur eon the R side of face over maxilla   Objective    Interim History:   Telemetry:    Objective: Vitals:   07/23/16 0557 07/23/16 1401 07/23/16 2311 07/24/16 0617  BP: (!) 111/44 (!) 103/47 (!) 149/66 140/90  Pulse: 60 60 84 87  Resp: 18 16 18 18   Temp: 97.7 F (36.5 C) 97.8 F (36.6 C) 98.9 F (37.2 C) 97.9 F (36.6 C)  TempSrc: Oral Oral Oral Oral  SpO2: 94% 96% 94% 96%  Weight:      Height:        Intake/Output Summary (Last 24 hours) at 07/24/16 0742 Last data filed at 07/24/16 0400  Gross per 24 hour  Intake             1230 ml  Output              150 ml  Net             1080 ml    Exam:  General: eomim ncat Cardiovascular: s1 s2 no m/r/g Maxillary tenderness over R maxilla Respiratory: clinically clear with no added soudn Abdomen:  Soft nt nd no rebound no gaurding Skin no le edema Neuro intact  Data Reviewed: Basic Metabolic Panel:  Recent Labs Lab 07/22/16 2158 07/23/16 0232 07/24/16 0409  NA  138 137 136  K 3.5 4.0 4.2  CL 102 100* 101  CO2 26 28 29   GLUCOSE 118* 182* 211*  BUN 20 22* 25*  CREATININE 1.05* 1.33* 1.21*  CALCIUM 9.1 8.5* 8.6*   Liver Function Tests:  Recent Labs Lab 07/24/16 0409  AST 36  ALT 30  ALKPHOS 88  BILITOT 0.5  PROT 6.4*  ALBUMIN 3.3*   No results for input(s): LIPASE, AMYLASE in the last 168 hours. No results for input(s): AMMONIA in the last 168 hours. CBC:  Recent Labs Lab 07/22/16 2158 07/23/16 0232 07/24/16 0409  WBC 13.6* 15.3* 9.8  NEUTROABS 12.0*  --   --   HGB 12.8 11.8* 12.5  HCT 38.5 36.3 38.8  MCV 81.9 83.3 83.4  PLT 244 265 291   Cardiac Enzymes: No results for input(s): CKTOTAL, CKMB, CKMBINDEX, TROPONINI in the last 168 hours. BNP: Invalid input(s): POCBNP CBG:  Recent Labs Lab 07/23/16 0132 07/23/16 0746 07/23/16 1210 07/23/16 1705 07/23/16 2245  GLUCAP 130* 172* 208* 171* 235*    Recent Results (from the past 240 hour(s))  Respiratory Panel by PCR     Status:  None   Collection Time: 07/23/16 12:05 AM  Result Value Ref Range Status   Adenovirus NOT DETECTED NOT DETECTED Final   Coronavirus 229E NOT DETECTED NOT DETECTED Final   Coronavirus HKU1 NOT DETECTED NOT DETECTED Final   Coronavirus NL63 NOT DETECTED NOT DETECTED Final   Coronavirus OC43 NOT DETECTED NOT DETECTED Final   Metapneumovirus NOT DETECTED NOT DETECTED Final   Rhinovirus / Enterovirus NOT DETECTED NOT DETECTED Final   Influenza A NOT DETECTED NOT DETECTED Final   Influenza B NOT DETECTED NOT DETECTED Final   Parainfluenza Virus 1 NOT DETECTED NOT DETECTED Final   Parainfluenza Virus 2 NOT DETECTED NOT DETECTED Final   Parainfluenza Virus 3 NOT DETECTED NOT DETECTED Final   Parainfluenza Virus 4 NOT DETECTED NOT DETECTED Final   Respiratory Syncytial Virus NOT DETECTED NOT DETECTED Final   Bordetella pertussis NOT DETECTED NOT DETECTED Final   Chlamydophila pneumoniae NOT DETECTED NOT DETECTED Final   Mycoplasma pneumoniae NOT  DETECTED NOT DETECTED Final    Comment: Performed at [facility]     Studies:              All Imaging reviewed and is as per above notation   Scheduled Meds: . ALPRAZolam  1 mg Oral QHS  . amitriptyline  10 mg Oral QHS  . cefTRIAXone (ROCEPHIN)  IV  1 g Intravenous Q24H  . docusate sodium  100 mg Oral BID  . donepezil  10 mg Oral QHS  . enoxaparin (LOVENOX) injection  40 mg Subcutaneous Q24H  . insulin aspart  0-15 Units Subcutaneous TID WC  . insulin glargine  8 Units Subcutaneous Daily  . magnesium hydroxide  5 mL Oral Daily   Continuous Infusions:    Assessment/Plan:  Sepsis 2/2 to e.coli Urinary infection Await Urine cult from 11/21, cont ceftriaxone IV Nasal PCR for Viruses grossly neg Lactic acid and WBC down Narrow to Oral therapy in am  Afib Chad2vasc2 >3 not on [place] Not on Rate control Monitor for now  Htn Cont Hyzaar 1 daily  CKD 2 Follow renal fucntion in am Stable currently  Dementia-appears mild.  Cont aricept 10 qhs, xanax 1 qhs, , elevail 10 qhs  Constipation  Started some sorbitol 20 mg  Continue other meds as well   DM ty ii Holding metfromin 500 bid Cont Lantus 8 U SSI, sugars ranging between 211-240--will adjust as prn   D/w husband Inpatient Home 48 hrs if all well lovenox  Verneita Griffes, MD  [facility] Pager [phone] 07/24/2016, 7:42 AM    LOS: 2 days

## 2016-07-24 NOTE — Progress Notes (Signed)
Nursing Note: Report given to night nurse.wbb

## 2016-07-24 NOTE — Progress Notes (Signed)
Pt ambulating to the bathroom with walker and assist. Upset about being constipated. Pt sat up in chair about 1 1/2 hrs. Asked to ambulate in the hall pt  stated she felt like her legs were going to give out. Requested to go back to bed.Pt did have a formed stool, moderate.

## 2016-07-25 LAB — URINE CULTURE: Culture: >=100000 — AB

## 2016-07-25 LAB — BASIC METABOLIC PANEL
Anion gap: 7 (ref 5–15)
BUN: 16 mg/dL (ref 6–20)
CO2: 30 mmol/L (ref 22–32)
CREATININE: 0.95 mg/dL (ref 0.44–1.00)
Calcium: 9.1 mg/dL (ref 8.9–10.3)
Chloride: 98 mmol/L — ABNORMAL LOW (ref 101–111)
GFR, EST NON AFRICAN AMERICAN: 52 mL/min — AB (ref >60)
Glucose, Bld: 240 mg/dL — ABNORMAL HIGH (ref 65–99)
POTASSIUM: 4.1 mmol/L (ref 3.5–5.1)
SODIUM: 135 mmol/L (ref 135–145)

## 2016-07-25 LAB — GLUCOSE, CAPILLARY
GLUCOSE-CAPILLARY: 246 mg/dL — AB (ref 65–99)
Glucose-Capillary: 162 mg/dL — ABNORMAL HIGH (ref 65–99)

## 2016-07-25 MED ORDER — SORBITOL 70 % SOLN: 20.0000 mL | Freq: Every day | 0 refills | Status: AC | PRN

## 2016-07-25 MED ORDER — NITROFURANTOIN MACROCRYSTAL 100 MG PO CAPS: 100.0000 mg | ORAL_CAPSULE | Freq: Two times a day (BID) | ORAL | 0 refills | Status: AC

## 2016-07-25 NOTE — Plan of Care (Signed)
Ambulate in hall with four wheel walker and gait belt, tolerated well

## 2016-07-25 NOTE — Evaluation (Signed)
Physical Therapy Evaluation Patient Details Name: Cassidy [name] MRN: [id number] DOB: 12/24/1926 Today's Date: 07/25/2016   History of Present Illness  Cassidy [name] is a 82 y.o. female with a past medical history significant for Afib not on anticoagulation, obesity, HTN, recurrent UTI and IDDM who presents with malaise   Clinical Impression  Patient evaluated by Physical Therapy with no further acute PT needs identified. All education has been completed and the patient has no further questions.  See below for any follow-up Physical Therapy or equipment needs. PT is signing off. Thank you for this referral.pt appears to be mobilizing at her baseline, no family present at time of eval;      Follow Up Recommendations No PT follow up    Equipment Recommendations  None recommended by PT    Recommendations for Other Services       Precautions / Restrictions Precautions Precautions: Fall      Mobility  Bed Mobility Overal bed mobility: Needs Assistance Bed Mobility: Supine to Sit     Supine to sit: Min assist     General bed mobility comments: to elevate trunk  Transfers Overall transfer level: Needs assistance Equipment used: Rolling walker (2 wheeled) Transfers: Sit to/from Stand Sit to Stand: Min guard         General transfer comment: for safety  Ambulation/Gait Ambulation/Gait assistance: Min guard Ambulation Distance (Feet): 80 Feet (10' more) Assistive device: Rolling walker (2 wheeled) Gait Pattern/deviations: Step-through pattern;Decreased stride length     General Gait Details: cues for Rw position from self  Stairs            Wheelchair Mobility    Modified Rankin (Stroke Patients Only)       Balance Overall balance assessment: Needs assistance Sitting-balance support: No upper extremity supported Sitting balance-Leahy Scale: Good     Standing balance support: During functional activity;Single extremity supported Standing  balance-Leahy Scale: Fair                               Pertinent Vitals/Pain Pain Assessment: No/denies pain    Home Living Family/patient expects to be discharged to:: Private residence Living Arrangements: Spouse/significant other   Type of Home: House Home Access: Stairs to enter   [employer] of Steps: 2 Home Layout: Two level;Able to live on main level with bedroom/bathroom Home Equipment: Gilford Rile - 2 wheels;Bedside commode      Prior Function Level of Independence: Independent with assistive device(s)               Hand Dominance        Extremity/Trunk Assessment   Upper Extremity Assessment: Generalized weakness;Defer to OT evaluation           Lower Extremity Assessment: Generalized weakness         Communication   Communication: No difficulties  Cognition Arousal/Alertness: Awake/alert Behavior During Therapy: WFL for tasks assessed/performed Overall Cognitive Status: History of cognitive impairments - at baseline                      General Comments      Exercises     Assessment/Plan    PT Assessment Patient needs continued PT services  PT Problem List            PT Treatment Interventions      PT Goals (Current goals can be found in the Care Plan section)  Acute Rehab  PT Goals Patient Stated Goal: home soon PT Goal Formulation: All assessment and education complete, DC therapy (pt being D/C'd today)    Frequency     Barriers to discharge        Co-evaluation               End of Session Equipment Utilized During Treatment: Gait belt Activity Tolerance: Patient tolerated treatment well Patient left: in bed;with call bell/phone within reach;with nursing/sitter in room;with bed alarm set           Time: 1030-1049 PT Time Calculation (min) (ACUTE ONLY): 19 min   Charges:   PT Evaluation $PT Eval Low Complexity: 1 Procedure     PT G Codes:         Cassidy [name] 2016/08/24, 12:52 PM

## 2016-07-25 NOTE — Discharge Summary (Signed)
Physician Discharge Summary  Cassidy [name] [id number] DOB: February 24, 1927 DOA: 07/22/2016  PCP: [name], MD  Admit date: 07/22/2016 Discharge date: 07/25/2016  Time spent: 18 minutes  Recommendations for Outpatient Follow-up:   1. Complete Macrodantin 100 bid 11/26 2. Needs follow up labs and adjustment of PO HTN meds as OP-might benefit from alternative agent for HTN other than HCTZ 3. Labs 1 week at pcp 4. Follow with pcp 1 week  Discharge Diagnoses:  Principal Problem:   Sepsis (Cassidy [name]) Active Problems:   Permanent atrial fibrillation (Cassidy [name])   Essential hypertension   Type 2 diabetes mellitus with diabetic neuropathy, with long-term current use of insulin (Cassidy [name])   UTI (urinary tract infection)   Discharge Condition: good  Diet recommendation: hh low salt  Filed Weights   07/22/16 2005 07/23/16 0140  Weight: 90.7 kg (200 lb) 84.1 kg (185 lb 6.5 oz)    History of present illness:  82 y/o ? Prior GIB with erosive esophagitis 10/2013 on EGD + severe duodenitis Atrial fibrillation-Not On AC at some point because of visual losses? CAD stenting 2001 Mod AoS  Htn DM ty II L tkr 2012  Admitted with malaise, weakness and felt like CBG was low Not checked at time of admit or on EMT truck Felt like prior UTI she has had in the past Also Cough Febrile 101.6 WBc 13 Ua dirty  CXR was neg  Hospital Course:  Sepsis 2/2 to e.coli Urinary infection Await Urine cult from 11/21, cont ceftriaxone IV and transitioned to PO Macrodantin to complete on 11/26 as op Nasal PCR for Viruses grossly neg Lactic acid and WBC down  Afib Chad2vasc2 >3 not on [place] Not on Rate control Monitor for now  Htn Cont Hyzaar 1 daily Consider alternate agent as an OP  CKD 2 Follow renal fucntion in am Kindey function improved to 19/0.9 on d/c home  Dementia-appears mild.  Cont aricept 10 qhs, xanax 1 qhs, , elevail 10 qhs  Constipation  Started some sorbitol 20 mg  Continue  other meds as well She had good effectwith Sorbitol and given Rx for this at home  DM ty ii Holding metfromin 500 bid Cont Lantus 8 U SSI, sugars ranging between 211-240--will adjust as prn    Discharge Exam: Vitals:   07/25/16 0800 07/25/16 1100  BP: (!) 172/86 140/76  Pulse: 94 88  Resp: 18   Temp: 98 F (36.7 C) 98 F (36.7 C)    General: eomi ncat obese pleasant in nad Cardiovascular: s1 s2 no m/r/g Respiratory: clear no added sound abd soft nt nt  Discharge Instructions   Discharge Instructions    Diet - low sodium heart healthy    Complete by:  As directed    Increase activity slowly    Complete by:  As directed      Current Discharge Medication List    START taking these medications   Details  nitrofurantoin (MACRODANTIN) 100 MG capsule Take 1 capsule (100 mg total) by mouth 2 (two) times daily. Qty: 4 capsule, Refills: 0    sorbitol 70 % SOLN Take 20 mLs by mouth daily as needed for moderate constipation. Qty: 473 mL, Refills: 0      CONTINUE these medications which have NOT CHANGED   Details  ALPRAZolam (XANAX) 1 MG tablet Take 1 mg by mouth at bedtime.     amitriptyline (ELAVIL) 10 MG tablet Take 10 mg by mouth at bedtime.    calcium carbonate (OS-CAL - DOSED IN  MG OF ELEMENTAL CALCIUM) 1250 (500 Ca) MG tablet Take 1 tablet by mouth daily with breakfast.    donepezil (ARICEPT) 10 MG tablet Take 10 mg by mouth at bedtime.     insulin NPH-insulin regular (NOVOLIN 70/30) (70-30) 100 UNIT/ML injection Inject 15-30 Units into the skin 2 (two) times daily with a meal. 30 units in the morning and 15 units at night    losartan-hydrochlorothiazide (HYZAAR) 100-12.5 MG tablet Take 1 tablet by mouth daily with breakfast.    metFORMIN (GLUCOPHAGE) 500 MG tablet Take 500 mg by mouth 2 (two) times daily with a meal.    oxybutynin (DITROPAN-XL) 10 MG 24 hr tablet Take 10 mg by mouth daily with breakfast.     Probiotic Product (PROBIOTIC COLON SUPPORT)  CAPS Take 1 capsule by mouth daily with breakfast.    acetaminophen (TYLENOL) 650 MG CR tablet Take 650 mg by mouth every 8 (eight) hours as needed for pain.    nystatin cream (MYCOSTATIN) Apply 1 application topically 3 (three) times daily as needed (for yeast infection on vagina).        Allergies  Allergen Reactions  . Latex Other (See Comments)    Unknown      The results of significant diagnostics from this hospitalization (including imaging, microbiology, ancillary and laboratory) are listed below for reference.    Significant Diagnostic Studies: Dg Chest 2 View  Result Date: 07/22/2016 CLINICAL DATA:  Fever and chills for 24 hours. Weakness and shortness of breath. Fall on 07/09/2016. Right shoulder has been feeling funny since then. Difficulty raising the right arm. Cough. EXAM: CHEST  2 VIEW COMPARISON:  12/06/2015 FINDINGS: Shallow inspiration with atelectasis in the lung bases. Normal heart size and pulmonary vascularity. No focal airspace disease or consolidation in the lungs. No blunting of costophrenic angles. No pneumothorax. Calcification of the aorta. Degenerative changes in the thoracic spine and in the shoulders. Old compression deformity post kyphoplasty in the upper lumbar spine. Postoperative changes in the left shoulder. Old fracture deformity of the right shoulder. IMPRESSION: Shallow inspiration with atelectasis in the lung bases. No focal consolidation. Electronically Signed   By: Lucienne Capers M.D.   On: 07/22/2016 21:52   Ct Head Wo Contrast  Result Date: 07/11/2016 CLINICAL DATA:  Fall.  Head injury. EXAM: CT HEAD WITHOUT CONTRAST CT CERVICAL SPINE WITHOUT CONTRAST TECHNIQUE: Multidetector CT imaging of the head and cervical spine was performed following the standard protocol without intravenous contrast. Multiplanar CT image reconstructions of the cervical spine were also generated. COMPARISON:  CT head 04/26/2011 FINDINGS: CT HEAD FINDINGS Brain: Moderate  to advanced atrophy. Extensive chronic microvascular ischemic change throughout the white matter. Chronic infarct in the left anterior basal ganglia. Chronic infarct in the right parietal cortex over the convexity. Negative for acute infarct, hemorrhage, or mass lesion. Vascular: Atherosclerotic calcification. Skull: Negative with Sinuses/Orbits: Small air-fluid level in the sphenoid sinus. Remaining sinuses clear. Normal orbital structures. Other: None CT CERVICAL SPINE FINDINGS Alignment: Normal alignment.  Cervical kyphosis at C5-6. Skull base and vertebrae: Negative for fracture. No acute skeletal abnormality. Soft tissues and spinal canal: Arterial calcification. No mass or adenopathy in the neck. Disc levels: Disc degeneration and spurring at C4-5, C5-6, and C6-7. Spinal stenosis at C5-6. Multilevel facet degeneration. Upper chest: Negative Other: None IMPRESSION: Advanced atrophy and chronic ischemia. No acute intracranial abnormality. Cervical degenerative changes. Negative for cervical spine fracture. Electronically Signed   By: Franchot Gallo M.D.   On: 07/11/2016 10:27   Ct  Cervical Spine Wo Contrast  Result Date: 07/11/2016 CLINICAL DATA:  Fall.  Head injury. EXAM: CT HEAD WITHOUT CONTRAST CT CERVICAL SPINE WITHOUT CONTRAST TECHNIQUE: Multidetector CT imaging of the head and cervical spine was performed following the standard protocol without intravenous contrast. Multiplanar CT image reconstructions of the cervical spine were also generated. COMPARISON:  CT head 04/26/2011 FINDINGS: CT HEAD FINDINGS Brain: Moderate to advanced atrophy. Extensive chronic microvascular ischemic change throughout the white matter. Chronic infarct in the left anterior basal ganglia. Chronic infarct in the right parietal cortex over the convexity. Negative for acute infarct, hemorrhage, or mass lesion. Vascular: Atherosclerotic calcification. Skull: Negative with Sinuses/Orbits: Small air-fluid level in the sphenoid  sinus. Remaining sinuses clear. Normal orbital structures. Other: None CT CERVICAL SPINE FINDINGS Alignment: Normal alignment.  Cervical kyphosis at C5-6. Skull base and vertebrae: Negative for fracture. No acute skeletal abnormality. Soft tissues and spinal canal: Arterial calcification. No mass or adenopathy in the neck. Disc levels: Disc degeneration and spurring at C4-5, C5-6, and C6-7. Spinal stenosis at C5-6. Multilevel facet degeneration. Upper chest: Negative Other: None IMPRESSION: Advanced atrophy and chronic ischemia. No acute intracranial abnormality. Cervical degenerative changes. Negative for cervical spine fracture. Electronically Signed   By: Franchot Gallo M.D.   On: 07/11/2016 10:27   Dg Finger Thumb Left  Result Date: 07/11/2016 CLINICAL DATA:  Persistent pain following fall 2 days prior EXAM: LEFT THUMB 2+V COMPARISON:  None. FINDINGS: Frontal, oblique, and lateral views were obtained. There is no acute fracture or dislocation. There is moderate osteoarthritic change in the first MCP and IP joints. There is also osteoarthritic change in the scaphotrapezial and first carpal -metacarpal joints. Bones are osteoporotic. No erosive change. IMPRESSION: Multifocal osteoarthritic change. Bones are osteoporotic. No acute fracture or dislocation. Electronically Signed   By: Lowella Grip III M.D.   On: 07/11/2016 09:32    Microbiology: Recent Results (from the past 240 hour(s))  Urine culture     Status: Abnormal   Collection Time: 07/22/16  9:36 PM  Result Value Ref Range Status   Specimen Description URINE, CLEAN CATCH  Final   Special Requests NONE  Final   Culture >=100,000 COLONIES/mL ESCHERICHIA COLI (A)  Final   Report Status 07/25/2016 FINAL  Final   Organism ID, Bacteria ESCHERICHIA COLI (A)  Final      Susceptibility   Escherichia coli - MIC*    AMPICILLIN 8 SENSITIVE Sensitive     CEFAZOLIN <=4 SENSITIVE Sensitive     CEFTRIAXONE <=1 SENSITIVE Sensitive      CIPROFLOXACIN <=0.25 SENSITIVE Sensitive     GENTAMICIN <=1 SENSITIVE Sensitive     IMIPENEM <=0.25 SENSITIVE Sensitive     NITROFURANTOIN <=16 SENSITIVE Sensitive     TRIMETH/SULFA >=320 RESISTANT Resistant     AMPICILLIN/SULBACTAM 4 SENSITIVE Sensitive     PIP/TAZO <=4 SENSITIVE Sensitive     Extended ESBL NEGATIVE Sensitive     * >=100,000 COLONIES/mL ESCHERICHIA COLI  Culture, blood (single)     Status: None (Preliminary result)   Collection Time: 07/22/16 11:36 PM  Result Value Ref Range Status   Specimen Description BLOOD LEFT HAND  Final   Special Requests IN PEDIATRIC BOTTLE 3CC  Final   Culture   Final    NO GROWTH 1 DAY Performed at [facility]    Report Status PENDING  Incomplete  Respiratory Panel by PCR     Status: None   Collection Time: 07/23/16 12:05 AM  Result Value Ref Range Status  Adenovirus NOT DETECTED NOT DETECTED Final   Coronavirus 229E NOT DETECTED NOT DETECTED Final   Coronavirus HKU1 NOT DETECTED NOT DETECTED Final   Coronavirus NL63 NOT DETECTED NOT DETECTED Final   Coronavirus OC43 NOT DETECTED NOT DETECTED Final   Metapneumovirus NOT DETECTED NOT DETECTED Final   Rhinovirus / Enterovirus NOT DETECTED NOT DETECTED Final   Influenza A NOT DETECTED NOT DETECTED Final   Influenza B NOT DETECTED NOT DETECTED Final   Parainfluenza Virus 1 NOT DETECTED NOT DETECTED Final   Parainfluenza Virus 2 NOT DETECTED NOT DETECTED Final   Parainfluenza Virus 3 NOT DETECTED NOT DETECTED Final   Parainfluenza Virus 4 NOT DETECTED NOT DETECTED Final   Respiratory Syncytial Virus NOT DETECTED NOT DETECTED Final   Bordetella pertussis NOT DETECTED NOT DETECTED Final   Chlamydophila pneumoniae NOT DETECTED NOT DETECTED Final   Mycoplasma pneumoniae NOT DETECTED NOT DETECTED Final    Comment: Performed at [facility]: Basic Metabolic Panel:  Recent Labs Lab 07/22/16 2158 07/23/16 0232 07/24/16 0409 07/25/16 0431  NA 138 137 136 135   K 3.5 4.0 4.2 4.1  CL 102 100* 101 98*  CO2 26 28 29 30   GLUCOSE 118* 182* 211* 240*  BUN 20 22* 25* 16  CREATININE 1.05* 1.33* 1.21* 0.95  CALCIUM 9.1 8.5* 8.6* 9.1   Liver Function Tests:  Recent Labs Lab 07/24/16 0409  AST 36  ALT 30  ALKPHOS 88  BILITOT 0.5  PROT 6.4*  ALBUMIN 3.3*   No results for input(s): LIPASE, AMYLASE in the last 168 hours. No results for input(s): AMMONIA in the last 168 hours. CBC:  Recent Labs Lab 07/22/16 2158 07/23/16 0232 07/24/16 0409  WBC 13.6* 15.3* 9.8  NEUTROABS 12.0*  --   --   HGB 12.8 11.8* 12.5  HCT 38.5 36.3 38.8  MCV 81.9 83.3 83.4  PLT 244 265 291   Cardiac Enzymes: No results for input(s): CKTOTAL, CKMB, CKMBINDEX, TROPONINI in the last 168 hours. BNP: BNP (last 3 results) No results for input(s): BNP in the last 8760 hours.  ProBNP (last 3 results) No results for input(s): PROBNP in the last 8760 hours.  CBG:  Recent Labs Lab 07/24/16 0826 07/24/16 1236 07/24/16 1645 07/24/16 2200 07/25/16 0748  GLUCAP 215* 198* 192* 207* 246*       Signed:  Nita Sells MD   [facility] 07/25/2016, 11:50 AM

## 2016-07-25 NOTE — Progress Notes (Signed)
Discharge instructions reviewed with pt and husband. Antibiotic sheet emphasized. Follow up appt and RX reviewed. Verbalized understanding.

## 2016-07-28 LAB — CULTURE, BLOOD (SINGLE): Culture: NO GROWTH

## 2016-09-04 ENCOUNTER — Emergency Department (HOSPITAL_COMMUNITY)
Admission: EM | Admit: 2016-09-04 | Discharge: 2016-09-04 | Disposition: A | Discharge status: Home / Self Care | Payer: Medicare Other | Attending: Emergency Medicine | Admitting: Emergency Medicine

## 2016-09-04 ENCOUNTER — Encounter (HOSPITAL_COMMUNITY): Payer: Self-pay

## 2016-09-04 DIAGNOSIS — R300 Dysuria: Secondary | ICD-10-CM | POA: Diagnosis present

## 2016-09-04 DIAGNOSIS — Z5321 Procedure and treatment not carried out due to patient leaving prior to being seen by health care provider: Secondary | ICD-10-CM | POA: Diagnosis not present

## 2016-09-04 NOTE — ED Triage Notes (Signed)
PT C/O BURNING WITH URINATION X2 WEEKS. PT STS SHE WAS HOSPITALIZED 2 WEEKS AGO AND TREATED FOR A UTI, BUT WHEN SHE WAS DISCHARGED, SHE NEVER GOT A PRESCRIPTION TO CONTINUE ANY ABX. DENIES BLOOD IN THE URINE.

## 2016-12-25 ENCOUNTER — Ambulatory Visit (INDEPENDENT_AMBULATORY_CARE_PROVIDER_SITE_OTHER): Payer: Medicare Other | Admitting: Ophthalmology

## 2016-12-30 ENCOUNTER — Encounter: Payer: Self-pay | Admitting: Interventional Cardiology

## 2017-01-16 ENCOUNTER — Ambulatory Visit (INDEPENDENT_AMBULATORY_CARE_PROVIDER_SITE_OTHER): Payer: Medicare Other | Admitting: Interventional Cardiology

## 2017-01-16 ENCOUNTER — Encounter: Payer: Self-pay | Admitting: Interventional Cardiology

## 2017-01-16 VITALS — BP 118/82 | HR 72 | Ht 59.0 in | Wt 180.0 lb

## 2017-01-16 DIAGNOSIS — I482 Chronic atrial fibrillation: Secondary | ICD-10-CM | POA: Diagnosis not present

## 2017-01-16 DIAGNOSIS — E785 Hyperlipidemia, unspecified: Secondary | ICD-10-CM

## 2017-01-16 DIAGNOSIS — I10 Essential (primary) hypertension: Secondary | ICD-10-CM

## 2017-01-16 DIAGNOSIS — I350 Nonrheumatic aortic (valve) stenosis: Secondary | ICD-10-CM | POA: Diagnosis not present

## 2017-01-16 DIAGNOSIS — E1140 Type 2 diabetes mellitus with diabetic neuropathy, unspecified: Secondary | ICD-10-CM | POA: Diagnosis not present

## 2017-01-16 DIAGNOSIS — I4821 Permanent atrial fibrillation: Secondary | ICD-10-CM

## 2017-01-16 DIAGNOSIS — I2510 Atherosclerotic heart disease of native coronary artery without angina pectoris: Secondary | ICD-10-CM

## 2017-01-16 DIAGNOSIS — Z794 Long term (current) use of insulin: Secondary | ICD-10-CM

## 2017-01-16 MED ORDER — ATORVASTATIN CALCIUM 10 MG PO TABS: 10.0000 mg | ORAL_TABLET | Freq: Every day | ORAL | 3 refills | Status: DC

## 2017-01-16 NOTE — Progress Notes (Addendum)
Patient ID: Cassidy [name], female   DOB: June 04, 1927, 83 y.o.   MRN: [id number]     Cardiology Office Note   Date:  01/16/2017   ID:  Cassidy [name], DOB 1927/02/09, MRN [id number]  PCP:  [name], MD    No chief complaint on file.  F/u aortic stenosis  Wt Readings from Last 3 Encounters:  01/16/17 180 lb (81.6 kg)  09/04/16 185 lb (83.9 kg)  07/23/16 185 lb 6.5 oz (84.1 kg)       History of Present Illness: Cassidy [name] is a 83 y.o. female  who has had atrial fibrillation, coronary artery disease and aortic stenosis. She has not been seen for several years. She had a stent placed to her right coronary artery in 2001. She now uses a walker to get around. She denies any angina. She does not do a lot of dedicated exercise.  She has had atrial fibrillation for many years. She declined anticoagulation in the past. She continues to prefer not to use any anticoagulation. She has rare palpitations, perhaps oce a months for a few seconds.    She has had mild aortic stenosis last visualized by echocardiogram in 2014. She denies any chest discomfort, dyspnea on exertion or syncope.   She has not had leg swelling.  She feels some lightheadedness only when she gets very stressed out.  THis can happen if her blood sugar drops.   She uses a walker. She has fallen three times in the past year.  She can feel weak if she gets too hungry and then fallen.  She has not hurt herself.     Past Medical History:  Diagnosis Date  . Aortic stenosis    mild, echo, February, 2009  . Ascending aorta dilatation (HCC)    mild, echo, February, 2009  . Asthma   . Atrial fibrillation (HCC)    Coumadin is not used per the patient because of her eyes  . Blindness of left eye    related to some type of stroke in the past  . CAD (coronary artery disease)    stents right coronary artery 2001  /  nuclear, March, 2009, no ischemia  . Colon polyp   . Diverticulosis   . DM (diabetes mellitus)  ([place])   . Dyslipidemia   . Ejection fraction    EF 70%, nuclear, 2009, normal, echo, 2009  . Falling episodes   . HTN (hypertension)   . Kidney stones   . Obesity   . PUD (peptic ulcer disease)   . UTI (urinary tract infection)   . Ventral hernia     Past Surgical History:  Procedure Laterality Date  . APPENDECTOMY    . ESOPHAGOGASTRODUODENOSCOPY N/A 11/07/2013   Procedure: ESOPHAGOGASTRODUODENOSCOPY (EGD);  Surgeon: Lafayette Dragon, MD;  Location: [facility];  Service: Endoscopy;  Laterality: N/A;  . TONSILLECTOMY    . TOTAL ABDOMINAL HYSTERECTOMY       Current Outpatient Prescriptions  Medication Sig Dispense Refill  . acetaminophen (TYLENOL) 650 MG CR tablet Take 650 mg by mouth every 8 (eight) hours as needed for pain.    Marland Kitchen ALPRAZolam (XANAX) 1 MG tablet Take 1 mg by mouth at bedtime.     Marland Kitchen amitriptyline (ELAVIL) 10 MG tablet Take 10 mg by mouth at bedtime.    . calcium carbonate (OS-CAL - DOSED IN MG OF ELEMENTAL CALCIUM) 1250 (500 Ca) MG tablet Take 1 tablet by mouth daily with breakfast.    .  donepezil (ARICEPT) 10 MG tablet Take 10 mg by mouth at bedtime.     . insulin NPH-insulin regular (NOVOLIN 70/30) (70-30) 100 UNIT/ML injection Inject 15-30 Units into the skin 2 (two) times daily with a meal. 30 units in the morning and 15 units at night    . losartan-hydrochlorothiazide (HYZAAR) 100-12.5 MG tablet Take 1 tablet by mouth daily with breakfast.    . metFORMIN (GLUCOPHAGE) 500 MG tablet Take 500 mg by mouth 2 (two) times daily with a meal.    . nystatin cream (MYCOSTATIN) Apply 1 application topically 3 (three) times daily as needed (for yeast infection on vagina).     . oxybutynin (DITROPAN-XL) 10 MG 24 hr tablet Take 10 mg by mouth daily with breakfast.     . Probiotic Product (PROBIOTIC COLON SUPPORT) CAPS Take 1 capsule by mouth daily with breakfast.    . sorbitol 70 % SOLN Take 20 mLs by mouth daily as needed for moderate constipation. 473 mL 0   No current  facility-administered medications for this visit.     Allergies:   Latex    Social History:  The patient  reports that she has never smoked. She has never used smokeless tobacco. She reports that she does not drink alcohol or use drugs.   Family History:  The patient's family history includes Colon cancer in her mother; Diabetes in her daughter and sister; Heart attack in her father.    ROS:  Please see the history of present illness.   Otherwise, review of systems are positive for multiple falls even with walker.   All other systems are reviewed and negative.    PHYSICAL EXAM: VS:  BP 118/82   Pulse 72   Ht 4\' 11"  (1.499 m)   Wt 180 lb (81.6 kg)   BMI 36.36 kg/m  , BMI Body mass index is 36.36 kg/m. GEN: Well nourished, well developed, in no acute distress  HEENT: normal  Neck: no JVD, carotid bruits, or masses Cardiac: irregularly irregular; 2/6 systolic murmur, no rubs, or gallops,no edema  Respiratory:  clear to auscultation bilaterally, normal work of breathing GI: soft, nontender, nondistended, + BS MS: no deformity or atrophy  Skin: warm and dry, no rash Neuro:  Strength and sensation are intact Psych: euthymic mood, full affect   EKG:   The ekg ordered today demonstrates atrial fibrillation, nonspecific ST segment changes   Recent Labs: 07/24/2016: ALT 30; Hemoglobin 12.5; Platelets 291 07/25/2016: BUN 16; Creatinine, Ser 0.95; Potassium 4.1; Sodium 135   Lipid Panel    Component Value Date/Time   CHOL 215 (H) 02/07/2016 0909   TRIG 132 02/07/2016 0909   HDL 62 02/07/2016 0909   CHOLHDL 3.5 02/07/2016 0909   VLDL 26 02/07/2016 0909   LDLCALC 127 02/07/2016 0909     Other studies Reviewed: Additional studies/ records that were reviewed today with results demonstrating: 2014 echocardiogram.  Mild aortic stenosis, normal LV function, mild aortic insufficiency   ASSESSMENT AND PLAN:  1. CAD: Continue aggressive secondary prevention. No angina on current  medical therapy.  LDL target < 100.  See below. She states she has never taken a cholesterol medicine. 2. Atrial fibrillation: Rate controlled. We have discussed anticoagulation. Based on her elevated chads score, she would benefit from anticoagulation. She still declined anticoagulation.  She admits to multiple falls in the past year so it may be reasonable to avoid anticoagulation. 3. Aortic stenosis: By exam, it does not seem severe. She is not having symptoms  of severe aortic stenosis either. 4. Hyperlipidemia: Start atorvastatin 10 mg daily.  THis should lower her risk of CAD in the future. LDL target < 100 given CAD and DM.  Check labs including lipids when she is fasting. 5. DM: followed by PMD.   Current medicines are reviewed at length with the patient today.  The patient concerns regarding her medicines were addressed.  The following changes have been made:  No change  Labs/ tests ordered today include:  No orders of the defined types were placed in this encounter.   Recommend 150 minutes/week of aerobic exercise Low fat, low carb, high fiber diet recommended  Disposition:   FU in 1 year   Signed, Larae Grooms, MD  01/16/2017 2:08 PM    [facility] [place], [place], [place]  [address] Phone: [phone]; Fax: [phone]

## 2017-01-16 NOTE — Patient Instructions (Signed)
Medication Instructions:  Your physician has recommended you make the following change in your medication:  1. Start Atorvastatin 10 mg once a day.  Labwork: Your physician recommends that you return for lab work on 04/1717 for FASTING lipids and CMET   Testing/Procedures: None ordered.  Follow-Up: Your physician wants you to follow-up in: 1 year with Dr. Irish Lack. You will receive a reminder letter in the mail two months in advance. If you don't receive a letter, please call our office to schedule the follow-up appointment.   Any Other Special Instructions Will Be Listed Below (If Applicable).     If you need a refill on your cardiac medications before your next appointment, please call your pharmacy.

## 2017-01-22 ENCOUNTER — Ambulatory Visit (INDEPENDENT_AMBULATORY_CARE_PROVIDER_SITE_OTHER): Payer: Medicare Other | Admitting: Ophthalmology

## 2017-04-17 ENCOUNTER — Other Ambulatory Visit: Payer: Medicare Other

## 2017-07-28 ENCOUNTER — Emergency Department (HOSPITAL_COMMUNITY)
Admission: EM | Admit: 2017-07-28 | Discharge: 2017-07-29 | Disposition: A | Discharge status: Home / Self Care | Payer: Medicare Other | Attending: Emergency Medicine | Admitting: Emergency Medicine

## 2017-07-28 ENCOUNTER — Other Ambulatory Visit: Payer: Self-pay

## 2017-07-28 ENCOUNTER — Emergency Department (HOSPITAL_COMMUNITY): Payer: Medicare Other

## 2017-07-28 ENCOUNTER — Encounter (HOSPITAL_COMMUNITY): Payer: Self-pay

## 2017-07-28 DIAGNOSIS — I10 Essential (primary) hypertension: Secondary | ICD-10-CM | POA: Diagnosis not present

## 2017-07-28 DIAGNOSIS — I2510 Atherosclerotic heart disease of native coronary artery without angina pectoris: Secondary | ICD-10-CM | POA: Diagnosis not present

## 2017-07-28 DIAGNOSIS — N390 Urinary tract infection, site not specified: Secondary | ICD-10-CM | POA: Diagnosis not present

## 2017-07-28 DIAGNOSIS — Z794 Long term (current) use of insulin: Secondary | ICD-10-CM | POA: Diagnosis not present

## 2017-07-28 DIAGNOSIS — R05 Cough: Secondary | ICD-10-CM | POA: Diagnosis present

## 2017-07-28 DIAGNOSIS — E119 Type 2 diabetes mellitus without complications: Secondary | ICD-10-CM | POA: Insufficient documentation

## 2017-07-28 DIAGNOSIS — Z79899 Other long term (current) drug therapy: Secondary | ICD-10-CM | POA: Diagnosis not present

## 2017-07-28 DIAGNOSIS — J209 Acute bronchitis, unspecified: Secondary | ICD-10-CM

## 2017-07-28 DIAGNOSIS — J45909 Unspecified asthma, uncomplicated: Secondary | ICD-10-CM | POA: Diagnosis not present

## 2017-07-28 LAB — CBC
HCT: 37.9 % (ref 36.0–46.0)
Hemoglobin: 12.4 g/dL (ref 12.0–15.0)
MCH: 27 pg (ref 26.0–34.0)
MCHC: 32.7 g/dL (ref 30.0–36.0)
MCV: 82.6 fL (ref 78.0–100.0)
PLATELETS: 322 10*3/uL (ref 150–400)
RBC: 4.59 MIL/uL (ref 3.87–5.11)
RDW: 16.9 % — AB (ref 11.5–15.5)
WBC: 8.8 10*3/uL (ref 4.0–10.5)

## 2017-07-28 LAB — BASIC METABOLIC PANEL
Anion gap: 9 (ref 5–15)
BUN: 27 mg/dL — ABNORMAL HIGH (ref 6–20)
CALCIUM: 9.8 mg/dL (ref 8.9–10.3)
CO2: 30 mmol/L (ref 22–32)
CREATININE: 1.82 mg/dL — AB (ref 0.44–1.00)
Chloride: 97 mmol/L — ABNORMAL LOW (ref 101–111)
GFR calc non Af Amer: 24 mL/min — ABNORMAL LOW (ref >60)
GFR, EST AFRICAN AMERICAN: 27 mL/min — AB (ref >60)
Glucose, Bld: 137 mg/dL — ABNORMAL HIGH (ref 65–99)
Potassium: 4.9 mmol/L (ref 3.5–5.1)
SODIUM: 136 mmol/L (ref 135–145)

## 2017-07-28 MED ORDER — IPRATROPIUM-ALBUTEROL 0.5-2.5 (3) MG/3ML IN SOLN
3.0000 mL | Freq: Once | RESPIRATORY_TRACT | Status: AC
  Administered 2017-07-28: 3 mL via RESPIRATORY_TRACT
  Filled 2017-07-28: qty 3

## 2017-07-28 MED ORDER — PREDNISONE 20 MG PO TABS: 20.0000 mg | ORAL_TABLET | Freq: Two times a day (BID) | ORAL | 0 refills | Status: DC

## 2017-07-28 MED ORDER — PREDNISONE 20 MG PO TABS
60.0000 mg | ORAL_TABLET | Freq: Once | ORAL | Status: AC
  Administered 2017-07-28: 60 mg via ORAL
  Filled 2017-07-28: qty 3

## 2017-07-28 MED ORDER — ALBUTEROL SULFATE HFA 108 (90 BASE) MCG/ACT IN AERS
2.0000 | INHALATION_SPRAY | Freq: Once | RESPIRATORY_TRACT | Status: AC
  Administered 2017-07-29: 2 via RESPIRATORY_TRACT
  Filled 2017-07-28: qty 6.7

## 2017-07-28 MED ORDER — ALBUTEROL SULFATE (2.5 MG/3ML) 0.083% IN NEBU: 5.0000 mg | INHALATION_SOLUTION | Freq: Once | RESPIRATORY_TRACT | Status: DC

## 2017-07-28 MED ORDER — AEROCHAMBER Z-STAT PLUS/MEDIUM MISC
1.0000 | Freq: Once | Status: AC
  Administered 2017-07-29: 1
  Filled 2017-07-28: qty 1

## 2017-07-28 NOTE — ED Triage Notes (Signed)
Patient c/o non productive cough x 4 days that has gotten progressively worse. Patient went to PCP today for worsening cough and was told to come to the ED for a chest xray.

## 2017-07-28 NOTE — ED Notes (Addendum)
EKG delayed in acute area.  Pt transferred into room by pt was having xray.

## 2017-07-28 NOTE — ED Provider Notes (Signed)
[facility] DEPT Provider Note   CSN: [id number] Arrival date & time: 07/28/17  1830     History   Chief Complaint Chief Complaint  Patient presents with  . Cough  . Weakness    HPI Cassidy [name] is a 83 y.o. female.  She presents for evaluation of cough, nonproductive, with weakness, which started during the night yesterday.  She denies fever, chills, nausea, vomiting, paresthesia or focal weakness.  She denies chest pain, back pain abdominal pain or leg pain.  She is taking her usual medications, without relief.  She saw her PCP, last week and was started on a antibiotic for urinary tract infection, unknown type.  She went to an urgent care this evening, her O2 sat was found to be 91% on room air, and she was directed to come here for a "chest x-ray."  There are no other known modifying factors.  HPI  Past Medical History:  Diagnosis Date  . Aortic stenosis    mild, echo, February, 2009  . Ascending aorta dilatation (HCC)    mild, echo, February, 2009  . Asthma   . Atrial fibrillation (HCC)    Coumadin is not used per the patient because of her eyes  . Blindness of left eye    related to some type of stroke in the past  . CAD (coronary artery disease)    stents right coronary artery 2001  /  nuclear, March, 2009, no ischemia  . Colon polyp   . Diverticulosis   . DM (diabetes mellitus) ([place])   . Dyslipidemia   . Ejection fraction    EF 70%, nuclear, 2009, normal, echo, 2009  . Falling episodes   . HTN (hypertension)   . Kidney stones   . Obesity   . PUD (peptic ulcer disease)   . UTI (urinary tract infection)   . Ventral hernia     Patient Active Problem List   Diagnosis Date Noted  . Sepsis ([place]) 07/22/2016  . UTI (urinary tract infection) 07/22/2016  . Duodenal ulcer 11/07/2013  . Gastric ulcer with hemorrhage 11/07/2013  . Upper GI bleed 11/06/2013  . Unspecified constipation 09/19/2013  . Family history of malignant  neoplasm of gastrointestinal tract 09/19/2013  . Preop cardiovascular exam 09/11/2011  . CAD (coronary artery disease)   . Permanent atrial fibrillation ([place])   . Essential hypertension   . Diverticulosis   . Colon polyp   . Aortic stenosis   . Ascending aorta dilatation (HCC)   . Asthma   . Obesity   . Dyslipidemia   . Ejection fraction   . Type 2 diabetes mellitus with diabetic neuropathy, with long-term current use of insulin ([place])   . Falling episodes     Past Surgical History:  Procedure Laterality Date  . APPENDECTOMY    . ESOPHAGOGASTRODUODENOSCOPY N/A 11/07/2013   Procedure: ESOPHAGOGASTRODUODENOSCOPY (EGD);  Surgeon: Lafayette Dragon, MD;  Location: [facility];  Service: Endoscopy;  Laterality: N/A;  . TONSILLECTOMY    . TOTAL ABDOMINAL HYSTERECTOMY      OB History    No data available       Home Medications    Prior to Admission medications   Medication Sig Start Date End Date Taking? Authorizing Provider  acetaminophen (TYLENOL) 650 MG CR tablet Take 650 mg by mouth every 8 (eight) hours as needed for pain.   Yes [provider]  ALPRAZolam Duanne Moron) 1 MG tablet Take 1 mg by mouth at bedtime.  Yes [provider]  amitriptyline (ELAVIL) 10 MG tablet Take 10 mg by mouth at bedtime.   Yes [provider]  atorvastatin (LIPITOR) 10 MG tablet Take 10 mg by mouth every other day.  07/28/17  Yes [provider]  calcium carbonate (OS-CAL - DOSED IN MG OF ELEMENTAL CALCIUM) 1250 (500 Ca) MG tablet Take 1 tablet by mouth daily with breakfast.   Yes [provider]  donepezil (ARICEPT) 10 MG tablet Take 10 mg by mouth at bedtime.    Yes [provider]  insulin NPH-insulin regular (NOVOLIN 70/30) (70-30) 100 UNIT/ML injection Inject 15-30 Units into the skin 2 (two) times daily with a meal. 30 units in the morning and 15 units at night   Yes [provider]  losartan-hydrochlorothiazide (HYZAAR) 100-12.5 MG  tablet Take 1 tablet by mouth daily with breakfast. 06/25/16  Yes [provider]  metFORMIN (GLUCOPHAGE) 500 MG tablet Take 500 mg by mouth 2 (two) times daily with a meal.   Yes [provider]  nystatin cream (MYCOSTATIN) Apply 1 application topically 3 (three) times daily as needed (for yeast infection on vagina).  06/14/16  Yes [provider]  oxybutynin (DITROPAN-XL) 10 MG 24 hr tablet Take 10 mg by mouth daily with breakfast.  12/29/15  Yes [provider]  polyethylene glycol powder (GLYCOLAX/MIRALAX) powder Take 17 g by mouth daily. 05/21/17  Yes [provider]  Probiotic Product (PROBIOTIC COLON SUPPORT) CAPS Take 1 capsule by mouth daily with breakfast.   Yes [provider]  psyllium (METAMUCIL) 58.6 % powder Take 1 packet by mouth daily.   Yes [provider]  sorbitol 70 % SOLN Take 20 mLs by mouth daily as needed for moderate constipation. 07/25/16  Yes Nita Sells, MD  atorvastatin (LIPITOR) 10 MG tablet Take 1 tablet (10 mg total) by mouth daily. 01/16/17 04/16/17  Jettie Booze, MD  predniSONE (DELTASONE) 20 MG tablet Take 1 tablet (20 mg total) by mouth 2 (two) times daily. 07/28/17   Daleen Bo, MD    Family History Family History  Problem Relation Age of Onset  . Colon cancer Mother   . Heart attack Father        39  . Diabetes Sister   . Diabetes Daughter        x 2  . Hypertension Neg Hx   . Stroke Neg Hx     Social History Social History   Tobacco Use  . Smoking status: Never Smoker  . Smokeless tobacco: Never Used  Substance Use Topics  . Alcohol use: No  . Drug use: No     Allergies   Latex   Review of Systems Review of Systems  All other systems reviewed and are negative.    Physical Exam Updated Vital Signs BP (!) 116/54 (BP Location: Left Arm)   Pulse 62   Temp 98.2 F (36.8 C) (Oral)   Resp 18   Ht 4\' 11"  (1.499 m)   Wt 74 kg (163 lb 4 oz)   SpO2 97%    BMI 32.97 kg/m   Physical Exam  Constitutional: She is oriented to person, place, and time. She appears well-developed.  Elderly, frail  HENT:  Head: Normocephalic and atraumatic.  Eyes: Conjunctivae and EOM are normal. Pupils are equal, round, and reactive to light.  Neck: Normal range of motion and phonation normal. Neck supple.  Cardiovascular: Normal rate and regular rhythm.  Pulmonary/Chest: Effort normal. No stridor. No respiratory  distress. She exhibits no tenderness.  Decreased air movement, bilaterally.  With scattered wheezing.  Abdominal: Soft. She exhibits no distension. There is no tenderness. There is no guarding.  Musculoskeletal: Normal range of motion. She exhibits edema (2+ legs bilaterally).  Neurological: She is alert and oriented to person, place, and time. She exhibits normal muscle tone.  Skin: Skin is warm and dry.  Psychiatric: She has a normal mood and affect. Her behavior is normal. Judgment and thought content normal.  Nursing note and vitals reviewed.    ED Treatments / Results  Labs (all labs ordered are listed, but only abnormal results are displayed) Labs Reviewed  BASIC METABOLIC PANEL - Abnormal; Notable for the following components:      Result Value   Chloride 97 (*)    Glucose, Bld 137 (*)    BUN 27 (*)    Creatinine, Ser 1.82 (*)    GFR calc non Af Amer 24 (*)    GFR calc Af Amer 27 (*)    All other components within normal limits  CBC - Abnormal; Notable for the following components:   RDW 16.9 (*)    All other components within normal limits  URINE CULTURE  CBG MONITORING, ED    EKG  EKG Interpretation  Date/Time:  Tuesday July 28 2017 19:19:35 EST Ventricular Rate:  73 PR Interval:    QRS Duration: 81 QT Interval:  381 QTC Calculation: 420 R Axis:   53 Text Interpretation:  Atrial flutter with predominant 4:1 AV block Low voltage, precordial leads Nonspecific T abnormalities, lateral leads Baseline wander in lead(s) II  III aVF since last tracing no significant change Confirmed by Daleen Bo [id number]) on 07/28/2017 8:24:46 PM       Radiology Dg Chest 2 View  Result Date: 07/28/2017 CLINICAL DATA:  Nonproductive cough for the past 4 days. Shortness of breath. EXAM: CHEST  2 VIEW COMPARISON:  Chest x-ray dated July 22, 2016. FINDINGS: Stable enlarged cardiomediastinal silhouette. Normal pulmonary vascularity. Atherosclerotic calcification of the aortic arch. Bibasilar atelectasis. No consolidation, pleural effusion, or pneumothorax. No acute osseous abnormality. IMPRESSION: Stable cardiomegaly.  No active cardiopulmonary disease. Electronically Signed   By: Titus Dubin M.D.   On: 07/28/2017 20:28    Procedures Procedures (including critical care time)  Medications Ordered in ED Medications  albuterol (PROVENTIL) (2.5 MG/3ML) 0.083% nebulizer solution 5 mg (5 mg Nebulization Not Given 07/28/17 2110)  albuterol (PROVENTIL HFA;VENTOLIN HFA) 108 (90 Base) MCG/ACT inhaler 2 puff (not administered)  aerochamber Z-Stat Plus/medium 1 each (not administered)  ipratropium-albuterol (DUONEB) 0.5-2.5 (3) MG/3ML nebulizer solution 3 mL (3 mLs Nebulization Given 07/28/17 2104)  predniSONE (DELTASONE) tablet 60 mg (60 mg Oral Given 07/28/17 2104)     Initial Impression / Assessment and Plan / ED Course  I have reviewed the triage vital signs and the nursing notes.  Pertinent labs & imaging results that were available during my care of the patient were reviewed by me and considered in my medical decision making (see chart for details).      Patient Vitals for the past 24 hrs:  BP Temp Temp src Pulse Resp SpO2 Height Weight  07/28/17 2307 (!) 116/54 - - 62 18 97 % - -  07/28/17 2107 (!) 129/58 - - 67 18 96 % - -  07/28/17 1906 - - - - - - 4\' 11"  (1.499 m) 74 kg (163 lb 4 oz)  07/28/17 1901 (!) 160/46 98.2 F (36.8 C) Oral 76 16 92 % - -  8:41 PM Reevaluation with update and discussion. After  initial assessment and treatment, an updated evaluation reveals she is resting comfortably, lying supine.  On room air oxygen saturation 91%, it rapidly rises to 95% with a few deep breaths.  Patient is comfortable and states that she wants to go home.  She has drank several glasses of water, here.  Findings discussed with the patient and her daughter all questions were answered. Daleen Bo      Final Clinical Impressions(s) / ED Diagnoses   Final diagnoses:  Bronchitis, acute, with bronchospasm  Urinary tract infection without hematuria, site unspecified    Evaluation consistent with bronchitis, patient improved with treatment initiated in the ED.  She is currently taking Septra, for UTI culture positive for E. coli.  She started this several days ago, but was delayed because of the holiday.  Doubt pyelonephritis, pneumonia, metabolic instability or impending vascular collapse.  Patient with mild creatinine elevation consistent with dehydration, but is drinking fluids well, in the ED.  Nursing Notes Reviewed/ Care Coordinated Applicable Imaging Reviewed Interpretation of Laboratory Data incorporated into ED treatment  The patient appears reasonably screened and/or stabilized for discharge and I doubt any other medical condition or other [facility] requiring further screening, evaluation, or treatment in the ED at this time prior to discharge.  Plan: Home Medications-continue current medications, albuterol inhaler with spacer to go; Home Treatments-rest, increase oral fluids; return here if the recommended treatment, does not improve the symptoms; Recommended follow up-PCP checkup in 3-5 days.   ED Discharge Orders        Ordered    predniSONE (DELTASONE) 20 MG tablet  2 times daily     07/28/17 2342       Daleen Bo, MD 07/30/17 [phone]

## 2017-07-28 NOTE — Discharge Instructions (Signed)
Use the albuterol inhaler 2 puffs every 3 or 4 hours as needed for cough or trouble breathing.  Continue taking the antibiotic prescribed for urinary tract infection.  It should also help your bronchitis.  Try to drink 6-8 glasses of water each day to improve your hydration status.

## 2017-07-29 DIAGNOSIS — J209 Acute bronchitis, unspecified: Secondary | ICD-10-CM | POA: Diagnosis not present

## 2017-07-30 LAB — URINE CULTURE: CULTURE: NO GROWTH

## 2017-08-10 ENCOUNTER — Emergency Department (HOSPITAL_COMMUNITY)
Admission: EM | Admit: 2017-08-10 | Discharge: 2017-08-10 | Disposition: A | Discharge status: Home / Self Care | Payer: Medicare Other | Attending: Emergency Medicine | Admitting: Emergency Medicine

## 2017-08-10 ENCOUNTER — Emergency Department (HOSPITAL_COMMUNITY): Payer: Medicare Other

## 2017-08-10 ENCOUNTER — Encounter (HOSPITAL_COMMUNITY): Payer: Self-pay

## 2017-08-10 ENCOUNTER — Other Ambulatory Visit: Payer: Self-pay

## 2017-08-10 DIAGNOSIS — E785 Hyperlipidemia, unspecified: Secondary | ICD-10-CM | POA: Insufficient documentation

## 2017-08-10 DIAGNOSIS — Z79899 Other long term (current) drug therapy: Secondary | ICD-10-CM | POA: Diagnosis not present

## 2017-08-10 DIAGNOSIS — E119 Type 2 diabetes mellitus without complications: Secondary | ICD-10-CM | POA: Insufficient documentation

## 2017-08-10 DIAGNOSIS — J45909 Unspecified asthma, uncomplicated: Secondary | ICD-10-CM | POA: Diagnosis not present

## 2017-08-10 DIAGNOSIS — R011 Cardiac murmur, unspecified: Secondary | ICD-10-CM | POA: Insufficient documentation

## 2017-08-10 DIAGNOSIS — Z794 Long term (current) use of insulin: Secondary | ICD-10-CM | POA: Diagnosis not present

## 2017-08-10 DIAGNOSIS — I4891 Unspecified atrial fibrillation: Secondary | ICD-10-CM | POA: Insufficient documentation

## 2017-08-10 DIAGNOSIS — I2510 Atherosclerotic heart disease of native coronary artery without angina pectoris: Secondary | ICD-10-CM | POA: Insufficient documentation

## 2017-08-10 DIAGNOSIS — I10 Essential (primary) hypertension: Secondary | ICD-10-CM | POA: Diagnosis not present

## 2017-08-10 DIAGNOSIS — Z91040 Latex allergy status: Secondary | ICD-10-CM | POA: Insufficient documentation

## 2017-08-10 DIAGNOSIS — R531 Weakness: Secondary | ICD-10-CM | POA: Insufficient documentation

## 2017-08-10 LAB — CBC WITH DIFFERENTIAL/PLATELET
BASOS ABS: 0 10*3/uL (ref 0.0–0.1)
BASOS PCT: 0 %
EOS ABS: 0.3 10*3/uL (ref 0.0–0.7)
Eosinophils Relative: 3 %
HEMATOCRIT: 39.7 % (ref 36.0–46.0)
HEMOGLOBIN: 12.7 g/dL (ref 12.0–15.0)
Lymphocytes Relative: 23 %
Lymphs Abs: 2.5 10*3/uL (ref 0.7–4.0)
MCH: 26.6 pg (ref 26.0–34.0)
MCHC: 32 g/dL (ref 30.0–36.0)
MCV: 83.2 fL (ref 78.0–100.0)
Monocytes Absolute: 1.2 10*3/uL — ABNORMAL HIGH (ref 0.1–1.0)
Monocytes Relative: 10 %
NEUTROS ABS: 7.2 10*3/uL (ref 1.7–7.7)
NEUTROS PCT: 64 %
Platelets: 349 10*3/uL (ref 150–400)
RBC: 4.77 MIL/uL (ref 3.87–5.11)
RDW: 18 % — ABNORMAL HIGH (ref 11.5–15.5)
WBC: 11.2 10*3/uL — ABNORMAL HIGH (ref 4.0–10.5)

## 2017-08-10 LAB — LIPASE, BLOOD: LIPASE: 28 U/L (ref 11–51)

## 2017-08-10 LAB — COMPREHENSIVE METABOLIC PANEL
ALT: 14 U/L (ref 14–54)
ANION GAP: 6 (ref 5–15)
AST: 18 U/L (ref 15–41)
Albumin: 3.2 g/dL — ABNORMAL LOW (ref 3.5–5.0)
Alkaline Phosphatase: 68 U/L (ref 38–126)
BILIRUBIN TOTAL: 0.6 mg/dL (ref 0.3–1.2)
BUN: 23 mg/dL — ABNORMAL HIGH (ref 6–20)
CALCIUM: 8.9 mg/dL (ref 8.9–10.3)
CO2: 32 mmol/L (ref 22–32)
Chloride: 100 mmol/L — ABNORMAL LOW (ref 101–111)
Creatinine, Ser: 0.9 mg/dL (ref 0.44–1.00)
GFR, EST NON AFRICAN AMERICAN: 55 mL/min — AB (ref >60)
Glucose, Bld: 87 mg/dL (ref 65–99)
POTASSIUM: 4.5 mmol/L (ref 3.5–5.1)
Sodium: 138 mmol/L (ref 135–145)
TOTAL PROTEIN: 6.3 g/dL — AB (ref 6.5–8.1)

## 2017-08-10 LAB — CBG MONITORING, ED: GLUCOSE-CAPILLARY: 75 mg/dL (ref 65–99)

## 2017-08-10 LAB — TROPONIN I: TROPONIN I: 0.03 ng/mL — AB (ref <0.03)

## 2017-08-10 LAB — BRAIN NATRIURETIC PEPTIDE: B Natriuretic Peptide: 108.4 pg/mL — ABNORMAL HIGH (ref 0.0–100.0)

## 2017-08-10 MED ORDER — SODIUM CHLORIDE 0.9 % IV SOLN
INTRAVENOUS | Status: DC
  Administered 2017-08-10: 09:00:00 via INTRAVENOUS

## 2017-08-10 NOTE — ED Notes (Signed)
ED Provider at bedside. 

## 2017-08-10 NOTE — ED Notes (Signed)
Pt eating breakfast tray 

## 2017-08-10 NOTE — ED Notes (Signed)
Bed: WA19 Expected date:  Expected time:  Means of arrival:  Comments: 

## 2017-08-10 NOTE — ED Triage Notes (Signed)
She arrived here per EMS [facility]) with her husband, who is currently a patient in our room #18. She c/o "I'm diabetic and I'm weak and sleepy and I'm hungry". She is drowsy and in no distress.

## 2017-08-10 NOTE — ED Notes (Signed)
Patient needs urine sample, used BSC and urine sample was contaminated by feces.

## 2017-08-10 NOTE — Discharge Instructions (Signed)
As discussed, although you have elected to follow-up with your physician, it is very important that you monitor your condition carefully, and do not hesitate to return here for concerning changes in your condition. Otherwise, please be sure to follow-up with your physician in the coming days, and discuss all medication, and consideration for physical therapy, rehabilitation as well.

## 2017-08-10 NOTE — ED Notes (Signed)
Patient states "I'm ready to go home, I want to go home right now." This RN explained to patient the importance of her staying to wait for results and further care. Pt attempting to call friend to take her home.

## 2017-08-10 NOTE — ED Provider Notes (Signed)
[facility] DEPT Provider Note   CSN: [id number] Arrival date & time: 08/10/17  0757     History   Chief Complaint Chief Complaint  Patient presents with  . Weakness    HPI Cassidy [name] is a 83 y.o. female.  HPI Patient presents due to feeling sickly. She is actually here with her husband who arrived via EMS, notes that though he is being evaluated she also feels poorly, wants to be checked out. She notes that over the past 2 weeks she has felt persistently weak, without new pain, without syncope, without fever. She has been taking therapy for urinary tract infection, otherwise no new medication changes. No confusion, disorientation, vomiting, diarrhea. She states that she just feels poorly. No clear precipitant prior to the onset of illness. Patient lives at home, is ambulatory, independent.   Past Medical History:  Diagnosis Date  . Aortic stenosis    mild, echo, February, 2009  . Ascending aorta dilatation (HCC)    mild, echo, February, 2009  . Asthma   . Atrial fibrillation (HCC)    Coumadin is not used per the patient because of her eyes  . Blindness of left eye    related to some type of stroke in the past  . CAD (coronary artery disease)    stents right coronary artery 2001  /  nuclear, March, 2009, no ischemia  . Colon polyp   . Diverticulosis   . DM (diabetes mellitus) ([place])   . Dyslipidemia   . Ejection fraction    EF 70%, nuclear, 2009, normal, echo, 2009  . Falling episodes   . HTN (hypertension)   . Kidney stones   . Obesity   . PUD (peptic ulcer disease)   . UTI (urinary tract infection)   . Ventral hernia     Patient Active Problem List   Diagnosis Date Noted  . Sepsis ([place]) 07/22/2016  . UTI (urinary tract infection) 07/22/2016  . Duodenal ulcer 11/07/2013  . Gastric ulcer with hemorrhage 11/07/2013  . Upper GI bleed 11/06/2013  . Unspecified constipation 09/19/2013  . Family history of malignant  neoplasm of gastrointestinal tract 09/19/2013  . Preop cardiovascular exam 09/11/2011  . CAD (coronary artery disease)   . Permanent atrial fibrillation ([place])   . Essential hypertension   . Diverticulosis   . Colon polyp   . Aortic stenosis   . Ascending aorta dilatation (HCC)   . Asthma   . Obesity   . Dyslipidemia   . Ejection fraction   . Type 2 diabetes mellitus with diabetic neuropathy, with long-term current use of insulin ([place])   . Falling episodes     Past Surgical History:  Procedure Laterality Date  . APPENDECTOMY    . ESOPHAGOGASTRODUODENOSCOPY N/A 11/07/2013   Procedure: ESOPHAGOGASTRODUODENOSCOPY (EGD);  Surgeon: Lafayette Dragon, MD;  Location: [facility];  Service: Endoscopy;  Laterality: N/A;  . TONSILLECTOMY    . TOTAL ABDOMINAL HYSTERECTOMY      OB History    No data available       Home Medications    Prior to Admission medications   Medication Sig Start Date End Date Taking? Authorizing Provider  acetaminophen (TYLENOL) 650 MG CR tablet Take 650 mg by mouth every 8 (eight) hours as needed for pain.   Yes [provider]  amitriptyline (ELAVIL) 10 MG tablet Take 10 mg by mouth at bedtime.   Yes [provider]  atorvastatin (LIPITOR) 10 MG tablet Take 10 mg  by mouth every other day.  07/28/17  Yes [provider]  calcium carbonate (OS-CAL - DOSED IN MG OF ELEMENTAL CALCIUM) 1250 (500 Ca) MG tablet Take 1 tablet by mouth daily with breakfast.   Yes [provider]  insulin NPH-insulin regular (NOVOLIN 70/30) (70-30) 100 UNIT/ML injection Inject 15-30 Units into the skin 2 (two) times daily with a meal. 30 units in the morning and 15 units at night   Yes [provider]  losartan-hydrochlorothiazide (HYZAAR) 100-12.5 MG tablet Take 1 tablet by mouth daily with breakfast. 06/25/16  Yes [provider]  metFORMIN (GLUCOPHAGE) 500 MG tablet Take 500 mg by mouth 2 (two) times daily with a meal.   Yes [provider]  oxybutynin (DITROPAN-XL) 10 MG 24 hr tablet Take 10 mg by mouth daily with breakfast.  12/29/15  Yes [provider]  polyethylene glycol powder (GLYCOLAX/MIRALAX) powder Take 17 g by mouth daily as needed for mild constipation.  05/21/17  Yes [provider]  Probiotic Product (PROBIOTIC COLON SUPPORT) CAPS Take 1 capsule by mouth daily with breakfast.   Yes [provider]  psyllium (METAMUCIL) 58.6 % powder Take 1 packet by mouth daily.   Yes [provider]  sorbitol 70 % SOLN Take 20 mLs by mouth daily as needed for moderate constipation. 07/25/16  Yes Nita Sells, MD  atorvastatin (LIPITOR) 10 MG tablet Take 1 tablet (10 mg total) by mouth daily. 01/16/17 04/16/17  Jettie Booze, MD  donepezil (ARICEPT) 10 MG tablet Take 10 mg by mouth at bedtime.     [provider]  nystatin cream (MYCOSTATIN) Apply 1 application topically 3 (three) times daily as needed (for yeast infection on vagina).  06/14/16   [provider]  predniSONE (DELTASONE) 20 MG tablet Take 1 tablet (20 mg total) by mouth 2 (two) times daily. Patient not taking: Reported on 08/10/2017 07/28/17   Daleen Bo, MD    Family History Family History  Problem Relation Age of Onset  . Colon cancer Mother   . Heart attack Father        67  . Diabetes Sister   . Diabetes Daughter        x 2  . Hypertension Neg Hx   . Stroke Neg Hx     Social History Social History   Tobacco Use  . Smoking status: Never Smoker  . Smokeless tobacco: Never Used  Substance Use Topics  . Alcohol use: No  . Drug use: No     Allergies   Latex   Review of Systems Review of Systems  Constitutional:       Per HPI, otherwise negative  HENT:       Per HPI, otherwise negative  Respiratory:       Per HPI, otherwise negative  Cardiovascular:       Per HPI, otherwise negative  Gastrointestinal: Negative for vomiting.  Endocrine:       Negative aside  from HPI  Genitourinary:       Neg aside from HPI   Musculoskeletal:       Per HPI, otherwise negative  Skin: Negative.   Neurological: Positive for weakness. Negative for syncope.     Physical Exam Updated Vital Signs BP (!) 143/60 (BP Location: Left Wrist)   Pulse 64   Temp (!) 97.3 F (36.3 C) (Oral)   Resp 17   SpO2 96%   Physical Exam  Constitutional: She is oriented to person, place, and  time. She appears well-developed and well-nourished. No distress.  Sickly yet obese elderly female awake and alert speaking clearly  HENT:  Head: Normocephalic and atraumatic.  Eyes: Conjunctivae and EOM are normal.  Cardiovascular: Normal rate and regular rhythm.  Murmur heard. Pulmonary/Chest: Effort normal.  Breath sounds unremarkable  Abdominal: She exhibits no distension. There is no tenderness. There is no guarding.  Musculoskeletal: She exhibits no edema.  Neurological: She is alert and oriented to person, place, and time. No cranial nerve deficit.  Skin: Skin is warm and dry.  Psychiatric: She has a normal mood and affect.  Nursing note and vitals reviewed.    ED Treatments / Results  Labs (all labs ordered are listed, but only abnormal results are displayed) Labs Reviewed  COMPREHENSIVE METABOLIC PANEL - Abnormal; Notable for the following components:      Result Value   Chloride 100 (*)    BUN 23 (*)    Total Protein 6.3 (*)    Albumin 3.2 (*)    GFR calc non Af Amer 55 (*)    All other components within normal limits  CBC WITH DIFFERENTIAL/PLATELET - Abnormal; Notable for the following components:   WBC 11.2 (*)    RDW 18.0 (*)    Monocytes Absolute 1.2 (*)    All other components within normal limits  BRAIN NATRIURETIC PEPTIDE - Abnormal; Notable for the following components:   B Natriuretic Peptide 108.4 (*)    All other components within normal limits  TROPONIN I - Abnormal; Notable for the following components:   Troponin I 0.03 (*)    All other  components within normal limits  LIPASE, BLOOD  URINALYSIS, ROUTINE W REFLEX MICROSCOPIC  CBG MONITORING, ED    EKG  EKG Interpretation  Date/Time:  Monday August 10 2017 09:12:45 EST Ventricular Rate:  62 PR Interval:    QRS Duration: 83 QT Interval:  439 QTC Calculation: 446 R Axis:   35 Text Interpretation:  Atrial fibrillation Nonspecific T abnormalities, lateral leads No significant change since last tracing Confirmed by Dorie Rank [phone]) on 08/10/2017 9:26:09 AM       Radiology Dg Chest 2 View  Result Date: 08/10/2017 CLINICAL DATA:  Diabetic.  Complaining of drowsiness. EXAM: CHEST  2 VIEW COMPARISON:  July 28, 2017 FINDINGS: The heart size and mediastinal contours are stable. The heart size is enlarged. There is no focal infiltrate, pulmonary edema, or pleural effusion. The visualized skeletal structures are stable. IMPRESSION: No active cardiopulmonary disease.  Cardiomegaly unchanged. Electronically Signed   By: Abelardo Diesel M.D.   On: 08/10/2017 10:55    Procedures Procedures (including critical care time)  Medications Ordered in ED Medications  0.9 %  sodium chloride infusion ( Intravenous New Bag/Given 08/10/17 0929)     Initial Impression / Assessment and Plan / ED Course  I have reviewed the triage vital signs and the nursing notes.  Pertinent labs & imaging results that were available during my care of the patient were reviewed by me and considered in my medical decision making (see chart for details).  Initial glucose 75   11:51 AM Patient afebrile, unremarkable vital signs.  When she has eaten breakfast. Patient states that she would like to leave. She is aware of initial findings including borderline elevated troponin, BNP, leukocytosis, and pending urinalysis. She states that she does not care if she has a urinary tract infection, will follow up with her primary care physician per She states that she will call today  or tomorrow for  outpatient follow-up. She states that she feels otherwise fine, and would like to go see her husband who is also being evaluated. I cautioned her on the importance of close outpatient follow-up given her decision to depart The patient does have multiple mild abnormalities, there is suspicion for deconditioning, no overt evidence for ACS, bacteremia, sepsis, patient allowed to depart, per request.  Final Clinical Impressions(s) / ED Diagnoses  Weakness   Carmin Muskrat, MD 08/10/17 1152

## 2017-08-10 NOTE — ED Notes (Signed)
Bed: WA20 Expected date:  Expected time:  Means of arrival:  Comments: EMS-headache

## 2018-02-04 ENCOUNTER — Encounter (INDEPENDENT_AMBULATORY_CARE_PROVIDER_SITE_OTHER): Payer: Medicare Other | Admitting: Ophthalmology

## 2018-02-19 ENCOUNTER — Emergency Department (HOSPITAL_COMMUNITY): Payer: Medicare Other

## 2018-02-19 ENCOUNTER — Observation Stay (HOSPITAL_COMMUNITY)
Admission: EM | Admit: 2018-02-19 | Discharge: 2018-02-21 | Disposition: A | Discharge status: Home / Self Care | Payer: Medicare Other | Attending: Family Medicine | Admitting: Family Medicine

## 2018-02-19 ENCOUNTER — Encounter (HOSPITAL_COMMUNITY): Payer: Self-pay | Admitting: *Deleted

## 2018-02-19 ENCOUNTER — Encounter (HOSPITAL_COMMUNITY): Payer: Medicare Other

## 2018-02-19 ENCOUNTER — Other Ambulatory Visit: Payer: Self-pay

## 2018-02-19 ENCOUNTER — Emergency Department (HOSPITAL_BASED_OUTPATIENT_CLINIC_OR_DEPARTMENT_OTHER): Payer: Medicare Other

## 2018-02-19 DIAGNOSIS — Z90710 Acquired absence of both cervix and uterus: Secondary | ICD-10-CM | POA: Insufficient documentation

## 2018-02-19 DIAGNOSIS — K5730 Diverticulosis of large intestine without perforation or abscess without bleeding: Secondary | ICD-10-CM | POA: Insufficient documentation

## 2018-02-19 DIAGNOSIS — E785 Hyperlipidemia, unspecified: Secondary | ICD-10-CM | POA: Diagnosis not present

## 2018-02-19 DIAGNOSIS — K279 Peptic ulcer, site unspecified, unspecified as acute or chronic, without hemorrhage or perforation: Secondary | ICD-10-CM | POA: Diagnosis not present

## 2018-02-19 DIAGNOSIS — M79609 Pain in unspecified limb: Secondary | ICD-10-CM | POA: Diagnosis not present

## 2018-02-19 DIAGNOSIS — I420 Dilated cardiomyopathy: Secondary | ICD-10-CM | POA: Insufficient documentation

## 2018-02-19 DIAGNOSIS — R609 Edema, unspecified: Secondary | ICD-10-CM | POA: Insufficient documentation

## 2018-02-19 DIAGNOSIS — Z794 Long term (current) use of insulin: Secondary | ICD-10-CM | POA: Diagnosis not present

## 2018-02-19 DIAGNOSIS — J45909 Unspecified asthma, uncomplicated: Secondary | ICD-10-CM | POA: Insufficient documentation

## 2018-02-19 DIAGNOSIS — Z7982 Long term (current) use of aspirin: Secondary | ICD-10-CM | POA: Diagnosis not present

## 2018-02-19 DIAGNOSIS — Z7984 Long term (current) use of oral hypoglycemic drugs: Secondary | ICD-10-CM | POA: Diagnosis not present

## 2018-02-19 DIAGNOSIS — Z8711 Personal history of peptic ulcer disease: Secondary | ICD-10-CM | POA: Diagnosis not present

## 2018-02-19 DIAGNOSIS — I700 Atherosclerosis of aorta: Secondary | ICD-10-CM | POA: Diagnosis not present

## 2018-02-19 DIAGNOSIS — Z87442 Personal history of urinary calculi: Secondary | ICD-10-CM | POA: Insufficient documentation

## 2018-02-19 DIAGNOSIS — I77810 Thoracic aortic ectasia: Secondary | ICD-10-CM | POA: Diagnosis present

## 2018-02-19 DIAGNOSIS — Z833 Family history of diabetes mellitus: Secondary | ICD-10-CM | POA: Diagnosis not present

## 2018-02-19 DIAGNOSIS — E1169 Type 2 diabetes mellitus with other specified complication: Secondary | ICD-10-CM

## 2018-02-19 DIAGNOSIS — I10 Essential (primary) hypertension: Secondary | ICD-10-CM | POA: Diagnosis not present

## 2018-02-19 DIAGNOSIS — I4891 Unspecified atrial fibrillation: Secondary | ICD-10-CM | POA: Insufficient documentation

## 2018-02-19 DIAGNOSIS — H5462 Unqualified visual loss, left eye, normal vision right eye: Secondary | ICD-10-CM | POA: Insufficient documentation

## 2018-02-19 DIAGNOSIS — I080 Rheumatic disorders of both mitral and aortic valves: Secondary | ICD-10-CM | POA: Diagnosis not present

## 2018-02-19 DIAGNOSIS — R918 Other nonspecific abnormal finding of lung field: Secondary | ICD-10-CM | POA: Diagnosis not present

## 2018-02-19 DIAGNOSIS — Z8249 Family history of ischemic heart disease and other diseases of the circulatory system: Secondary | ICD-10-CM | POA: Diagnosis not present

## 2018-02-19 DIAGNOSIS — Z6837 Body mass index (BMI) 37.0-37.9, adult: Secondary | ICD-10-CM | POA: Insufficient documentation

## 2018-02-19 DIAGNOSIS — R079 Chest pain, unspecified: Secondary | ICD-10-CM | POA: Diagnosis not present

## 2018-02-19 DIAGNOSIS — Z8673 Personal history of transient ischemic attack (TIA), and cerebral infarction without residual deficits: Secondary | ICD-10-CM | POA: Insufficient documentation

## 2018-02-19 DIAGNOSIS — Z87440 Personal history of urinary (tract) infections: Secondary | ICD-10-CM | POA: Insufficient documentation

## 2018-02-19 DIAGNOSIS — R0789 Other chest pain: Secondary | ICD-10-CM | POA: Diagnosis present

## 2018-02-19 DIAGNOSIS — M7989 Other specified soft tissue disorders: Secondary | ICD-10-CM | POA: Diagnosis not present

## 2018-02-19 DIAGNOSIS — Z823 Family history of stroke: Secondary | ICD-10-CM | POA: Diagnosis not present

## 2018-02-19 DIAGNOSIS — E1140 Type 2 diabetes mellitus with diabetic neuropathy, unspecified: Secondary | ICD-10-CM

## 2018-02-19 DIAGNOSIS — E669 Obesity, unspecified: Secondary | ICD-10-CM | POA: Insufficient documentation

## 2018-02-19 DIAGNOSIS — Z79899 Other long term (current) drug therapy: Secondary | ICD-10-CM | POA: Diagnosis not present

## 2018-02-19 DIAGNOSIS — I2510 Atherosclerotic heart disease of native coronary artery without angina pectoris: Secondary | ICD-10-CM | POA: Insufficient documentation

## 2018-02-19 DIAGNOSIS — Z91040 Latex allergy status: Secondary | ICD-10-CM | POA: Insufficient documentation

## 2018-02-19 DIAGNOSIS — Z9889 Other specified postprocedural states: Secondary | ICD-10-CM | POA: Insufficient documentation

## 2018-02-19 DIAGNOSIS — I25119 Atherosclerotic heart disease of native coronary artery with unspecified angina pectoris: Secondary | ICD-10-CM | POA: Diagnosis present

## 2018-02-19 DIAGNOSIS — Z800 Family history of malignant neoplasm of digestive organs: Secondary | ICD-10-CM | POA: Insufficient documentation

## 2018-02-19 LAB — COMPREHENSIVE METABOLIC PANEL
ALK PHOS: 59 U/L (ref 38–126)
ALT: 11 U/L — AB (ref 14–54)
AST: 20 U/L (ref 15–41)
Albumin: 3.1 g/dL — ABNORMAL LOW (ref 3.5–5.0)
Anion gap: 7 (ref 5–15)
BUN: 14 mg/dL (ref 6–20)
CALCIUM: 9.3 mg/dL (ref 8.9–10.3)
CHLORIDE: 99 mmol/L — AB (ref 101–111)
CO2: 31 mmol/L (ref 22–32)
Creatinine, Ser: 1.07 mg/dL — ABNORMAL HIGH (ref 0.44–1.00)
GFR calc non Af Amer: 45 mL/min — ABNORMAL LOW (ref >60)
GFR, EST AFRICAN AMERICAN: 52 mL/min — AB (ref >60)
Glucose, Bld: 131 mg/dL — ABNORMAL HIGH (ref 65–99)
Potassium: 4.4 mmol/L (ref 3.5–5.1)
SODIUM: 137 mmol/L (ref 135–145)
Total Bilirubin: 0.6 mg/dL (ref 0.3–1.2)
Total Protein: 6.1 g/dL — ABNORMAL LOW (ref 6.5–8.1)

## 2018-02-19 LAB — CBC WITH DIFFERENTIAL/PLATELET
Abs Immature Granulocytes: 0 10*3/uL (ref 0.0–0.1)
Basophils Absolute: 0.1 10*3/uL (ref 0.0–0.1)
Basophils Relative: 1 %
EOS ABS: 0.7 10*3/uL (ref 0.0–0.7)
Eosinophils Relative: 8 %
HCT: 39.2 % (ref 36.0–46.0)
HEMOGLOBIN: 12 g/dL (ref 12.0–15.0)
Immature Granulocytes: 0 %
LYMPHS ABS: 2.2 10*3/uL (ref 0.7–4.0)
LYMPHS PCT: 24 %
MCH: 25.2 pg — AB (ref 26.0–34.0)
MCHC: 30.6 g/dL (ref 30.0–36.0)
MCV: 82.2 fL (ref 78.0–100.0)
MONO ABS: 0.8 10*3/uL (ref 0.1–1.0)
MONOS PCT: 9 %
Neutro Abs: 5.3 10*3/uL (ref 1.7–7.7)
Neutrophils Relative %: 58 %
Platelets: 318 10*3/uL (ref 150–400)
RBC: 4.77 MIL/uL (ref 3.87–5.11)
RDW: 17.1 % — AB (ref 11.5–15.5)
WBC: 9.1 10*3/uL (ref 4.0–10.5)

## 2018-02-19 LAB — CBG MONITORING, ED: Glucose-Capillary: 122 mg/dL — ABNORMAL HIGH (ref 65–99)

## 2018-02-19 LAB — I-STAT TROPONIN, ED: Troponin i, poc: 0.01 ng/mL (ref 0.00–0.08)

## 2018-02-19 LAB — TROPONIN I

## 2018-02-19 LAB — GLUCOSE, CAPILLARY: Glucose-Capillary: 243 mg/dL — ABNORMAL HIGH (ref 65–99)

## 2018-02-19 LAB — LIPASE, BLOOD: Lipase: 20 U/L (ref 11–51)

## 2018-02-19 MED ORDER — GI COCKTAIL ~~LOC~~: 30.0000 mL | Freq: Four times a day (QID) | ORAL | Status: DC | PRN

## 2018-02-19 MED ORDER — ATORVASTATIN CALCIUM 20 MG PO TABS
10.0000 mg | ORAL_TABLET | Freq: Every day | ORAL | Status: DC
  Administered 2018-02-19 – 2018-02-21 (×3): 10 mg via ORAL
  Filled 2018-02-19 (×3): qty 1

## 2018-02-19 MED ORDER — IOPAMIDOL (ISOVUE-370) INJECTION 76%
INTRAVENOUS | Status: AC
  Filled 2018-02-19: qty 100

## 2018-02-19 MED ORDER — INSULIN ASPART 100 UNIT/ML ~~LOC~~ SOLN
0.0000 [IU] | Freq: Three times a day (TID) | SUBCUTANEOUS | Status: DC
  Administered 2018-02-20: 1 [IU] via SUBCUTANEOUS
  Administered 2018-02-20: 2 [IU] via SUBCUTANEOUS
  Administered 2018-02-20 – 2018-02-21 (×2): 3 [IU] via SUBCUTANEOUS
  Administered 2018-02-21: 2 [IU] via SUBCUTANEOUS

## 2018-02-19 MED ORDER — ONDANSETRON HCL 4 MG/2ML IJ SOLN: 4.0000 mg | Freq: Four times a day (QID) | INTRAMUSCULAR | Status: DC | PRN

## 2018-02-19 MED ORDER — LORATADINE 10 MG PO TABS
10.0000 mg | ORAL_TABLET | Freq: Every day | ORAL | Status: DC
  Administered 2018-02-20 – 2018-02-21 (×2): 10 mg via ORAL
  Filled 2018-02-19 (×2): qty 1

## 2018-02-19 MED ORDER — IOPAMIDOL (ISOVUE-370) INJECTION 76%
100.0000 mL | Freq: Once | INTRAVENOUS | Status: AC | PRN
  Administered 2018-02-19: 100 mL via INTRAVENOUS

## 2018-02-19 MED ORDER — PREDNISOLONE ACETATE 1 % OP SUSP
1.0000 [drp] | Freq: Two times a day (BID) | OPHTHALMIC | Status: DC
  Administered 2018-02-20 – 2018-02-21 (×3): 1 [drp] via OPHTHALMIC
  Filled 2018-02-19: qty 5

## 2018-02-19 MED ORDER — DONEPEZIL HCL 10 MG PO TABS
10.0000 mg | ORAL_TABLET | Freq: Every day | ORAL | Status: DC
  Administered 2018-02-19 – 2018-02-20 (×2): 10 mg via ORAL
  Filled 2018-02-19 (×2): qty 1

## 2018-02-19 MED ORDER — BUDESONIDE 0.5 MG/2ML IN SUSP
2.0000 mL | Freq: Two times a day (BID) | RESPIRATORY_TRACT | Status: DC
  Administered 2018-02-20 – 2018-02-21 (×3): 0.5 mg via RESPIRATORY_TRACT
  Filled 2018-02-19 (×3): qty 2

## 2018-02-19 MED ORDER — NITROGLYCERIN 2 % TD OINT: 0.5000 [in_us] | TOPICAL_OINTMENT | Freq: Once | TRANSDERMAL | Status: DC

## 2018-02-19 MED ORDER — LOSARTAN POTASSIUM 50 MG PO TABS
100.0000 mg | ORAL_TABLET | Freq: Every day | ORAL | Status: DC
  Administered 2018-02-20 – 2018-02-21 (×2): 100 mg via ORAL
  Filled 2018-02-19 (×2): qty 2

## 2018-02-19 MED ORDER — POLYETHYLENE GLYCOL 3350 17 GM/SCOOP PO POWD
17.0000 g | Freq: Every day | ORAL | Status: DC
  Filled 2018-02-19: qty 255

## 2018-02-19 MED ORDER — OXYBUTYNIN CHLORIDE ER 10 MG PO TB24
10.0000 mg | ORAL_TABLET | Freq: Every day | ORAL | Status: DC
  Administered 2018-02-20 – 2018-02-21 (×2): 10 mg via ORAL
  Filled 2018-02-19 (×2): qty 1

## 2018-02-19 MED ORDER — AMITRIPTYLINE HCL 10 MG PO TABS
10.0000 mg | ORAL_TABLET | Freq: Every day | ORAL | Status: DC
  Administered 2018-02-19 – 2018-02-20 (×2): 10 mg via ORAL
  Filled 2018-02-19 (×3): qty 1

## 2018-02-19 MED ORDER — MORPHINE SULFATE (PF) 4 MG/ML IV SOLN: 2.0000 mg | INTRAVENOUS | Status: DC | PRN

## 2018-02-19 MED ORDER — ALPRAZOLAM 0.25 MG PO TABS
0.5000 mg | ORAL_TABLET | Freq: Every day | ORAL | Status: DC
Start: 2018-02-19 — End: 2018-02-21
  Administered 2018-02-19 – 2018-02-20 (×2): 1 mg via ORAL
  Filled 2018-02-19 (×2): qty 4

## 2018-02-19 MED ORDER — POLYETHYLENE GLYCOL 3350 17 G PO PACK
17.0000 g | PACK | Freq: Every day | ORAL | Status: DC
  Administered 2018-02-21: 17 g via ORAL
  Filled 2018-02-19: qty 1

## 2018-02-19 MED ORDER — HYDROCHLOROTHIAZIDE 12.5 MG PO CAPS
12.5000 mg | ORAL_CAPSULE | Freq: Every day | ORAL | Status: DC
  Administered 2018-02-20 – 2018-02-21 (×2): 12.5 mg via ORAL
  Filled 2018-02-19 (×2): qty 1

## 2018-02-19 MED ORDER — ASPIRIN EC 325 MG PO TBEC
325.0000 mg | DELAYED_RELEASE_TABLET | Freq: Every day | ORAL | Status: DC
  Administered 2018-02-20 – 2018-02-21 (×2): 325 mg via ORAL
  Filled 2018-02-19 (×2): qty 1

## 2018-02-19 MED ORDER — ALBUTEROL SULFATE (2.5 MG/3ML) 0.083% IN NEBU: 3.0000 mL | INHALATION_SOLUTION | RESPIRATORY_TRACT | Status: DC | PRN

## 2018-02-19 MED ORDER — ACETAMINOPHEN 500 MG PO TABS: 1000.0000 mg | ORAL_TABLET | Freq: Three times a day (TID) | ORAL | Status: DC | PRN

## 2018-02-19 MED ORDER — CULTURELLE PO CAPS: 1.0000 | ORAL_CAPSULE | Freq: Every day | ORAL | Status: DC

## 2018-02-19 MED ORDER — ACETAMINOPHEN 325 MG PO TABS: 650.0000 mg | ORAL_TABLET | ORAL | Status: DC | PRN

## 2018-02-19 MED ORDER — ASPIRIN EC 325 MG PO TBEC: 325.0000 mg | DELAYED_RELEASE_TABLET | Freq: Every day | ORAL | Status: DC

## 2018-02-19 MED ORDER — CRANBERRY-VITAMIN C 250-60 MG PO CAPS: 2.0000 | ORAL_CAPSULE | Freq: Every day | ORAL | Status: DC

## 2018-02-19 MED ORDER — LOSARTAN POTASSIUM-HCTZ 100-12.5 MG PO TABS: 1.0000 | ORAL_TABLET | Freq: Every day | ORAL | Status: DC

## 2018-02-19 NOTE — ED Notes (Signed)
Pt waiting for CT. 

## 2018-02-19 NOTE — ED Notes (Signed)
Patient transported to CT 

## 2018-02-19 NOTE — ED Notes (Signed)
Pt assisted to wheelchair and brought to bathroom.

## 2018-02-19 NOTE — ED Provider Notes (Addendum)
[facility] Provider Note   CSN: [id number] Arrival date & time: 02/19/18  1303     History   Chief Complaint Chief Complaint  Patient presents with  . Chest Pain    HPI Cassidy [name] is a 84 y.o. female.  HPI  84 year old female history of aortic stenosis, A. fib, CAD presents today with onset of lower anterior chest pain this morning.  She describes it as sharp in nature and radiating through to her back.  Daughter states that she appeared to be short of breath but patient does not complain of dyspnea.  She has some early dementia.  She and daughter reports some leg pain and swelling.  This pain is new.  It is sharp in nature.  She presented to her doctor's office here for further daughter also states that she has had worsening cough over the past 3 days and is coughing up white mucus.  She reports the baseline cough has been present for years.  They deny any underlying lung disease and she has no history of smoking.  Past Medical History:  Diagnosis Date  . Aortic stenosis    mild, echo, February, 2009  . Ascending aorta dilatation (HCC)    mild, echo, February, 2009  . Asthma   . Atrial fibrillation (HCC)    Coumadin is not used per the patient because of her eyes  . Blindness of left eye    related to some type of stroke in the past  . CAD (coronary artery disease)    stents right coronary artery 2001  /  nuclear, March, 2009, no ischemia  . Colon polyp   . Diverticulosis   . DM (diabetes mellitus) ([place])   . Dyslipidemia   . Ejection fraction    EF 70%, nuclear, 2009, normal, echo, 2009  . Falling episodes   . HTN (hypertension)   . Kidney stones   . Obesity   . PUD (peptic ulcer disease)   . UTI (urinary tract infection)   . Ventral hernia     Patient Active Problem List   Diagnosis Date Noted  . Sepsis ([place]) 07/22/2016  . UTI (urinary tract infection) 07/22/2016  . Duodenal ulcer 11/07/2013  . Gastric ulcer with  hemorrhage 11/07/2013  . Upper GI bleed 11/06/2013  . Unspecified constipation 09/19/2013  . Family history of malignant neoplasm of gastrointestinal tract 09/19/2013  . Preop cardiovascular exam 09/11/2011  . CAD (coronary artery disease)   . Permanent atrial fibrillation ([place])   . Essential hypertension   . Diverticulosis   . Colon polyp   . Aortic stenosis   . Ascending aorta dilatation (HCC)   . Asthma   . Obesity   . Dyslipidemia   . Ejection fraction   . Type 2 diabetes mellitus with diabetic neuropathy, with long-term current use of insulin ([place])   . Falling episodes     Past Surgical History:  Procedure Laterality Date  . APPENDECTOMY    . ESOPHAGOGASTRODUODENOSCOPY N/A 11/07/2013   Procedure: ESOPHAGOGASTRODUODENOSCOPY (EGD);  Surgeon: Lafayette Dragon, MD;  Location: [facility];  Service: Endoscopy;  Laterality: N/A;  . TONSILLECTOMY    . TOTAL ABDOMINAL HYSTERECTOMY       OB History   None      Home Medications    Prior to Admission medications   Medication Sig Start Date End Date Taking? Authorizing Provider  acetaminophen (TYLENOL) 650 MG CR tablet Take 650 mg by mouth every 8 (eight) hours  as needed for pain.    [provider]  amitriptyline (ELAVIL) 10 MG tablet Take 10 mg by mouth at bedtime.    [provider]  atorvastatin (LIPITOR) 10 MG tablet Take 1 tablet (10 mg total) by mouth daily. 01/16/17 04/16/17  Jettie Booze, MD  atorvastatin (LIPITOR) 10 MG tablet Take 10 mg by mouth every other day.  07/28/17   [provider]  calcium carbonate (OS-CAL - DOSED IN MG OF ELEMENTAL CALCIUM) 1250 (500 Ca) MG tablet Take 1 tablet by mouth daily with breakfast.    [provider]  donepezil (ARICEPT) 10 MG tablet Take 10 mg by mouth at bedtime.     [provider]  insulin NPH-insulin regular (NOVOLIN 70/30) (70-30) 100 UNIT/ML injection Inject 15-30 Units into the skin 2 (two) times daily with a meal. 30 units in  the morning and 15 units at night    [provider]  losartan-hydrochlorothiazide (HYZAAR) 100-12.5 MG tablet Take 1 tablet by mouth daily with breakfast. 06/25/16   [provider]  metFORMIN (GLUCOPHAGE) 500 MG tablet Take 500 mg by mouth 2 (two) times daily with a meal.    [provider]  nystatin cream (MYCOSTATIN) Apply 1 application topically 3 (three) times daily as needed (for yeast infection on vagina).  06/14/16   [provider]  oxybutynin (DITROPAN-XL) 10 MG 24 hr tablet Take 10 mg by mouth daily with breakfast.  12/29/15   [provider]  polyethylene glycol powder (GLYCOLAX/MIRALAX) powder Take 17 g by mouth daily as needed for mild constipation.  05/21/17   [provider]  predniSONE (DELTASONE) 20 MG tablet Take 1 tablet (20 mg total) by mouth 2 (two) times daily. Patient not taking: Reported on 08/10/2017 07/28/17   Daleen Bo, MD  Probiotic Product (PROBIOTIC COLON SUPPORT) CAPS Take 1 capsule by mouth daily with breakfast.    [provider]  psyllium (METAMUCIL) 58.6 % powder Take 1 packet by mouth daily.    [provider]  sorbitol 70 % SOLN Take 20 mLs by mouth daily as needed for moderate constipation. 07/25/16   Nita Sells, MD    Family History Family History  Problem Relation Age of Onset  . Colon cancer Mother   . Heart attack Father        62  . Diabetes Sister   . Diabetes Daughter        x 2  . Hypertension Neg Hx   . Stroke Neg Hx     Social History Social History   Tobacco Use  . Smoking status: Never Smoker  . Smokeless tobacco: Never Used  Substance Use Topics  . Alcohol use: No  . Drug use: No     Allergies   Latex   Review of Systems Review of Systems  All other systems reviewed and are negative.    Physical Exam Updated Vital Signs BP (!) 150/68 (BP Location: Right Arm)   Pulse (!) 54   Temp 98.1 F (36.7 C) (Oral)   Resp 20   SpO2 95%    Physical Exam  Constitutional: She is oriented to person, place, and time. She appears well-developed and well-nourished.  HENT:  Head: Normocephalic and atraumatic.  Eyes: Pupils are equal, round, and reactive to light. EOM are normal.  Neck: Normal range of motion. Neck supple.  Cardiovascular: Normal pulses. An irregularly irregular rhythm present.  Murmur heard.  Systolic murmur is present with a grade of 4/6. Pulmonary/Chest:  Some decreased breath sounds at bases with few rhonchi  Abdominal: Soft. Bowel sounds are normal. She exhibits no distension. There is no tenderness.  Musculoskeletal: Normal range of motion.  Neurological: She is alert and oriented to person, place, and time.  Skin: Skin is warm and dry. Capillary refill takes less than 2 seconds.  Psychiatric: She has a normal mood and affect.  Nursing note and vitals reviewed.    ED Treatments / Results  Labs (all labs ordered are listed, but only abnormal results are displayed) Labs Reviewed  COMPREHENSIVE METABOLIC PANEL  LIPASE, BLOOD  CBC WITH DIFFERENTIAL/PLATELET  BRAIN NATRIURETIC PEPTIDE  I-STAT TROPONIN, ED    EKG EKG Interpretation  Date/Time:  Friday February 19 2018 13:03:43 EDT Ventricular Rate:  58 PR Interval:    QRS Duration: 82 QT Interval:  441 QTC Calculation: 434 R Axis:   29 Text Interpretation:  Atrial fibrillation Probable anteroseptal infarct, old No significant change since last tracing Confirmed by Pattricia Boss [phone]) on 02/19/2018 1:14:08 PM Also confirmed by Pattricia Boss [phone]), editor Philomena Doheny [phone])  on 02/19/2018 1:45:37 PM   Radiology Dg Chest 2 View  Result Date: 02/19/2018 CLINICAL DATA:  Chest pain EXAM: CHEST - 2 VIEW COMPARISON:  08/10/2017. FINDINGS: Cardiomegaly. Mild bilateral interstitial prominence. Mild left base subsegmental atelectasis. No pleural effusion or pneumothorax. IMPRESSION: 1. Cardiomegaly. Mild bilateral interstitial prominence. Mild CHF  could present and this fashion. 2.  Mild left base subsegmental atelectasis/infiltrate. Electronically Signed   By: Marcello Moores  Register   On: 02/19/2018 13:41    Procedures Procedures (including critical care time)  Medications Ordered in ED Medications - No data to display   Initial Impression / Assessment and Plan / ED Course  I have reviewed the triage vital signs and the nursing notes.  Pertinent labs & imaging results that were available during my care of the patient were reviewed by me and considered in my medical decision making (see chart for details).    84 yo female with sharp chest pain radiating to back today with ?associated dyspnea.  Work up here for [facility] evaluation, pancreatitis/intraabdominal etiology, pe, aortic dissection. EKG with chronic a fib- no evidence of acute ischemia, negative initial troponin PE/DVT- doppler negative- ct pending Aortic dissection - study pending Intraabdominal etiology- not significantly ttp and lipase,lft normal  Discussed with Dr. Sabra Heck- plan admission after ct resulted    Final Clinical Impressions(s) / ED Diagnoses   Final diagnoses:  Chest pain, unspecified type    ED Discharge Orders    None       Pattricia Boss, MD 02/19/18 1634    Pattricia Boss, MD 02/19/18 1706

## 2018-02-19 NOTE — Progress Notes (Signed)
*  Preliminary Results* Bilateral lower extremity venous duplex completed. Bilateral lower extremities are negative for deep vein thrombosis. There is no evidence of Baker's cyst bilaterally.  02/19/2018 3:47 PM Maudry Mayhew, BS, RVT, RDCS, RDMS

## 2018-02-19 NOTE — ED Notes (Signed)
Pt given turkey sandwich and milk.

## 2018-02-19 NOTE — H&P (Signed)
History and Physical    ELLOWYN RIEVES FUX:[id number] DOB: 02-03-1927 DOA: 02/19/2018  PCP: [name], MD  Patient coming from: Home  Chief Complaint: Chest pain  HPI: KANDRA GRAVEN is a 84 y.o. female with medical history significant of aortic dilation, asthma, A. fib, coronary artery disease, hypertension comes in with substernal chest pain that occurred earlier today that lasted over an hour that spontaneously resolved.  Patient does not recall what relieved her pain or made it worse.  She denies any fevers or cough.  She denies any shortness of breath.  Her pain has been relieved now for several hours.  She denies any lower extremity edema or swelling.  Patient is being referred for admission for rule out ACS due to her multiple risk factors.  Review of Systems: As per HPI otherwise 10 point review of systems negative.   Past Medical History:  Diagnosis Date  . Aortic stenosis    mild, echo, February, 2009  . Ascending aorta dilatation (HCC)    mild, echo, February, 2009  . Asthma   . Atrial fibrillation (HCC)    Coumadin is not used per the patient because of her eyes  . Blindness of left eye    related to some type of stroke in the past  . CAD (coronary artery disease)    stents right coronary artery 2001  /  nuclear, March, 2009, no ischemia  . Colon polyp   . Diverticulosis   . DM (diabetes mellitus) ([place])   . Dyslipidemia   . Ejection fraction    EF 70%, nuclear, 2009, normal, echo, 2009  . Falling episodes   . HTN (hypertension)   . Kidney stones   . Obesity   . PUD (peptic ulcer disease)   . UTI (urinary tract infection)   . Ventral hernia     Past Surgical History:  Procedure Laterality Date  . APPENDECTOMY    . ESOPHAGOGASTRODUODENOSCOPY N/A 11/07/2013   Procedure: ESOPHAGOGASTRODUODENOSCOPY (EGD);  Surgeon: Lafayette Dragon, MD;  Location: [facility];  Service: Endoscopy;  Laterality: N/A;  . TONSILLECTOMY    . TOTAL ABDOMINAL HYSTERECTOMY       reports that she has never smoked. She has never used smokeless tobacco. She reports that she does not drink alcohol or use drugs.  Allergies  Allergen Reactions  . Latex Rash    Family History  Problem Relation Age of Onset  . Colon cancer Mother   . Heart attack Father        14  . Diabetes Sister   . Diabetes Daughter        x 2  . Hypertension Neg Hx   . Stroke Neg Hx     Prior to Admission medications   Medication Sig Start Date End Date Taking? Authorizing Provider  acetaminophen (TYLENOL) 500 MG tablet Take 1,000 mg by mouth every 8 (eight) hours as needed (for pain).   Yes [provider]  ALPRAZolam Duanne Moron) 1 MG tablet Take 0.5-1 mg by mouth at bedtime.    Yes [provider]  amitriptyline (ELAVIL) 10 MG tablet Take 10 mg by mouth at bedtime.   Yes [provider]  aspirin EC 325 MG tablet Take 325 mg by mouth daily.   Yes [provider]  atorvastatin (LIPITOR) 10 MG tablet Take 1 tablet (10 mg total) by mouth daily. Patient taking differently: Take 10 mg by mouth every Monday, Wednesday, and Friday.  01/16/17 02/19/18 Yes Jettie Booze,  MD  Calcium Carb-Cholecalciferol (CALTRATE 600+D3 PO) Take 1 tablet by mouth daily with breakfast.   Yes [provider]  Cranberry-Vitamin C (AZO CRANBERRY URINARY TRACT) 250-60 MG CAPS Take 2 capsules by mouth daily with breakfast.   Yes [provider]  donepezil (ARICEPT) 10 MG tablet Take 10 mg by mouth at bedtime.    Yes [provider]  fluconazole (DIFLUCAN) 150 MG tablet Take 150 mg by mouth once as needed (for intermittent yeast infections).   Yes [provider]  insulin NPH-regular Human (NOVOLIN 70/30 RELION) (70-30) 100 UNIT/ML injection Inject 15-28 Units into the skin See admin instructions. Inject 28 units into the skin after breakfast and 15 units at bedtime   Yes [provider]  Lactobacillus Rhamnosus, GG, (CULTURELLE) CAPS Take 1  capsule by mouth daily.   Yes [provider]  loratadine (CLARITIN) 10 MG tablet Take 10 mg by mouth daily.   Yes [provider]  losartan-hydrochlorothiazide (HYZAAR) 100-12.5 MG tablet Take 1 tablet by mouth daily with breakfast. 06/25/16  Yes [provider]  metFORMIN (GLUCOPHAGE) 500 MG tablet Take 500 mg by mouth 2 (two) times daily with a meal.   Yes [provider]  nystatin cream (MYCOSTATIN) Apply 1 application topically 3 (three) times daily as needed (for vaginal yeast infections).  06/14/16  Yes [provider]  oxybutynin (DITROPAN-XL) 10 MG 24 hr tablet Take 10 mg by mouth daily with breakfast.  12/29/15  Yes [provider]  polyethylene glycol powder (GLYCOLAX/MIRALAX) powder Take 17 g by mouth daily.  05/21/17  Yes [provider]  prednisoLONE acetate (PRED FORTE) 1 % ophthalmic suspension Place 1 drop into the left eye See admin instructions. Instill 1 drop into the left eye four times a day for 7 days then decrease to 1 drop two times a day for another 7 days then schedule a  follow up visit with MD 02/16/18 02/22/18 Yes [provider]  Psyllium (METAMUCIL FIBER PO) Take 10 g by mouth daily with breakfast.   Yes [provider]  albuterol (PROVENTIL) (2.5 MG/3ML) 0.083% nebulizer solution Take 3 mLs by nebulization every 4 (four) hours as needed for wheezing. 08/04/17   [provider]  budesonide (PULMICORT) 0.5 MG/2ML nebulizer solution Take 2 mLs by nebulization 2 (two) times daily. TO BE MIXED WITH ALBUTEROL 08/04/17   [provider]  predniSONE (DELTASONE) 20 MG tablet Take 1 tablet (20 mg total) by mouth 2 (two) times daily. Patient not taking: Reported on 02/19/2018 07/28/17   Daleen Bo, MD  sorbitol 70 % SOLN Take 20 mLs by mouth daily as needed for moderate constipation. Patient not taking: Reported on 02/19/2018 07/25/16   Nita Sells, MD    Physical  Exam: Vitals:   02/19/18 1400 02/19/18 1415 02/19/18 1430 02/19/18 1445  BP: (!) 161/69 (!) 163/69 (!) 157/71 (!) 151/78  Pulse: (!) 59 60 63 61  Resp: 18 17 16  (!) 21  Temp:      TempSrc:      SpO2: 96% 97% 96% 96%      Constitutional: NAD, calm, comfortable Vitals:   02/19/18 1400 02/19/18 1415 02/19/18 1430 02/19/18 1445  BP: (!) 161/69 (!) 163/69 (!) 157/71 (!) 151/78  Pulse: (!) 59 60 63 61  Resp: 18 17 16  (!) 21  Temp:      TempSrc:      SpO2: 96% 97% 96% 96%   Eyes: PERRL, lids and conjunctivae normal ENMT: Mucous membranes  are moist. Posterior pharynx clear of any exudate or lesions.Normal dentition.  Neck: normal, supple, no masses, no thyromegaly Respiratory: clear to auscultation bilaterally, no wheezing, no crackles. Normal respiratory effort. No accessory muscle use.  Cardiovascular: Regular rate and rhythm, no murmurs / rubs / gallops. No extremity edema. 2+ pedal pulses. No carotid bruits.  Abdomen: no tenderness, no masses palpated. No hepatosplenomegaly. Bowel sounds positive.  Musculoskeletal: no clubbing / cyanosis. No joint deformity upper and lower extremities. Good ROM, no contractures. Normal muscle tone.  Skin: no rashes, lesions, ulcers. No induration Neurologic: CN 2-12 grossly intact. Sensation intact, DTR normal. Strength 5/5 in all 4.  Psychiatric: Normal judgment and insight. Alert and oriented x 3. Normal mood.    Labs on Admission: I have personally reviewed following labs and imaging studies  CBC: Recent Labs  Lab 02/19/18 1351  WBC 9.1  NEUTROABS 5.3  HGB 12.0  HCT 39.2  MCV 82.2  PLT 580   Basic Metabolic Panel: Recent Labs  Lab 02/19/18 1351  NA 137  K 4.4  CL 99*  CO2 31  GLUCOSE 131*  BUN 14  CREATININE 1.07*  CALCIUM 9.3   GFR: CrCl cannot be calculated (Unknown ideal weight.). Liver Function Tests: Recent Labs  Lab 02/19/18 1351  AST 20  ALT 11*  ALKPHOS 59  BILITOT 0.6  PROT 6.1*  ALBUMIN 3.1*   Recent  Labs  Lab 02/19/18 1351  LIPASE 20   No results for input(s): AMMONIA in the last 168 hours. Coagulation Profile: No results for input(s): INR, PROTIME in the last 168 hours. Cardiac Enzymes: No results for input(s): CKTOTAL, CKMB, CKMBINDEX, TROPONINI in the last 168 hours. BNP (last 3 results) No results for input(s): PROBNP in the last 8760 hours. HbA1C: No results for input(s): HGBA1C in the last 72 hours. CBG: Recent Labs  Lab 02/19/18 1402  GLUCAP 122*   Lipid Profile: No results for input(s): CHOL, HDL, LDLCALC, TRIG, CHOLHDL, LDLDIRECT in the last 72 hours. Thyroid Function Tests: No results for input(s): TSH, T4TOTAL, FREET4, T3FREE, THYROIDAB in the last 72 hours. Anemia Panel: No results for input(s): VITAMINB12, FOLATE, FERRITIN, TIBC, IRON, RETICCTPCT in the last 72 hours. Urine analysis:    Component Value Date/Time   COLORURINE YELLOW 07/22/2016 2136   APPEARANCEUR CLOUDY (A) 07/22/2016 2136   LABSPEC 1.015 07/22/2016 2136   PHURINE 5.5 07/22/2016 2136   GLUCOSEU NEGATIVE 07/22/2016 2136   HGBUR MODERATE (A) 07/22/2016 2136   BILIRUBINUR NEGATIVE 07/22/2016 2136   KETONESUR NEGATIVE 07/22/2016 2136   PROTEINUR NEGATIVE 07/22/2016 2136   UROBILINOGEN 0.2 04/27/2011 0144   NITRITE POSITIVE (A) 07/22/2016 2136   LEUKOCYTESUR TRACE (A) 07/22/2016 2136   Sepsis Labs: !!!!!!!!!!!!!!!!!!!!!!!!!!!!!!!!!!!!!!!!!!!! @LABRCNTIP (procalcitonin:4,lacticidven:4) )No results found for this or any previous visit (from the past 240 hour(s)).   Radiological Exams on Admission: Dg Chest 2 View  Result Date: 02/19/2018 CLINICAL DATA:  Chest pain EXAM: CHEST - 2 VIEW COMPARISON:  08/10/2017. FINDINGS: Cardiomegaly. Mild bilateral interstitial prominence. Mild left base subsegmental atelectasis. No pleural effusion or pneumothorax. IMPRESSION: 1. Cardiomegaly. Mild bilateral interstitial prominence. Mild CHF could present and this fashion. 2.  Mild left base subsegmental  atelectasis/infiltrate. Electronically Signed   By: Marcello Moores  Register   On: 02/19/2018 13:41   Ct Angio Chest/abd/pel For Dissection W And/or Wo Contrast  Result Date: 02/19/2018 CLINICAL DATA:  Acute chest and back pain. EXAM: CT ANGIOGRAPHY CHEST, ABDOMEN AND PELVIS TECHNIQUE: Multidetector CT imaging through the chest, abdomen and pelvis was performed  using the standard protocol during bolus administration of intravenous contrast. Multiplanar reconstructed images and MIPs were obtained and reviewed to evaluate the vascular anatomy. CONTRAST:  80 mL ISOVUE-370 IOPAMIDOL (ISOVUE-370) INJECTION 76% COMPARISON:  CT scan of August 30, 2004. FINDINGS: CTA CHEST FINDINGS Cardiovascular: Atherosclerosis of thoracic aorta is noted without aneurysm or dissection. No pericardial effusion is noted. Mild coronary artery calcifications are noted. Mediastinum/Nodes: No enlarged mediastinal, hilar, or axillary lymph nodes. Thyroid gland, trachea, and esophagus demonstrate no significant findings. Lungs/Pleura: No pneumothorax or pleural effusion is noted. Mosaic pattern is seen involving the lungs most consistent with small airways disease. Musculoskeletal: No chest wall abnormality. No acute or significant osseous findings. Review of the MIP images confirms the above findings. CTA ABDOMEN AND PELVIS FINDINGS VASCULAR Aorta: Atherosclerosis of abdominal aorta is noted without aneurysm or dissection. Celiac: Patent without evidence of aneurysm, dissection, vasculitis or significant stenosis. SMA: Patent without evidence of aneurysm, dissection, vasculitis or significant stenosis. Renals: Both renal arteries are patent without evidence of aneurysm, dissection, vasculitis, fibromuscular dysplasia or significant stenosis. IMA: Patent without evidence of aneurysm, dissection, vasculitis or significant stenosis. Inflow: Patent without evidence of aneurysm, dissection, vasculitis or significant stenosis. Veins: No obvious venous  abnormality within the limitations of this arterial phase study. Review of the MIP images confirms the above findings. NON-VASCULAR Hepatobiliary: No focal liver abnormality is seen. No gallstones, gallbladder wall thickening, or biliary dilatation. Pancreas: Unremarkable. No pancreatic ductal dilatation or surrounding inflammatory changes. Spleen: Normal in size without focal abnormality. Adrenals/Urinary Tract: Adrenal glands are unremarkable. Kidneys are normal, without renal calculi, focal lesion, or hydronephrosis. Bladder is unremarkable. Stomach/Bowel: The stomach appears normal. There is no evidence of bowel obstruction or inflammation. Status post appendectomy. Sigmoid diverticulosis is noted without inflammation. Lymphatic: No significant adenopathy is noted. Reproductive: Status post hysterectomy. No adnexal masses. Other: No abdominal wall hernia or abnormality. No abdominopelvic ascites. Musculoskeletal: No acute or significant osseous findings. Review of the MIP images confirms the above findings. IMPRESSION: No evidence of thoracic or abdominal aortic dissection or aneurysm. Mosaic pattern is noted throughout both lungs most consistent with small airways disease. Sigmoid diverticulosis is noted without inflammation. Aortic Atherosclerosis (ICD10-I70.0). Electronically Signed   By: Marijo Conception, M.D.   On: 02/19/2018 18:03    EKG: Independently reviewed.  A. Fib Old chart reviewed Case discussed with EDP Dr. Sabra Heck Chest x-ray reviewed no overt edema atelectasis at the left  Assessment/Plan 84 year old female with atypical chest pain Principal Problem:   Chest pain-CTA negative for PE or for any dissection.  Initial troponin negative.  EKG nonischemic.  Placed on aspirin.  Serial troponin overnight.  Obtain cardiac echo in the morning.  If everything negative consider outpatient stress testing for complete work-up.  Active Problems:   CAD (coronary artery disease)-aspirin   Essential  hypertension-clarify home meds   Ascending aorta dilatation (HCC)-appears to be stable on CT today   Type 2 diabetes mellitus with diabetic neuropathy, with long-term current use of insulin (HCC)-sliding scale insulin hold metformin   PUD (peptic ulcer disease)-no overt evidence of bleeding at this time.  She does have a history of peptic ulcer disease many years ago.     DVT prophylaxis: SCDs Code Status: Full Family Communication: None Disposition Plan: Tomorrow Consults called: None Admission status: Observation   Lynsay Fesperman A MD [facility]  If 7PM-7AM, please contact night-coverage [email or web address] Password [facility]  02/19/2018, 6:53 PM

## 2018-02-19 NOTE — ED Notes (Signed)
Patient transported to X-ray 

## 2018-02-19 NOTE — ED Provider Notes (Signed)
Change of shift, patient signed out by Dr. Jeanell Sparrow, awaiting CT angiogram to rule out dissection.  CT scan is negative, discussed with Dr. Shanon Brow, the patient will be admitted to the hospitalist service for chest pain evaluation.  Nitroglycerin paste placed.   Noemi Chapel, MD 02/19/18 [phone]

## 2018-02-19 NOTE — ED Triage Notes (Signed)
Pt in via [facility] from [facility] office today for mid  Radiating CP to the mid upper back that she describes as a dull ache onset today @ 9am with waking, pt denies SOB, denies v/d upon arrival ED, c/o nausea, A&O x4, pt take 325 mg ASA at home

## 2018-02-19 NOTE — ED Notes (Signed)
Pt to bathroom via wheelchair.

## 2018-02-20 ENCOUNTER — Observation Stay (HOSPITAL_COMMUNITY): Payer: Medicare Other

## 2018-02-20 DIAGNOSIS — I080 Rheumatic disorders of both mitral and aortic valves: Secondary | ICD-10-CM | POA: Diagnosis not present

## 2018-02-20 DIAGNOSIS — I259 Chronic ischemic heart disease, unspecified: Secondary | ICD-10-CM | POA: Diagnosis not present

## 2018-02-20 DIAGNOSIS — R079 Chest pain, unspecified: Secondary | ICD-10-CM | POA: Diagnosis not present

## 2018-02-20 DIAGNOSIS — I25119 Atherosclerotic heart disease of native coronary artery with unspecified angina pectoris: Secondary | ICD-10-CM

## 2018-02-20 DIAGNOSIS — I350 Nonrheumatic aortic (valve) stenosis: Secondary | ICD-10-CM

## 2018-02-20 LAB — GLUCOSE, CAPILLARY
GLUCOSE-CAPILLARY: 254 mg/dL — AB (ref 65–99)
Glucose-Capillary: 170 mg/dL — ABNORMAL HIGH (ref 65–99)
Glucose-Capillary: 205 mg/dL — ABNORMAL HIGH (ref 65–99)
Glucose-Capillary: 137 mg/dL — ABNORMAL HIGH (ref 65–99)

## 2018-02-20 LAB — ECHOCARDIOGRAM COMPLETE
Height: 59 in
WEIGHTICAEL: 2980.8 [oz_av]

## 2018-02-20 LAB — TROPONIN I: TROPONIN I: 0.03 ng/mL — AB (ref <0.03)

## 2018-02-20 MED ORDER — HEPARIN SODIUM (PORCINE) 5000 UNIT/ML IJ SOLN
5000.0000 [IU] | Freq: Three times a day (TID) | INTRAMUSCULAR | Status: DC
  Administered 2018-02-20 – 2018-02-21 (×3): 5000 [IU] via SUBCUTANEOUS
  Filled 2018-02-20 (×3): qty 1

## 2018-02-20 NOTE — Progress Notes (Signed)
Patient Demographics:    Cassidy [name], is a 84 y.o. female, DOB - 09-Feb-1927, PNT:[id number]  Admit date - 02/19/2018   Admitting Physician Phillips Grout, MD  Outpatient Primary MD for the patient is [name], MD  LOS - 0   Chief Complaint  Patient presents with  . Chest Pain        Subjective:    Cassidy [name] today has no fevers, no emesis,  No further no further episodes of chest pain     Assessment  & Plan :    Principal Problem:   Chest pain Active Problems:   CAD (coronary artery disease)   Essential hypertension   Ascending aorta dilatation (HCC)   Type 2 diabetes mellitus with diabetic neuropathy, with long-term current use of insulin (HCC)   PUD (peptic ulcer disease)  Brief Summary 84 y.o. female with medical history significant for asthma, A. fib, CAD,  hypertension admitted on 02/19/2018 with chest pains, found to have severe aortic stenosis and atrial fibrillation   1)Chest pain-patient admitted with atypical chest pains, CTA chest negative for PE, lower extremity Dopplers without DVT, no further chest pains at this time, however echocardiogram shows severe aortic stenosis, EF is preserved at 55 to 60% without regional wall motion abnormalities, will obtain cardiology consult.  Rule out for ACS by cardiac enzymes and EKG  2)PAFib--patient noted to have episodes of atrial fibrillation, CHADS-- =3 (HTN, age, and DM), discussed risk versus benefit of anticoagulation with patient daughter and husband, at this time patient wants to continue with aspirin 325 mg daily, she wants to hold off on full anticoagulation until she can discuss this further with her PCP post discharge  3)Severe Aortic Stenosis--- now presenting with chest pains, cardiology consult as above #1  4)H/o CAD--ruled out for ACS as above #1, continue aspirin 325 mg daily, Lipitor 10 mg daily, patient is currently  not on a beta-blocker, heart rate often dips into the 50s, so will not add beta-blockers at this time  5)HTN-stable, continue losartan/HCTZ  6)DM2--last A1c is 5.7, diet controlled, Use Novolog/Humalog Sliding scale insulin with Accu-Cheks/Fingersticks as ordered    Code Status : full   Disposition Plan  : home  Consults  :  cardiology   DVT Prophylaxis  :   Heparin  Lab Results  Component Value Date   PLT 318 02/19/2018    Inpatient Medications  Scheduled Meds: . ALPRAZolam  0.5-1 mg Oral QHS  . amitriptyline  10 mg Oral QHS  . aspirin EC  325 mg Oral Daily  . atorvastatin  10 mg Oral Daily  . budesonide  2 mL Nebulization BID  . donepezil  10 mg Oral QHS  . losartan  100 mg Oral Daily   And  . hydrochlorothiazide  12.5 mg Oral Daily  . insulin aspart  0-9 Units Subcutaneous TID WC  . loratadine  10 mg Oral Daily  . oxybutynin  10 mg Oral Q breakfast  . polyethylene glycol  17 g Oral Daily  . prednisoLONE acetate  1 drop Left Eye BID   Continuous Infusions: PRN Meds:.acetaminophen, albuterol, gi cocktail, morphine injection, ondansetron (ZOFRAN) IV    Anti-infectives (From admission, onward)   None  Objective:   Vitals:   02/20/18 0319 02/20/18 0359 02/20/18 0849 02/20/18 1150  BP:  (!) 164/64  (!) 115/51  Pulse:  61  (!) 57  Resp:    18  Temp:  97.7 F (36.5 C)  98.4 F (36.9 C)  TempSrc:  Oral  Oral  SpO2:  95% 94% 95%  Weight: 84.5 kg (186 lb 4.8 oz)     Height:        Wt Readings from Last 3 Encounters:  02/20/18 84.5 kg (186 lb 4.8 oz)  07/28/17 74 kg (163 lb 4 oz)  01/16/17 81.6 kg (180 lb)     Intake/Output Summary (Last 24 hours) at 02/20/2018 1654 Last data filed at 02/20/2018 0800 Gross per 24 hour  Intake -  Output 800 ml  Net -800 ml     Physical Exam  Gen:- Awake Alert,  In no apparent distress  HEENT:- [place].AT, No sclera icterus Neck-Supple Neck,No JVD,.  Lungs-  CTAB , good air movement CV- S1, S2 normal,  irregular heart rate 56, 3/6 SM Abd-  +ve B.Sounds, Abd Soft, No tenderness,    Extremity/Skin:- No  edema,   good pulses Psych-affect is appropriate, oriented x3 Neuro-no new focal deficits, no tremors   Data Review:   Micro Results No results found for this or any previous visit (from the past 240 hour(s)).  Radiology Reports Dg Chest 2 View  Result Date: 02/19/2018 CLINICAL DATA:  Chest pain EXAM: CHEST - 2 VIEW COMPARISON:  08/10/2017. FINDINGS: Cardiomegaly. Mild bilateral interstitial prominence. Mild left base subsegmental atelectasis. No pleural effusion or pneumothorax. IMPRESSION: 1. Cardiomegaly. Mild bilateral interstitial prominence. Mild CHF could present and this fashion. 2.  Mild left base subsegmental atelectasis/infiltrate. Electronically Signed   By: Marcello Moores  Register   On: 02/19/2018 13:41   Ct Angio Chest/abd/pel For Dissection W And/or Wo Contrast  Result Date: 02/19/2018 CLINICAL DATA:  Acute chest and back pain. EXAM: CT ANGIOGRAPHY CHEST, ABDOMEN AND PELVIS TECHNIQUE: Multidetector CT imaging through the chest, abdomen and pelvis was performed using the standard protocol during bolus administration of intravenous contrast. Multiplanar reconstructed images and MIPs were obtained and reviewed to evaluate the vascular anatomy. CONTRAST:  80 mL ISOVUE-370 IOPAMIDOL (ISOVUE-370) INJECTION 76% COMPARISON:  CT scan of August 30, 2004. FINDINGS: CTA CHEST FINDINGS Cardiovascular: Atherosclerosis of thoracic aorta is noted without aneurysm or dissection. No pericardial effusion is noted. Mild coronary artery calcifications are noted. Mediastinum/Nodes: No enlarged mediastinal, hilar, or axillary lymph nodes. Thyroid gland, trachea, and esophagus demonstrate no significant findings. Lungs/Pleura: No pneumothorax or pleural effusion is noted. Mosaic pattern is seen involving the lungs most consistent with small airways disease. Musculoskeletal: No chest wall abnormality. No acute or  significant osseous findings. Review of the MIP images confirms the above findings. CTA ABDOMEN AND PELVIS FINDINGS VASCULAR Aorta: Atherosclerosis of abdominal aorta is noted without aneurysm or dissection. Celiac: Patent without evidence of aneurysm, dissection, vasculitis or significant stenosis. SMA: Patent without evidence of aneurysm, dissection, vasculitis or significant stenosis. Renals: Both renal arteries are patent without evidence of aneurysm, dissection, vasculitis, fibromuscular dysplasia or significant stenosis. IMA: Patent without evidence of aneurysm, dissection, vasculitis or significant stenosis. Inflow: Patent without evidence of aneurysm, dissection, vasculitis or significant stenosis. Veins: No obvious venous abnormality within the limitations of this arterial phase study. Review of the MIP images confirms the above findings. NON-VASCULAR Hepatobiliary: No focal liver abnormality is seen. No gallstones, gallbladder wall thickening, or biliary dilatation. Pancreas: Unremarkable. No pancreatic ductal dilatation  or surrounding inflammatory changes. Spleen: Normal in size without focal abnormality. Adrenals/Urinary Tract: Adrenal glands are unremarkable. Kidneys are normal, without renal calculi, focal lesion, or hydronephrosis. Bladder is unremarkable. Stomach/Bowel: The stomach appears normal. There is no evidence of bowel obstruction or inflammation. Status post appendectomy. Sigmoid diverticulosis is noted without inflammation. Lymphatic: No significant adenopathy is noted. Reproductive: Status post hysterectomy. No adnexal masses. Other: No abdominal wall hernia or abnormality. No abdominopelvic ascites. Musculoskeletal: No acute or significant osseous findings. Review of the MIP images confirms the above findings. IMPRESSION: No evidence of thoracic or abdominal aortic dissection or aneurysm. Mosaic pattern is noted throughout both lungs most consistent with small airways disease. Sigmoid  diverticulosis is noted without inflammation. Aortic Atherosclerosis (ICD10-I70.0). Electronically Signed   By: Marijo Conception, M.D.   On: 02/19/2018 18:03     CBC Recent Labs  Lab 02/19/18 1351  WBC 9.1  HGB 12.0  HCT 39.2  PLT 318  MCV 82.2  MCH 25.2*  MCHC 30.6  RDW 17.1*  LYMPHSABS 2.2  MONOABS 0.8  EOSABS 0.7  BASOSABS 0.1    Chemistries  Recent Labs  Lab 02/19/18 1351  NA 137  K 4.4  CL 99*  CO2 31  GLUCOSE 131*  BUN 14  CREATININE 1.07*  CALCIUM 9.3  AST 20  ALT 11*  ALKPHOS 59  BILITOT 0.6   ------------------------------------------------------------------------------------------------------------------ No results for input(s): CHOL, HDL, LDLCALC, TRIG, CHOLHDL, LDLDIRECT in the last 72 hours.  Lab Results  Component Value Date   HGBA1C 5.9 (H) 07/23/2016   ------------------------------------------------------------------------------------------------------------------ No results for input(s): TSH, T4TOTAL, T3FREE, THYROIDAB in the last 72 hours.  Invalid input(s): FREET3 ------------------------------------------------------------------------------------------------------------------ No results for input(s): VITAMINB12, FOLATE, FERRITIN, TIBC, IRON, RETICCTPCT in the last 72 hours.  Coagulation profile No results for input(s): INR, PROTIME in the last 168 hours.  No results for input(s): DDIMER in the last 72 hours.  Cardiac Enzymes Recent Labs  Lab 02/19/18 2002 02/19/18 2141 02/20/18 0113  TROPONINI <0.03 <0.03 0.03*   ------------------------------------------------------------------------------------------------------------------    Component Value Date/Time   BNP 108.4 (H) 08/10/2017 0920     Roxan Hockey M.D on 02/20/2018 at 4:54 PM  Between 7am to 7pm - Pager - [phone]  After 7pm go to [email or web address] - password [email or web address]  [facility] -  Office  [phone]   Voice Recognition Viviann Spare dictation system was used  to create this note, attempts have been made to correct errors. Please contact the author with questions and/or clarifications.

## 2018-02-20 NOTE — Care Management Obs Status (Signed)
[place] NOTIFICATION   Patient Details  Name: Cassidy [name] MRN: [id number] Date of Birth: April 08, 1927   Medicare Observation Status Notification Given:  Yes    Carles Collet, RN 02/20/2018, 11:17 AM

## 2018-02-20 NOTE — Progress Notes (Signed)
  2D Echocardiogram has been performed.  Cassidy [name] 02/20/2018, 3:18 PM

## 2018-02-20 NOTE — Progress Notes (Signed)
Pt has switched from SR to Afib. HR is fluctuating between 50's and 60's. HR went down to 47 but was not sustained. Provider on-call has been notified via [facility] [place], waiting for response. Will continue to monitor.

## 2018-02-20 NOTE — Consult Note (Signed)
Cardiology Consultation:   Patient ID: Cassidy [name]; [id number]; [birth year]   Admit date: 02/19/2018 Date of Consult: 02/20/2018  Primary Care Provider: [name], MD Primary Cardiologist: Irish Lack  Patient Profile:   Cassidy [name] is a 84 y.o. female with a hx of CAD with prior PCI, aortic stenosis, aortic dilation, AF, HTN, DM, who is being seen today for the evaluation of aortic stenosis and chest pain at the request of Dr. Joesph Fillers.  History of Present Illness:   Cassidy [name] is a 84 y.o. female with a hx of CAD with prior PCI, aortic stenosis, aortic dilation, AF, HTN, DM, who is being seen today for the evaluation of aortic stenosis and chest pain.   The patient has multiple cardiac comorbidities including coronary disease and aortic stenosis for which she follows with Dr. Irish Lack. She was last seen in clinic in May 2018 at which time she reported limited activity but denied any cardiac complaints. Her last PCI was in 2001, and she had a normal nuclear stress test in 2009. Her last echocardiogram in 2014 showed mild to moderate AS. The decision was made to defer anticoagulation for her AF given her frequent falls.   She presented to the ED yesterday with chest pain. She reports that the pain came on that morning when walking and resolved spontaneously. She denied associated symptoms including dyspnea or other symptoms. ECG showed rate controlled AF and troponin peaked at 0.03. CTA showed no aortic dissection or aneurysm. She was admitted to the hospitalist service. She underwent echocardiogram today that showed normal EF with severe AS. Cardiology was consulted.  On my evaluation, the patient is resting comfortably in bed. Her history is limited, but she reports that she experienced chest pain yesterday when walking from her bedroom to the kitchen with her walker. The pain was a pressure sensation without any associated symptoms; it resolved spontaneously after about 15  minutes. She denies any other episodes of chest pain and denies any significant edema. She has had no syncope or presyncope. She has chronic LE edema. She denies other complaints.   Past Medical History:  Diagnosis Date  . Aortic stenosis    mild, echo, February, 2009  . Ascending aorta dilatation (HCC)    mild, echo, February, 2009  . Asthma   . Atrial fibrillation (HCC)    Coumadin is not used per the patient because of her eyes  . Blindness of left eye    related to some type of stroke in the past  . CAD (coronary artery disease)    stents right coronary artery 2001  /  nuclear, March, 2009, no ischemia  . Colon polyp   . Diverticulosis   . DM (diabetes mellitus) ([place])   . Dyslipidemia   . Ejection fraction    EF 70%, nuclear, 2009, normal, echo, 2009  . Falling episodes   . HTN (hypertension)   . Kidney stones   . Obesity   . PUD (peptic ulcer disease)   . UTI (urinary tract infection)   . Ventral hernia     Past Surgical History:  Procedure Laterality Date  . APPENDECTOMY    . ESOPHAGOGASTRODUODENOSCOPY N/A 11/07/2013   Procedure: ESOPHAGOGASTRODUODENOSCOPY (EGD);  Surgeon: Lafayette Dragon, MD;  Location: [facility];  Service: Endoscopy;  Laterality: N/A;  . TONSILLECTOMY    . TOTAL ABDOMINAL HYSTERECTOMY       Home Medications:  Prior to Admission medications   Medication Sig Start Date End Date Taking?  Authorizing Provider  acetaminophen (TYLENOL) 500 MG tablet Take 1,000 mg by mouth every 8 (eight) hours as needed (for pain).   Yes [provider]  ALPRAZolam Duanne Moron) 1 MG tablet Take 0.5-1 mg by mouth at bedtime.    Yes [provider]  amitriptyline (ELAVIL) 10 MG tablet Take 10 mg by mouth at bedtime.   Yes [provider]  aspirin EC 325 MG tablet Take 325 mg by mouth daily.   Yes [provider]  atorvastatin (LIPITOR) 10 MG tablet Take 1 tablet (10 mg total) by mouth daily. Patient taking differently: Take 10 mg by mouth  every Monday, Wednesday, and Friday.  01/16/17 02/19/18 Yes Jettie Booze, MD  Calcium Carb-Cholecalciferol (CALTRATE 600+D3 PO) Take 1 tablet by mouth daily with breakfast.   Yes [provider]  Cranberry-Vitamin C (AZO CRANBERRY URINARY TRACT) 250-60 MG CAPS Take 2 capsules by mouth daily with breakfast.   Yes [provider]  donepezil (ARICEPT) 10 MG tablet Take 10 mg by mouth at bedtime.    Yes [provider]  fluconazole (DIFLUCAN) 150 MG tablet Take 150 mg by mouth once as needed (for intermittent yeast infections).   Yes [provider]  insulin NPH-regular Human (NOVOLIN 70/30 RELION) (70-30) 100 UNIT/ML injection Inject 15-28 Units into the skin See admin instructions. Inject 28 units into the skin after breakfast and 15 units at bedtime   Yes [provider]  Lactobacillus Rhamnosus, GG, (CULTURELLE) CAPS Take 1 capsule by mouth daily.   Yes [provider]  loratadine (CLARITIN) 10 MG tablet Take 10 mg by mouth daily.   Yes [provider]  losartan-hydrochlorothiazide (HYZAAR) 100-12.5 MG tablet Take 1 tablet by mouth daily with breakfast. 06/25/16  Yes [provider]  metFORMIN (GLUCOPHAGE) 500 MG tablet Take 500 mg by mouth 2 (two) times daily with a meal.   Yes [provider]  nystatin cream (MYCOSTATIN) Apply 1 application topically 3 (three) times daily as needed (for vaginal yeast infections).  06/14/16  Yes [provider]  oxybutynin (DITROPAN-XL) 10 MG 24 hr tablet Take 10 mg by mouth daily with breakfast.  12/29/15  Yes [provider]  polyethylene glycol powder (GLYCOLAX/MIRALAX) powder Take 17 g by mouth daily.  05/21/17  Yes [provider]  prednisoLONE acetate (PRED FORTE) 1 % ophthalmic suspension Place 1 drop into the left eye See admin instructions. Instill 1 drop into the left eye four times a day for 7 days then decrease to 1 drop two times a day for  another 7 days then schedule a  follow up visit with MD 02/16/18 02/22/18 Yes [provider]  Psyllium (METAMUCIL FIBER PO) Take 10 g by mouth daily with breakfast.   Yes [provider]  albuterol (PROVENTIL) (2.5 MG/3ML) 0.083% nebulizer solution Take 3 mLs by nebulization every 4 (four) hours as needed for wheezing. 08/04/17   [provider]  budesonide (PULMICORT) 0.5 MG/2ML nebulizer solution Take 2 mLs by nebulization 2 (two) times daily. TO BE MIXED WITH ALBUTEROL 08/04/17   [provider]  predniSONE (DELTASONE) 20 MG tablet Take 1 tablet (20 mg total) by mouth 2 (two) times daily. Patient not taking: Reported on 02/19/2018 07/28/17   Daleen Bo, MD  sorbitol 70 % SOLN Take 20 mLs by mouth daily as needed for moderate constipation. Patient not taking: Reported on 02/19/2018 07/25/16   Nita Sells, MD    Inpatient Medications: Scheduled Meds: . ALPRAZolam  0.5-1 mg  Oral QHS  . amitriptyline  10 mg Oral QHS  . aspirin EC  325 mg Oral Daily  . atorvastatin  10 mg Oral Daily  . budesonide  2 mL Nebulization BID  . donepezil  10 mg Oral QHS  . heparin injection (subcutaneous)  5,000 Units Subcutaneous Q8H  . losartan  100 mg Oral Daily   And  . hydrochlorothiazide  12.5 mg Oral Daily  . insulin aspart  0-9 Units Subcutaneous TID WC  . loratadine  10 mg Oral Daily  . oxybutynin  10 mg Oral Q breakfast  . polyethylene glycol  17 g Oral Daily  . prednisoLONE acetate  1 drop Left Eye BID   Continuous Infusions:  PRN Meds: acetaminophen, albuterol, gi cocktail, morphine injection, ondansetron (ZOFRAN) IV  Allergies:    Allergies  Allergen Reactions  . Latex Rash    Social History:   Social History   Socioeconomic History  . Marital status: Married    Spouse name: Not on file  . Number of children: 2  . Years of education: Not on file  . Highest education level: Not on file  Occupational History  . Occupation: retired     Fish farm manager: RETIRED  Social Needs  . Financial resource strain: Not on file  . Food insecurity:    Worry: Not on file    Inability: Not on file  . Transportation needs:    Medical: Not on file    Non-medical: Not on file  Tobacco Use  . Smoking status: Never Smoker  . Smokeless tobacco: Never Used  Substance and Sexual Activity  . Alcohol use: No  . Drug use: No  . Sexual activity: Yes  Lifestyle  . Physical activity:    Days per week: Not on file    Minutes per session: Not on file  . Stress: Not on file  Relationships  . Social connections:    Talks on phone: Not on file    Gets together: Not on file    Attends religious service: Not on file    Active member of club or organization: Not on file    Attends meetings of clubs or organizations: Not on file    Relationship status: Not on file  . Intimate partner violence:    Fear of current or ex partner: Not on file    Emotionally abused: Not on file    Physically abused: Not on file    Forced sexual activity: Not on file  Other Topics Concern  . Not on file  Social History Narrative  . Not on file    Family History:    Family History  Problem Relation Age of Onset  . Colon cancer Mother   . Heart attack Father        59  . Diabetes Sister   . Diabetes Daughter        x 2  . Hypertension Neg Hx   . Stroke Neg Hx      ROS:  Please see the history of present illness.  All other ROS reviewed and negative.     Physical Exam/Data:   Vitals:   02/20/18 0359 02/20/18 0849 02/20/18 1150 02/20/18 2023  BP: (!) 164/64  (!) 115/51 (!) 147/50  Pulse: 61  (!) 57 80  Resp:   18 18  Temp: 97.7 F (36.5 C)  98.4 F (36.9 C)   TempSrc: Oral  Oral   SpO2: 95% 94% 95% 93%  Weight:  Height:        Intake/Output Summary (Last 24 hours) at 02/20/2018 2022 Last data filed at 02/20/2018 1700 Gross per 24 hour  Intake 360 ml  Output 1100 ml  Net -740 ml   Filed Weights   02/19/18 2150 02/19/18 2218 02/20/18 0319    Weight: 85.3 kg (188 lb 1.6 oz) 85.3 kg (188 lb 1.6 oz) 84.5 kg (186 lb 4.8 oz)   Body mass index is 37.63 kg/m.  General:  Well nourished, well developed, in no acute distress  HEENT: normal Neck: no JVD Vascular: No carotid bruits; FA pulses 2+ bilaterally without bruits  Cardiac:  Grossly regular with III/VI systolic murmur with radiation to carotids and diminished S2. Lungs:  clear to auscultation bilaterally, no wheezing, rhonchi or rales  Abd: soft, nontender, no hepatomegaly  Ext: Some minimally pitting LE edema with some skin breakdown Musculoskeletal:  No deformities, BUE and BLE strength normal and equal Skin: warm and dry  Neuro:  No focal abnormalities noted Psych:  Normal affect   EKG:  The EKG was personally reviewed and demonstrates:  Findings as described above Telemetry:  Telemetry was personally reviewed and demonstrates:  Rate controlled AF  Relevant CV Studies: TTE 02/20/2018: - Left ventricle: The cavity size was normal. Wall thickness was   increased in a pattern of severe LVH. Systolic function was   normal. The estimated ejection fraction was in the range of 55%   to 60%. Wall motion was normal; there were no regional wall   motion abnormalities. - Aortic valve: Valve mobility was severely restricted.   Transvalvular velocity was increased. There was severe stenosis.   There was mild regurgitation. Valve area (VTI): 1.06 cm^2. Valve   area (Vmax): 1.02 cm^2. Valve area (Vmean): 1.1 cm^2. - Aortic root: The aortic root was mildly dilated. - Mitral valve: Mildly calcified annulus. There was mild   regurgitation. - Left atrium: The atrium was moderately dilated. - Pulmonary arteries: Systolic pressure was mildly to moderately   increased. PA peak pressure: 41 mm Hg (S).   TTE 11/2012: - Left ventricle: The cavity size was normal. Wall thickness was increased in a pattern of moderate LVH. The estimated ejection fraction was 60%. Wall motion was  normal; there were no regional wall motion abnormalities. - Aortic valve: Calcified valve. Mild/moderate AS. Mild regurgitation. - Left atrium: The atrium was mildly dilated. - Right ventricle: Systolic function was mildly reduced. Transthoracic echocardiography. M-mode, complete 2D, spectral Doppler, and color Doppler. Height: Height: 157.5cm. Height: 62in. Weight: Weight: 86.6kg. Weight: 190.6lb. Body mass index: BMI: 34.9kg/m^2. Body surface area:  BSA: 1.26m^2. Blood pressure:   124/76. Patient status: Outpatient. Location: [facility]  Laboratory Data:  Chemistry Recent Labs  Lab 02/19/18 1351  NA 137  K 4.4  CL 99*  CO2 31  GLUCOSE 131*  BUN 14  CREATININE 1.07*  CALCIUM 9.3  GFRNONAA 45*  GFRAA 52*  ANIONGAP 7    Recent Labs  Lab 02/19/18 1351  PROT 6.1*  ALBUMIN 3.1*  AST 20  ALT 11*  ALKPHOS 59  BILITOT 0.6   Hematology Recent Labs  Lab 02/19/18 1351  WBC 9.1  RBC 4.77  HGB 12.0  HCT 39.2  MCV 82.2  MCH 25.2*  MCHC 30.6  RDW 17.1*  PLT 318   Cardiac Enzymes Recent Labs  Lab 02/19/18 2002 02/19/18 2141 02/20/18 0113  TROPONINI <0.03 <0.03 0.03*    Recent Labs  Lab 02/19/18 1353  TROPIPOC 0.01  BNPNo results for input(s): BNP, PROBNP in the last 168 hours.  DDimer No results for input(s): DDIMER in the last 168 hours.  Radiology/Studies:  Dg Chest 2 View  Result Date: 02/19/2018 CLINICAL DATA:  Chest pain EXAM: CHEST - 2 VIEW COMPARISON:  08/10/2017. FINDINGS: Cardiomegaly. Mild bilateral interstitial prominence. Mild left base subsegmental atelectasis. No pleural effusion or pneumothorax. IMPRESSION: 1. Cardiomegaly. Mild bilateral interstitial prominence. Mild CHF could present and this fashion. 2.  Mild left base subsegmental atelectasis/infiltrate. Electronically Signed   By: Marcello Moores  Register   On: 02/19/2018 13:41   Ct Angio Chest/abd/pel For Dissection W And/or Wo Contrast  Result Date:  02/19/2018 CLINICAL DATA:  Acute chest and back pain. EXAM: CT ANGIOGRAPHY CHEST, ABDOMEN AND PELVIS TECHNIQUE: Multidetector CT imaging through the chest, abdomen and pelvis was performed using the standard protocol during bolus administration of intravenous contrast. Multiplanar reconstructed images and MIPs were obtained and reviewed to evaluate the vascular anatomy. CONTRAST:  80 mL ISOVUE-370 IOPAMIDOL (ISOVUE-370) INJECTION 76% COMPARISON:  CT scan of August 30, 2004. FINDINGS: CTA CHEST FINDINGS Cardiovascular: Atherosclerosis of thoracic aorta is noted without aneurysm or dissection. No pericardial effusion is noted. Mild coronary artery calcifications are noted. Mediastinum/Nodes: No enlarged mediastinal, hilar, or axillary lymph nodes. Thyroid gland, trachea, and esophagus demonstrate no significant findings. Lungs/Pleura: No pneumothorax or pleural effusion is noted. Mosaic pattern is seen involving the lungs most consistent with small airways disease. Musculoskeletal: No chest wall abnormality. No acute or significant osseous findings. Review of the MIP images confirms the above findings. CTA ABDOMEN AND PELVIS FINDINGS VASCULAR Aorta: Atherosclerosis of abdominal aorta is noted without aneurysm or dissection. Celiac: Patent without evidence of aneurysm, dissection, vasculitis or significant stenosis. SMA: Patent without evidence of aneurysm, dissection, vasculitis or significant stenosis. Renals: Both renal arteries are patent without evidence of aneurysm, dissection, vasculitis, fibromuscular dysplasia or significant stenosis. IMA: Patent without evidence of aneurysm, dissection, vasculitis or significant stenosis. Inflow: Patent without evidence of aneurysm, dissection, vasculitis or significant stenosis. Veins: No obvious venous abnormality within the limitations of this arterial phase study. Review of the MIP images confirms the above findings. NON-VASCULAR Hepatobiliary: No focal liver  abnormality is seen. No gallstones, gallbladder wall thickening, or biliary dilatation. Pancreas: Unremarkable. No pancreatic ductal dilatation or surrounding inflammatory changes. Spleen: Normal in size without focal abnormality. Adrenals/Urinary Tract: Adrenal glands are unremarkable. Kidneys are normal, without renal calculi, focal lesion, or hydronephrosis. Bladder is unremarkable. Stomach/Bowel: The stomach appears normal. There is no evidence of bowel obstruction or inflammation. Status post appendectomy. Sigmoid diverticulosis is noted without inflammation. Lymphatic: No significant adenopathy is noted. Reproductive: Status post hysterectomy. No adnexal masses. Other: No abdominal wall hernia or abnormality. No abdominopelvic ascites. Musculoskeletal: No acute or significant osseous findings. Review of the MIP images confirms the above findings. IMPRESSION: No evidence of thoracic or abdominal aortic dissection or aneurysm. Mosaic pattern is noted throughout both lungs most consistent with small airways disease. Sigmoid diverticulosis is noted without inflammation. Aortic Atherosclerosis (ICD10-I70.0). Electronically Signed   By: Marijo Conception, M.D.   On: 02/19/2018 18:03    Assessment and Plan:   Chest pain Aortic Stenosis CAD with prior PCI The patient presented to the ED after a single episode of exertional chest pain that resolved with rest. Her troponin has been minimally elevated and ECG is additionally reassuring against acute MI. Given her history, CAD mediated angina is a possible cause of her pain, although she denies any other episodes of chest pain. Echocardiogram  today shows progression of AS which is now severe by indexed AVA and peak velocity, although gradient is 31 mmHg. It is unclear if her chest pain may be due to symptomatic severe AS; she denies associated dyspnea or presyncope. She may benefit from consideration of valve replacement, although better characterization of her  symptoms will be important. -Continue to monitor symptoms -Will probably benefit from outpatient evaluation for AVR (most likely TAVR given her age and comorbidities).  -Outpatient ischemic evaluation is reasonable -Continue ASA, atorvastatin   AF Currently rate controlled without nodal blockade -Not currently on anticoagulation given history of falls  HLD -Continue atorvastatin    For questions or updates, please contact [facility] Please consult [email or web address] for contact info under Cardiology/STEMI.   Signed, Nila Nephew, MD  02/20/2018 8:22 PM

## 2018-02-20 NOTE — Progress Notes (Signed)
Pt's 02 sat dropped in the 80's. Pt was put on 2L02 but was sating 91 to 93%. 02 was increased to 3L. Pt is currently sating between 93 to 95%. Will continue monitoring.

## 2018-02-20 NOTE — Progress Notes (Signed)
CRITICAL VALUE ALERT  Critical Value:  Troponin 0.03  Date & Time Notied:  02/20/18 at 0228  Provider Notified: Via Amion  Orders Received/Actions taken: Waiting for provider's response

## 2018-02-21 ENCOUNTER — Other Ambulatory Visit: Payer: Self-pay | Admitting: Cardiology

## 2018-02-21 DIAGNOSIS — I259 Chronic ischemic heart disease, unspecified: Secondary | ICD-10-CM | POA: Diagnosis not present

## 2018-02-21 DIAGNOSIS — I25118 Atherosclerotic heart disease of native coronary artery with other forms of angina pectoris: Secondary | ICD-10-CM | POA: Diagnosis not present

## 2018-02-21 DIAGNOSIS — I10 Essential (primary) hypertension: Secondary | ICD-10-CM

## 2018-02-21 DIAGNOSIS — R079 Chest pain, unspecified: Secondary | ICD-10-CM

## 2018-02-21 DIAGNOSIS — I080 Rheumatic disorders of both mitral and aortic valves: Secondary | ICD-10-CM | POA: Diagnosis not present

## 2018-02-21 DIAGNOSIS — I77810 Thoracic aortic ectasia: Secondary | ICD-10-CM | POA: Diagnosis not present

## 2018-02-21 DIAGNOSIS — I350 Nonrheumatic aortic (valve) stenosis: Secondary | ICD-10-CM | POA: Diagnosis not present

## 2018-02-21 LAB — GLUCOSE, CAPILLARY
Glucose-Capillary: 215 mg/dL — ABNORMAL HIGH (ref 65–99)
Glucose-Capillary: 185 mg/dL — ABNORMAL HIGH (ref 65–99)
Glucose-Capillary: 156 mg/dL — ABNORMAL HIGH (ref 65–99)

## 2018-02-21 MED ORDER — ASPIRIN EC 325 MG PO TBEC: 325.0000 mg | DELAYED_RELEASE_TABLET | Freq: Every day | ORAL | 0 refills | Status: AC

## 2018-02-21 MED ORDER — AMITRIPTYLINE HCL 10 MG PO TABS: 10.0000 mg | ORAL_TABLET | Freq: Every evening | ORAL | 1 refills | Status: AC | PRN

## 2018-02-21 NOTE — Plan of Care (Signed)

## 2018-02-21 NOTE — Discharge Instructions (Signed)
1) the cardiology clinic (Dr Hassell Done office) will call you to arrange/set up nuclear stress test as an outpatient 2) You will need to follow-up with your primary cardiologist Dr. Irish Lack to discuss the results of the stress test and also to discuss further work-up and possible treatment for your narrow aortic valve --- follow-up should occur within the next week or so.. Please call your cardiologist office if you don not get a call by Wednesday, 02/24/2018 3)Avoid  strenuous activity until you are seen by your cardiologist 4) continue medications as previously advised with no changes at this time 5) continue to be compliant with your low-salt diet 6)Avoid ibuprofen/Advil/Aleve/Motrin/Goody Powders/Naproxen/BC powders/Meloxicam/Diclofenac/Indomethacin and other Nonsteroidal anti-inflammatory medications as these will make you more likely to bleed and can cause stomach ulcers, can also cause Kidney problems.

## 2018-02-21 NOTE — Discharge Summary (Signed)
Cassidy [name], is a 84 y.o. female  DOB August 29, 1927  MRN [id number].  Admission date:  02/19/2018  Admitting Physician  Cassidy Grout, MD  Discharge Date:  02/21/2018   Primary MD  Cassidy Noon, MD  Recommendations for primary care physician for things to follow:   1) the cardiology clinic (Cassidy [name]) will call you to arrange/set up nuclear stress test as an outpatient 2) You will need to follow-up with your primary cardiologist Cassidy [name] to discuss the results of the stress test and also to discuss further work-up and possible treatment for your narrow aortic valve --- follow-up should occur within the next week or so.. Please call your cardiologist [name] if you don not get a call by Wednesday, 02/24/2018 3)Avoid  strenuous activity until you are seen by your cardiologist 4) continue medications as previously advised with no changes at this time 5) continue to be compliant with your low-salt diet 6)Avoid ibuprofen/Advil/Aleve/Motrin/Goody Powders/Naproxen/BC powders/Meloxicam/Diclofenac/Indomethacin and other Nonsteroidal anti-inflammatory medications as these will make you more likely to bleed and can cause stomach ulcers, can also cause Kidney problems.   Admission Diagnosis  Chest pain, unspecified type [R07.9]   Discharge Diagnosis  Chest pain, unspecified type [R07.9]    Principal Problem:   Chest pain Active Problems:   CAD (coronary artery disease)   Essential hypertension   Ascending aorta dilatation (HCC)   Type 2 diabetes mellitus with diabetic neuropathy, with long-term current use of insulin (HCC)   PUD (peptic ulcer disease)      Past Medical History:  Diagnosis Date  . Aortic stenosis    mild, echo, February, 2009  . Ascending aorta dilatation (HCC)    mild, echo, February, 2009  . Asthma   . Atrial fibrillation (HCC)    Coumadin is not used per the patient  because of her eyes  . Blindness of left eye    related to some type of stroke in the past  . CAD (coronary artery disease)    stents right coronary artery 2001  /  nuclear, March, 2009, no ischemia  . Colon polyp   . Diverticulosis   . DM (diabetes mellitus) ([place])   . Dyslipidemia   . Ejection fraction    EF 70%, nuclear, 2009, normal, echo, 2009  . Falling episodes   . HTN (hypertension)   . Kidney stones   . Obesity   . PUD (peptic ulcer disease)   . UTI (urinary tract infection)   . Ventral hernia     Past Surgical History:  Procedure Laterality Date  . APPENDECTOMY    . ESOPHAGOGASTRODUODENOSCOPY N/A 11/07/2013   Procedure: ESOPHAGOGASTRODUODENOSCOPY (EGD);  Surgeon: Cassidy Dragon, MD;  Location: [facility];  Service: Endoscopy;  Laterality: N/A;  . TONSILLECTOMY    . TOTAL ABDOMINAL HYSTERECTOMY         HPI  from the history and physical [name] on the day of admission:   Chief Complaint: Chest pain  HPI: Cassidy [name] is a 84 y.o.  female with medical history significant of aortic dilation, asthma, A. fib, coronary artery disease, hypertension comes in with substernal chest pain that occurred earlier today that lasted over an hour that spontaneously resolved.  Patient does not recall what relieved her pain or made it worse.  She denies any fevers or cough.  She denies any shortness of breath.  Her pain has been relieved now for several hours.  She denies any lower extremity edema or swelling.  Patient is being referred for admission for rule out ACS due to her multiple risk factors.    Hospital Course:     Brief Summary 84 y.o.femalewith medical history significant for asthma, A. fib, CAD,  hypertension admitted on 02/19/2018 with chest pains, found to have severe aortic stenosis and atrial fibrillation  Plan 1)Chest pain- patient admitted with atypical chest pains, CTA chest negative for PE, lower extremity Dopplers without DVT, no further chest pains since  admission, however echocardiogram shows severe aortic stenosis, EF is preserved at 55 to 60% without regional wall motion abnormalities, cardiology consult appreciated. Pt already Ruled out for ACS by cardiac enzymes and EKG. cardiology recommends outpatient nuclear stress test for further cardiovascular risk stratification. D/w Cassidy Cassidy [name] prior to discharge on 02/21/18  2)PAFib--patient noted to have episodes of atrial fibrillation, CHADS-- =3 (HTN, age, and DM), discussed risk versus benefit of anticoagulation with patient,  daughter and husband.  Apparently in the past patient was not on full anticoagulation due to concerns about falls, patient's daughter, husband and patient sister states that she had only one near fall episode last 12 months , however patient is to have outpatient nuclear stress test for possible aortic valve repair (TAVR), given these considerations FULL anticoagulation will not be initiated at this time at this time,   continue with aspirin 325 mg daily for now.  Rate is well controlled despite not being on any AV nodal blocking agents  3)Severe Aortic Stenosis--- now presenting with chest pains, chest pains may not be related to Aortic stenosis,  cardiology consult appreciated, patient will have further outpatient work-up and possible TAVR   4)H/o CAD--ruled out for ACS already as above #1, continue aspirin 325 mg daily, Lipitor 10 mg daily, patient is currently not on a beta-blocker, heart rate often dips into the 50s, so will not add beta-blockers at this time  5)HTN-stable, continue losartan/HCTZ  6)DM2--last A1c is 5.7, diet controlled,    Code Status : full   Disposition Plan  : home with Family..... Plan of care including follow-up plan discussed with patient's daughter Cassidy [name], patient's husband and patient herself, patient is somewhat forgetful  Consults  :  Cardiology  Discharge Condition: stable  Follow UP--- Cassidy Cassidy [name] (Cardiology)   Diet and  Activity recommendation:  As advised  Discharge Instructions    Discharge Instructions    Call MD for:  difficulty breathing, headache or visual disturbances   Complete by:  As directed    Call MD for:  persistant dizziness or light-headedness   Complete by:  As directed    Call MD for:  persistant nausea and vomiting   Complete by:  As directed    Call MD for:  severe uncontrolled pain   Complete by:  As directed    Call MD for:  temperature >100.4   Complete by:  As directed    Diet - low sodium heart healthy   Complete by:  As directed    Discharge instructions   Complete by:  As directed  1) the cardiology clinic (Cassidy [name]) will call you to arrange/set up nuclear stress test as an outpatient 2) You will need to follow-up with your primary cardiologist Cassidy [name] to discuss the results of the stress test and also to discuss further work-up and possible treatment for your narrow aortic valve --- follow-up should occur within the next week or so.. Please call your cardiologist [name] if you don not get a call by Wednesday, 02/24/2018 3)Avoid  strenuous activity until you are seen by your cardiologist 4) continue medications as previously advised with no changes at this time 5) continue to be compliant with your low-salt diet 6)Avoid ibuprofen/Advil/Aleve/Motrin/Goody Powders/Naproxen/BC powders/Meloxicam/Diclofenac/Indomethacin and other Nonsteroidal anti-inflammatory medications as these will make you more likely to bleed and can cause stomach ulcers, can also cause Kidney problems.   Increase activity slowly   Complete by:  As directed    Avoid Strenous Activity until seen by Cardiologist       Discharge Medications     Allergies as of 02/21/2018      Reactions   Latex Rash      Medication List    STOP taking these medications   predniSONE 20 MG tablet Commonly known as:  DELTASONE     TAKE these medications   acetaminophen 500 MG tablet Commonly known  as:  TYLENOL Take 1,000 mg by mouth every 8 (eight) hours as needed (for pain).   albuterol (2.5 MG/3ML) 0.083% nebulizer solution Commonly known as:  PROVENTIL Take 3 mLs by nebulization every 4 (four) hours as needed for wheezing.   ALPRAZolam 1 MG tablet Commonly known as:  XANAX Take 0.5-1 mg by mouth at bedtime.   amitriptyline 10 MG tablet Commonly known as:  ELAVIL Take 1 tablet (10 mg total) by mouth at bedtime as needed for sleep. What changed:    when to take this  reasons to take this   aspirin EC 325 MG tablet Take 1 tablet (325 mg total) by mouth daily. With Food What changed:  additional instructions   atorvastatin 10 MG tablet Commonly known as:  LIPITOR Take 1 tablet (10 mg total) by mouth daily. What changed:  when to take this   [place] 250-60 MG Caps Generic drug:  Cranberry-Vitamin C Take 2 capsules by mouth daily with breakfast.   budesonide 0.5 MG/2ML nebulizer solution Commonly known as:  PULMICORT Take 2 mLs by nebulization 2 (two) times daily. TO BE MIXED WITH ALBUTEROL   CALTRATE 600+D3 PO Take 1 tablet by mouth daily with breakfast.   CULTURELLE Caps Take 1 capsule by mouth daily.   donepezil 10 MG tablet Commonly known as:  ARICEPT Take 10 mg by mouth at bedtime.   fluconazole 150 MG tablet Commonly known as:  DIFLUCAN Take 150 mg by mouth once as needed (for intermittent yeast infections).   loratadine 10 MG tablet Commonly known as:  CLARITIN Take 10 mg by mouth daily.   losartan-hydrochlorothiazide 100-12.5 MG tablet Commonly known as:  HYZAAR Take 1 tablet by mouth daily with breakfast.   METAMUCIL FIBER PO Take 10 g by mouth daily with breakfast.   metFORMIN 500 MG tablet Commonly known as:  GLUCOPHAGE Take 500 mg by mouth 2 (two) times daily with a meal.   NOVOLIN 70/30 RELION (70-30) 100 UNIT/ML injection Generic drug:  insulin NPH-regular Human Inject 15-28 Units into the skin See admin  instructions. Inject 28 units into the skin after breakfast and 15 units at bedtime  nystatin cream Commonly known as:  MYCOSTATIN Apply 1 application topically 3 (three) times daily as needed (for vaginal yeast infections).   oxybutynin 10 MG 24 hr tablet Commonly known as:  DITROPAN-XL Take 10 mg by mouth daily with breakfast.   polyethylene glycol powder powder Commonly known as:  GLYCOLAX/MIRALAX Take 17 g by mouth daily.   prednisoLONE acetate 1 % ophthalmic suspension Commonly known as:  PRED FORTE Place 1 drop into the left eye See admin instructions. Instill 1 drop into the left eye four times a day for 7 days then decrease to 1 drop two times a day for another 7 days then schedule a  follow up visit with MD   sorbitol 70 % Soln Take 20 mLs by mouth daily as needed for moderate constipation.       Major procedures and Radiology Reports - PLEASE review detailed and final reports for all details, in brief   Dg Chest 2 View  Result Date: 02/19/2018 CLINICAL DATA:  Chest pain EXAM: CHEST - 2 VIEW COMPARISON:  08/10/2017. FINDINGS: Cardiomegaly. Mild bilateral interstitial prominence. Mild left base subsegmental atelectasis. No pleural effusion or pneumothorax. IMPRESSION: 1. Cardiomegaly. Mild bilateral interstitial prominence. Mild CHF could present and this fashion. 2.  Mild left base subsegmental atelectasis/infiltrate. Electronically Signed   By: Marcello Moores  Register   On: 02/19/2018 13:41   Ct Angio Chest/abd/pel For Dissection W And/or Wo Contrast  Result Date: 02/19/2018 CLINICAL DATA:  Acute chest and back pain. EXAM: CT ANGIOGRAPHY CHEST, ABDOMEN AND PELVIS TECHNIQUE: Multidetector CT imaging through the chest, abdomen and pelvis was performed using the standard protocol during bolus administration of intravenous contrast. Multiplanar reconstructed images and MIPs were obtained and reviewed to evaluate the vascular anatomy. CONTRAST:  80 mL ISOVUE-370 IOPAMIDOL (ISOVUE-370)  INJECTION 76% COMPARISON:  CT scan of August 30, 2004. FINDINGS: CTA CHEST FINDINGS Cardiovascular: Atherosclerosis of thoracic aorta is noted without aneurysm or dissection. No pericardial effusion is noted. Mild coronary artery calcifications are noted. Mediastinum/Nodes: No enlarged mediastinal, hilar, or axillary lymph nodes. Thyroid gland, trachea, and esophagus demonstrate no significant findings. Lungs/Pleura: No pneumothorax or pleural effusion is noted. Mosaic pattern is seen involving the lungs most consistent with small airways disease. Musculoskeletal: No chest wall abnormality. No acute or significant osseous findings. Review of the MIP images confirms the above findings. CTA ABDOMEN AND PELVIS FINDINGS VASCULAR Aorta: Atherosclerosis of abdominal aorta is noted without aneurysm or dissection. Celiac: Patent without evidence of aneurysm, dissection, vasculitis or significant stenosis. SMA: Patent without evidence of aneurysm, dissection, vasculitis or significant stenosis. Renals: Both renal arteries are patent without evidence of aneurysm, dissection, vasculitis, fibromuscular dysplasia or significant stenosis. IMA: Patent without evidence of aneurysm, dissection, vasculitis or significant stenosis. Inflow: Patent without evidence of aneurysm, dissection, vasculitis or significant stenosis. Veins: No obvious venous abnormality within the limitations of this arterial phase study. Review of the MIP images confirms the above findings. NON-VASCULAR Hepatobiliary: No focal liver abnormality is seen. No gallstones, gallbladder wall thickening, or biliary dilatation. Pancreas: Unremarkable. No pancreatic ductal dilatation or surrounding inflammatory changes. Spleen: Normal in size without focal abnormality. Adrenals/Urinary Tract: Adrenal glands are unremarkable. Kidneys are normal, without renal calculi, focal lesion, or hydronephrosis. Bladder is unremarkable. Stomach/Bowel: The stomach appears normal.  There is no evidence of bowel obstruction or inflammation. Status post appendectomy. Sigmoid diverticulosis is noted without inflammation. Lymphatic: No significant adenopathy is noted. Reproductive: Status post hysterectomy. No adnexal masses. Other: No abdominal wall hernia or abnormality. No abdominopelvic ascites.  Musculoskeletal: No acute or significant osseous findings. Review of the MIP images confirms the above findings. IMPRESSION: No evidence of thoracic or abdominal aortic dissection or aneurysm. Mosaic pattern is noted throughout both lungs most consistent with small airways disease. Sigmoid diverticulosis is noted without inflammation. Aortic Atherosclerosis (ICD10-I70.0). Electronically Signed   By: Marijo Conception, M.D.   On: 02/19/2018 18:03    Micro Results    No results found for this or any previous visit (from the past 240 hour(s)).     Today   Subjective    Shireen Quan today has no new concerns, no further chest pains, no shortness of breath at rest, no dyspnea on exertion no dizziness no palpitations patient feels fine          Patient has been seen and examined prior to discharge   Objective   Blood pressure (!) 154/87, pulse 68, temperature 98.5 F (36.9 C), temperature source Oral, resp. rate 16, height 4\' 11"  (1.499 m), weight 84.7 kg (186 lb 12.8 oz), SpO2 90 %.   Intake/Output Summary (Last 24 hours) at 02/21/2018 1207 Last data filed at 02/21/2018 0800 Gross per 24 hour  Intake 360 ml  Output 1350 ml  Net -990 ml    Exam Gen:- Awake Alert,  In no apparent distress  HEENT:- [place].AT, No sclera icterus Neck-Supple Neck,No JVD,.  Lungs-  CTAB , good air movement CV- S1, S2 normal, irregular heart rate 64, 3/6 SM Abd-  +ve B.Sounds, Abd Soft, No tenderness,    Extremity/Skin:- No  edema,   good pulses Psych-affect is appropriate, oriented x3 Neuro-no new focal deficits, no tremors     Data Review   CBC w Diff:  Lab Results  Component Value Date    WBC 9.1 02/19/2018   HGB 12.0 02/19/2018   HCT 39.2 02/19/2018   PLT 318 02/19/2018   LYMPHOPCT 24 02/19/2018   MONOPCT 9 02/19/2018   EOSPCT 8 02/19/2018   BASOPCT 1 02/19/2018    CMP:  Lab Results  Component Value Date   NA 137 02/19/2018   K 4.4 02/19/2018   CL 99 (L) 02/19/2018   CO2 31 02/19/2018   BUN 14 02/19/2018   CREATININE 1.07 (H) 02/19/2018   CREATININE 1.06 (H) 02/07/2016   PROT 6.1 (L) 02/19/2018   ALBUMIN 3.1 (L) 02/19/2018   BILITOT 0.6 02/19/2018   ALKPHOS 59 02/19/2018   AST 20 02/19/2018   ALT 11 (L) 02/19/2018  .   Total Discharge time is about 33 minutes  Roxan Hockey M.D on 02/21/2018 at 12:07 PM  [facility]   [name]  [phone]  Voice Recognition Viviann Spare dictation system was used to create this note, attempts have been made to correct errors. Please contact the author with questions and/or clarifications.

## 2018-02-21 NOTE — Progress Notes (Signed)
Progress Note  Patient Name: Cassidy [name] Date of Encounter: 02/21/2018  Primary Cardiologist: Irish Lack  Subjective   No further episodes of chest pain.  Denies dyspnea.  Lying fully supine in bed without any complaints or difficulty.  No arrhythmia on telemetry other than well rate controlled atrial fibrillation  Inpatient Medications    Scheduled Meds: . ALPRAZolam  0.5-1 mg Oral QHS  . amitriptyline  10 mg Oral QHS  . aspirin EC  325 mg Oral Daily  . atorvastatin  10 mg Oral Daily  . budesonide  2 mL Nebulization BID  . donepezil  10 mg Oral QHS  . heparin injection (subcutaneous)  5,000 Units Subcutaneous Q8H  . losartan  100 mg Oral Daily   And  . hydrochlorothiazide  12.5 mg Oral Daily  . insulin aspart  0-9 Units Subcutaneous TID WC  . loratadine  10 mg Oral Daily  . oxybutynin  10 mg Oral Q breakfast  . polyethylene glycol  17 g Oral Daily  . prednisoLONE acetate  1 drop Left Eye BID   Continuous Infusions:  PRN Meds: acetaminophen, albuterol, gi cocktail, morphine injection, ondansetron (ZOFRAN) IV   Vital Signs    Vitals:   02/20/18 2023 02/20/18 2110 02/21/18 0458 02/21/18 0856  BP: (!) 147/50  (!) 154/87   Pulse: 80  78 68  Resp: 18  20 16   Temp: 98.3 F (36.8 C)  98.5 F (36.9 C)   TempSrc: Oral  Oral   SpO2: 93% 92% 97% 90%  Weight:   186 lb 12.8 oz (84.7 kg)   Height:        Intake/Output Summary (Last 24 hours) at 02/21/2018 1106 Last data filed at 02/21/2018 0800 Gross per 24 hour  Intake 360 ml  Output 1350 ml  Net -990 ml   Filed Weights   02/19/18 2218 02/20/18 0319 02/21/18 0458  Weight: 188 lb 1.6 oz (85.3 kg) 186 lb 4.8 oz (84.5 kg) 186 lb 12.8 oz (84.7 kg)    Telemetry    AFib, rate controlled - Personally Reviewed  ECG    AFib, T wave inversion I and aVL (old) - Personally Reviewed  Physical Exam  Appears comfortable, mildly obese GEN: No acute distress.   Neck: No JVD, bilateral faint carotid bruits without  delay Cardiac:  Irregular, 2/6 mid peaking aortic systolic ejection murmur radiating to the carotids, no diastolic murmurs, rubs, or gallops.  Respiratory: Clear to auscultation bilaterally. GI: Soft, nontender, non-distended  MS: No edema; No deformity. Neuro:  Nonfocal  Psych: Normal affect   Labs    Chemistry Recent Labs  Lab 02/19/18 1351  NA 137  K 4.4  CL 99*  CO2 31  GLUCOSE 131*  BUN 14  CREATININE 1.07*  CALCIUM 9.3  PROT 6.1*  ALBUMIN 3.1*  AST 20  ALT 11*  ALKPHOS 59  BILITOT 0.6  GFRNONAA 45*  GFRAA 52*  ANIONGAP 7     Hematology Recent Labs  Lab 02/19/18 1351  WBC 9.1  RBC 4.77  HGB 12.0  HCT 39.2  MCV 82.2  MCH 25.2*  MCHC 30.6  RDW 17.1*  PLT 318    Cardiac Enzymes Recent Labs  Lab 02/19/18 2002 02/19/18 2141 02/20/18 0113  TROPONINI <0.03 <0.03 0.03*    Recent Labs  Lab 02/19/18 1353  TROPIPOC 0.01     BNPNo results for input(s): BNP, PROBNP in the last 168 hours.   DDimer No results for input(s): DDIMER in the last  168 hours.   Radiology    Dg Chest 2 View  Result Date: 02/19/2018 CLINICAL DATA:  Chest pain EXAM: CHEST - 2 VIEW COMPARISON:  08/10/2017. FINDINGS: Cardiomegaly. Mild bilateral interstitial prominence. Mild left base subsegmental atelectasis. No pleural effusion or pneumothorax. IMPRESSION: 1. Cardiomegaly. Mild bilateral interstitial prominence. Mild CHF could present and this fashion. 2.  Mild left base subsegmental atelectasis/infiltrate. Electronically Signed   By: Marcello Moores  Register   On: 02/19/2018 13:41   Ct Angio Chest/abd/pel For Dissection W And/or Wo Contrast  Result Date: 02/19/2018 CLINICAL DATA:  Acute chest and back pain. EXAM: CT ANGIOGRAPHY CHEST, ABDOMEN AND PELVIS TECHNIQUE: Multidetector CT imaging through the chest, abdomen and pelvis was performed using the standard protocol during bolus administration of intravenous contrast. Multiplanar reconstructed images and MIPs were obtained and reviewed  to evaluate the vascular anatomy. CONTRAST:  80 mL ISOVUE-370 IOPAMIDOL (ISOVUE-370) INJECTION 76% COMPARISON:  CT scan of August 30, 2004. FINDINGS: CTA CHEST FINDINGS Cardiovascular: Atherosclerosis of thoracic aorta is noted without aneurysm or dissection. No pericardial effusion is noted. Mild coronary artery calcifications are noted. Mediastinum/Nodes: No enlarged mediastinal, hilar, or axillary lymph nodes. Thyroid gland, trachea, and esophagus demonstrate no significant findings. Lungs/Pleura: No pneumothorax or pleural effusion is noted. Mosaic pattern is seen involving the lungs most consistent with small airways disease. Musculoskeletal: No chest wall abnormality. No acute or significant osseous findings. Review of the MIP images confirms the above findings. CTA ABDOMEN AND PELVIS FINDINGS VASCULAR Aorta: Atherosclerosis of abdominal aorta is noted without aneurysm or dissection. Celiac: Patent without evidence of aneurysm, dissection, vasculitis or significant stenosis. SMA: Patent without evidence of aneurysm, dissection, vasculitis or significant stenosis. Renals: Both renal arteries are patent without evidence of aneurysm, dissection, vasculitis, fibromuscular dysplasia or significant stenosis. IMA: Patent without evidence of aneurysm, dissection, vasculitis or significant stenosis. Inflow: Patent without evidence of aneurysm, dissection, vasculitis or significant stenosis. Veins: No obvious venous abnormality within the limitations of this arterial phase study. Review of the MIP images confirms the above findings. NON-VASCULAR Hepatobiliary: No focal liver abnormality is seen. No gallstones, gallbladder wall thickening, or biliary dilatation. Pancreas: Unremarkable. No pancreatic ductal dilatation or surrounding inflammatory changes. Spleen: Normal in size without focal abnormality. Adrenals/Urinary Tract: Adrenal glands are unremarkable. Kidneys are normal, without renal calculi, focal lesion, or  hydronephrosis. Bladder is unremarkable. Stomach/Bowel: The stomach appears normal. There is no evidence of bowel obstruction or inflammation. Status post appendectomy. Sigmoid diverticulosis is noted without inflammation. Lymphatic: No significant adenopathy is noted. Reproductive: Status post hysterectomy. No adnexal masses. Other: No abdominal wall hernia or abnormality. No abdominopelvic ascites. Musculoskeletal: No acute or significant osseous findings. Review of the MIP images confirms the above findings. IMPRESSION: No evidence of thoracic or abdominal aortic dissection or aneurysm. Mosaic pattern is noted throughout both lungs most consistent with small airways disease. Sigmoid diverticulosis is noted without inflammation. Aortic Atherosclerosis (ICD10-I70.0). Electronically Signed   By: Marijo Conception, M.D.   On: 02/19/2018 18:03    Cardiac Studies   - Left ventricle: The cavity size was normal. Wall thickness was   increased in a pattern of severe LVH. Systolic function was   normal. The estimated ejection fraction was in the range of 55%   to 60%. Wall motion was normal; there were no regional wall   motion abnormalities. - Aortic valve: Valve mobility was severely restricted.   Transvalvular velocity was increased. There was severe stenosis.   There was mild regurgitation. Valve area (VTI): 1.06 cm^2.  Valve   area (Vmax): 1.02 cm^2. Valve area (Vmean): 1.1 cm^2. - Aortic root: The aortic root was mildly dilated. - Mitral valve: Mildly calcified annulus. There was mild   regurgitation. - Left atrium: The atrium was moderately dilated. - Pulmonary arteries: Systolic pressure was mildly to moderately   increased. PA peak pressure: 41 mm Hg (S).  Aortic valve:   Trileaflet; normal thickness, moderately calcified leaflets. Valve mobility was severely restricted.  Doppler: Transvalvular velocity was increased. There was severe stenosis. There was mild regurgitation.    VTI ratio of  LVOT to aortic valve: 0.42. Valve area (VTI): 1.06 cm^2. Indexed valve area (VTI): 0.55 cm^2/m^2. Peak velocity ratio of LVOT to aortic valve: 0.4. Valve area (Vmax): 1.02 cm^2. Indexed valve area (Vmax): 0.53 cm^2/m^2. Mean velocity ratio of LVOT to aortic valve: 0.43. Valve area (Vmean): 1.1 cm^2. Indexed valve area (Vmean): 0.57 cm^2/m^2. Mean gradient (S): 31 mm Hg. Peak gradient (S): 69 mm Hg.   Patient Profile     84 y.o. female with severe aortic stenosis and known coronary artery disease, permanent atrial fibrillation, hypertension, diabetes mellitus presenting after a single episode of chest discomfort at rest with low risk ECG and biomarkers.  Assessment & Plan    1. Chest pain: Description is possibly consistent with angina pectoris, but the symptoms have resolved and she does not have any high risk biochemical or ECG markers.  The patient believes that it happened while she was lying in bed, although she told the consulting cardiologist that it occurred while she was ambulating in the house.  I think she is appropriate for outpatient work-up.  We will schedule for [facility].  If this is abnormal she should have coronary angiography. 2. AS: Conflicting findings on echo.  On my review the valve is extremely thickened and moderately calcified with marked restriction in leaflet excursion.  By 2D criteria it looks like severe aortic stenosis.  On the other hand the jet is early to mid peaking and the gradients are only moderately elevated.  I do not think the aortic valve area calculation is reliable since there is extreme variability in aortic valve velocities and LVOT velocities in the setting of atrial fibrillation.  I think she has severe aortic stenosis.  Regardless of classification, it is highly likely she will require aortic valve replacement in the next 6-12 months based on the morphology of the valve alone.  I do not think she is a good candidate for surgical aortic valve  replacement and she would refuse this procedure anyway.  She is however a good candidate for TAVR, pending review of vascular access suitability. 3. AFib: Dr. Lenell Antu note from May 2018 states that the patient has declined anticoagulation, but other data from the family suggests that anticoagulation was stopped due to falls.  Dr. Hassell Done note in 2018 stated that she had fallen 3 times in the previous year, but the family reports only one fall in the last 12 months.  I would not push the issue of anticoagulation at this point since it is possible will soon have to consider cardiac catheterization and TAVR.  However, she does not appear to be at a bit of risk for anticoagulation.  We will arrange Lexiscan Myoview next week and follow-up with Dr. Irish Lack (she is due for her yearly appointment).  For questions or updates, please contact [place] Please consult [email or web address] for contact info under Cardiology/STEMI.      Signed, Sanda Klein, MD  02/21/2018, 11:06 AM

## 2018-02-21 NOTE — Progress Notes (Signed)
Discharged patient home with daughter.  Discussed medication and follow up appointments with both.  Encourage purse - lip breathing when pt gets SOB.   Pulse ox- 92% after activity; sat pt down and did purse lip breathing and O2 went up to 96 %.

## 2018-03-01 ENCOUNTER — Other Ambulatory Visit: Payer: Self-pay | Admitting: Interventional Cardiology

## 2018-03-02 ENCOUNTER — Telehealth (HOSPITAL_COMMUNITY): Payer: Self-pay

## 2018-03-02 NOTE — Telephone Encounter (Signed)
Encounter complete. 

## 2018-03-03 ENCOUNTER — Emergency Department (HOSPITAL_COMMUNITY): Payer: Medicare Other

## 2018-03-03 ENCOUNTER — Encounter (HOSPITAL_COMMUNITY): Payer: Self-pay | Admitting: *Deleted

## 2018-03-03 ENCOUNTER — Inpatient Hospital Stay (HOSPITAL_COMMUNITY)
Admission: EM | Admit: 2018-03-03 | Discharge: 2018-03-11 | DRG: 287 | Disposition: A | Discharge status: Hospice | Payer: Medicare Other | Attending: Cardiology | Admitting: Cardiology

## 2018-03-03 ENCOUNTER — Telehealth (HOSPITAL_COMMUNITY): Payer: Self-pay

## 2018-03-03 DIAGNOSIS — Z6839 Body mass index (BMI) 39.0-39.9, adult: Secondary | ICD-10-CM

## 2018-03-03 DIAGNOSIS — Z8249 Family history of ischemic heart disease and other diseases of the circulatory system: Secondary | ICD-10-CM

## 2018-03-03 DIAGNOSIS — I69398 Other sequelae of cerebral infarction: Secondary | ICD-10-CM

## 2018-03-03 DIAGNOSIS — I482 Chronic atrial fibrillation: Secondary | ICD-10-CM | POA: Diagnosis not present

## 2018-03-03 DIAGNOSIS — N183 Chronic kidney disease, stage 3 unspecified: Secondary | ICD-10-CM

## 2018-03-03 DIAGNOSIS — R5381 Other malaise: Secondary | ICD-10-CM | POA: Diagnosis present

## 2018-03-03 DIAGNOSIS — I700 Atherosclerosis of aorta: Secondary | ICD-10-CM | POA: Diagnosis present

## 2018-03-03 DIAGNOSIS — I259 Chronic ischemic heart disease, unspecified: Secondary | ICD-10-CM

## 2018-03-03 DIAGNOSIS — M25511 Pain in right shoulder: Secondary | ICD-10-CM | POA: Diagnosis not present

## 2018-03-03 DIAGNOSIS — Z794 Long term (current) use of insulin: Secondary | ICD-10-CM

## 2018-03-03 DIAGNOSIS — I2510 Atherosclerotic heart disease of native coronary artery without angina pectoris: Secondary | ICD-10-CM | POA: Diagnosis present

## 2018-03-03 DIAGNOSIS — H5462 Unqualified visual loss, left eye, normal vision right eye: Secondary | ICD-10-CM | POA: Diagnosis present

## 2018-03-03 DIAGNOSIS — R600 Localized edema: Secondary | ICD-10-CM

## 2018-03-03 DIAGNOSIS — Z66 Do not resuscitate: Secondary | ICD-10-CM | POA: Diagnosis present

## 2018-03-03 DIAGNOSIS — I350 Nonrheumatic aortic (valve) stenosis: Secondary | ICD-10-CM | POA: Diagnosis not present

## 2018-03-03 DIAGNOSIS — Z515 Encounter for palliative care: Secondary | ICD-10-CM | POA: Diagnosis not present

## 2018-03-03 DIAGNOSIS — I25118 Atherosclerotic heart disease of native coronary artery with other forms of angina pectoris: Secondary | ICD-10-CM | POA: Diagnosis not present

## 2018-03-03 DIAGNOSIS — Z9181 History of falling: Secondary | ICD-10-CM

## 2018-03-03 DIAGNOSIS — I10 Essential (primary) hypertension: Secondary | ICD-10-CM | POA: Diagnosis present

## 2018-03-03 DIAGNOSIS — E669 Obesity, unspecified: Secondary | ICD-10-CM

## 2018-03-03 DIAGNOSIS — Z79899 Other long term (current) drug therapy: Secondary | ICD-10-CM

## 2018-03-03 DIAGNOSIS — W07XXXA Fall from chair, initial encounter: Secondary | ICD-10-CM | POA: Diagnosis not present

## 2018-03-03 DIAGNOSIS — Z955 Presence of coronary angioplasty implant and graft: Secondary | ICD-10-CM

## 2018-03-03 DIAGNOSIS — I252 Old myocardial infarction: Secondary | ICD-10-CM

## 2018-03-03 DIAGNOSIS — F0390 Unspecified dementia without behavioral disturbance: Secondary | ICD-10-CM | POA: Diagnosis present

## 2018-03-03 DIAGNOSIS — R296 Repeated falls: Secondary | ICD-10-CM | POA: Diagnosis present

## 2018-03-03 DIAGNOSIS — E1122 Type 2 diabetes mellitus with diabetic chronic kidney disease: Secondary | ICD-10-CM | POA: Diagnosis present

## 2018-03-03 DIAGNOSIS — I25119 Atherosclerotic heart disease of native coronary artery with unspecified angina pectoris: Secondary | ICD-10-CM | POA: Diagnosis present

## 2018-03-03 DIAGNOSIS — M549 Dorsalgia, unspecified: Secondary | ICD-10-CM

## 2018-03-03 DIAGNOSIS — I272 Pulmonary hypertension, unspecified: Secondary | ICD-10-CM | POA: Diagnosis present

## 2018-03-03 DIAGNOSIS — Z7982 Long term (current) use of aspirin: Secondary | ICD-10-CM

## 2018-03-03 DIAGNOSIS — Z7189 Other specified counseling: Secondary | ICD-10-CM

## 2018-03-03 DIAGNOSIS — E1169 Type 2 diabetes mellitus with other specified complication: Secondary | ICD-10-CM

## 2018-03-03 DIAGNOSIS — I129 Hypertensive chronic kidney disease with stage 1 through stage 4 chronic kidney disease, or unspecified chronic kidney disease: Secondary | ICD-10-CM | POA: Diagnosis present

## 2018-03-03 DIAGNOSIS — M25551 Pain in right hip: Secondary | ICD-10-CM | POA: Diagnosis not present

## 2018-03-03 DIAGNOSIS — R0789 Other chest pain: Secondary | ICD-10-CM

## 2018-03-03 DIAGNOSIS — E11649 Type 2 diabetes mellitus with hypoglycemia without coma: Secondary | ICD-10-CM | POA: Diagnosis present

## 2018-03-03 DIAGNOSIS — E785 Hyperlipidemia, unspecified: Secondary | ICD-10-CM | POA: Diagnosis present

## 2018-03-03 DIAGNOSIS — I77810 Thoracic aortic ectasia: Secondary | ICD-10-CM | POA: Diagnosis present

## 2018-03-03 DIAGNOSIS — I4821 Permanent atrial fibrillation: Secondary | ICD-10-CM | POA: Diagnosis present

## 2018-03-03 DIAGNOSIS — J45909 Unspecified asthma, uncomplicated: Secondary | ICD-10-CM | POA: Diagnosis present

## 2018-03-03 DIAGNOSIS — Y92239 Unspecified place in hospital as the place of occurrence of the external cause: Secondary | ICD-10-CM | POA: Diagnosis not present

## 2018-03-03 DIAGNOSIS — Z91040 Latex allergy status: Secondary | ICD-10-CM

## 2018-03-03 HISTORY — DX: Other persistent atrial fibrillation: I48.19

## 2018-03-03 HISTORY — DX: Unspecified dementia, unspecified severity, without behavioral disturbance, psychotic disturbance, mood disturbance, and anxiety: F03.90

## 2018-03-03 HISTORY — DX: Chronic kidney disease, stage 3 unspecified: N18.30

## 2018-03-03 HISTORY — DX: Nonrheumatic aortic (valve) stenosis: I35.0

## 2018-03-03 HISTORY — DX: Chronic kidney disease, stage 3 (moderate): N18.3

## 2018-03-03 LAB — PROTIME-INR
INR: 1
Prothrombin Time: 13.1 seconds (ref 11.4–15.2)

## 2018-03-03 LAB — CBC WITH DIFFERENTIAL/PLATELET
Abs Immature Granulocytes: 0 10*3/uL (ref 0.0–0.1)
BASOS ABS: 0.1 10*3/uL (ref 0.0–0.1)
Basophils Relative: 1 %
EOS ABS: 0.4 10*3/uL (ref 0.0–0.7)
Eosinophils Relative: 5 %
HCT: 41.3 % (ref 36.0–46.0)
Hemoglobin: 12.4 g/dL (ref 12.0–15.0)
IMMATURE GRANULOCYTES: 1 %
Lymphocytes Relative: 15 %
Lymphs Abs: 1.3 10*3/uL (ref 0.7–4.0)
MCH: 25.1 pg — ABNORMAL LOW (ref 26.0–34.0)
MCHC: 30 g/dL (ref 30.0–36.0)
MCV: 83.4 fL (ref 78.0–100.0)
Monocytes Absolute: 1 10*3/uL (ref 0.1–1.0)
Monocytes Relative: 11 %
NEUTROS ABS: 5.8 10*3/uL (ref 1.7–7.7)
NEUTROS PCT: 67 %
Platelets: 346 10*3/uL (ref 150–400)
RBC: 4.95 MIL/uL (ref 3.87–5.11)
RDW: 17.7 % — AB (ref 11.5–15.5)
WBC: 8.7 10*3/uL (ref 4.0–10.5)

## 2018-03-03 LAB — COMPREHENSIVE METABOLIC PANEL
ALT: 12 U/L (ref 0–44)
AST: 27 U/L (ref 15–41)
Albumin: 3.3 g/dL — ABNORMAL LOW (ref 3.5–5.0)
Alkaline Phosphatase: 56 U/L (ref 38–126)
Anion gap: 11 (ref 5–15)
BUN: 20 mg/dL (ref 8–23)
CHLORIDE: 98 mmol/L (ref 98–111)
CO2: 31 mmol/L (ref 22–32)
Calcium: 10 mg/dL (ref 8.9–10.3)
Creatinine, Ser: 1.26 mg/dL — ABNORMAL HIGH (ref 0.44–1.00)
GFR, EST AFRICAN AMERICAN: 42 mL/min — AB (ref >60)
GFR, EST NON AFRICAN AMERICAN: 37 mL/min — AB (ref >60)
Glucose, Bld: 124 mg/dL — ABNORMAL HIGH (ref 70–99)
POTASSIUM: 4.3 mmol/L (ref 3.5–5.1)
Sodium: 140 mmol/L (ref 135–145)
Total Bilirubin: 0.7 mg/dL (ref 0.3–1.2)
Total Protein: 6.6 g/dL (ref 6.5–8.1)

## 2018-03-03 LAB — I-STAT TROPONIN, ED: TROPONIN I, POC: 0.02 ng/mL (ref 0.00–0.08)

## 2018-03-03 LAB — CBG MONITORING, ED: Glucose-Capillary: 61 mg/dL — ABNORMAL LOW (ref 70–99)

## 2018-03-03 LAB — TROPONIN I: Troponin I: 0.03 ng/mL (ref <0.03)

## 2018-03-03 LAB — MAGNESIUM: MAGNESIUM: 1.9 mg/dL (ref 1.7–2.4)

## 2018-03-03 LAB — BRAIN NATRIURETIC PEPTIDE: B Natriuretic Peptide: 110 pg/mL — ABNORMAL HIGH (ref 0.0–100.0)

## 2018-03-03 MED ORDER — ASPIRIN 81 MG PO CHEW: 324.0000 mg | CHEWABLE_TABLET | Freq: Once | ORAL | Status: AC

## 2018-03-03 NOTE — ED Triage Notes (Signed)
Pt here via [facility] from home.  Pt was sitting in car and began experiencing dull ache in center of chest that radiates to center of back.  Hx NSTEMI.  Denies nv or diaphoresis.  Pt refused nitro, but did take 324 asa.  VS 162/88, hr 80, O2 95%.  Pt felt as if her blood sugar was bottoming out.  Initial cbg was 52 and increased to 82 after peanut crackers and a fig newton.  Pt refused an IV.

## 2018-03-03 NOTE — ED Notes (Signed)
Pt went to restroom had to be taken in wheelchair gait not to steady.

## 2018-03-03 NOTE — ED Notes (Signed)
Gave pt orange juice, nurse notified.

## 2018-03-03 NOTE — ED Provider Notes (Signed)
[facility] EMERGENCY DEPARTMENT Provider Note   CSN: [id number] Arrival date & time: 03/03/18  1502     History   Chief Complaint Chief Complaint  Patient presents with  . Chest Pain    HPI KEYSHAWNA [name] is a 84 y.o. female.  HPI Patient presents with concern of chest pain, resolved prior to my evaluation. She notes that she was just finishing her hair appointment, about 3 hours ago when she developed pain, pressure-like, throughout the anterior thorax. No clear interventions performed, but the pain is resolved entirely. EMS notes the patient denied pain for most of transport, was hemodynamically unremarkable. Patient herself acknowledges multiple medical issues including aortic stenosis, CAD. She states, however, that she has been doing generally well until today.  Past Medical History:  Diagnosis Date  . Ascending aorta dilatation (HCC)    mild, echo, February, 2009  . Asthma   . Blindness of left eye    related to some type of stroke in the past  . CAD (coronary artery disease)    a. stents right coronary artery 2001.   . CKD (chronic kidney disease), stage III ([place])   . Colon polyp   . Diverticulosis   . DM (diabetes mellitus) ([place])   . Dyslipidemia   . Falling episodes   . HTN (hypertension)   . Kidney stones   . Obesity   . Persistent atrial fibrillation (HCC)    a. pt declined anticoag. also hx of falls.  . PUD (peptic ulcer disease)   . Severe aortic stenosis   . UTI (urinary tract infection)   . Ventral hernia     Patient Active Problem List   Diagnosis Date Noted  . Chest pain 02/19/2018  . PUD (peptic ulcer disease) 02/19/2018  . Sepsis ([place]) 07/22/2016  . UTI (urinary tract infection) 07/22/2016  . Duodenal ulcer 11/07/2013  . Gastric ulcer with hemorrhage 11/07/2013  . Upper GI bleed 11/06/2013  . Unspecified constipation 09/19/2013  . Family history of malignant neoplasm of gastrointestinal tract 09/19/2013  . Preop  cardiovascular exam 09/11/2011  . CAD (coronary artery disease)   . Permanent atrial fibrillation ([place])   . Essential hypertension   . Diverticulosis   . Colon polyp   . Aortic stenosis   . Ascending aorta dilatation (HCC)   . Asthma   . Obesity   . Dyslipidemia   . Ejection fraction   . Type 2 diabetes mellitus with diabetic neuropathy, with long-term current use of insulin ([place])   . Falling episodes     Past Surgical History:  Procedure Laterality Date  . APPENDECTOMY    . ESOPHAGOGASTRODUODENOSCOPY N/A 11/07/2013   Procedure: ESOPHAGOGASTRODUODENOSCOPY (EGD);  Surgeon: Lafayette Dragon, MD;  Location: [facility];  Service: Endoscopy;  Laterality: N/A;  . TONSILLECTOMY    . TOTAL ABDOMINAL HYSTERECTOMY       OB History   None      Home Medications    Prior to Admission medications   Medication Sig Start Date End Date Taking? Authorizing Provider  acetaminophen (TYLENOL) 500 MG tablet Take 1,000 mg by mouth every 8 (eight) hours as needed (for pain).   Yes [provider]  albuterol (PROVENTIL) (2.5 MG/3ML) 0.083% nebulizer solution Take 3 mLs by nebulization every 4 (four) hours as needed for wheezing. 08/04/17  Yes [provider]  ALPRAZolam Duanne Moron) 1 MG tablet Take 1 mg by mouth at bedtime.    Yes [provider]  amitriptyline (ELAVIL) 10  MG tablet Take 1 tablet (10 mg total) by mouth at bedtime as needed for sleep. 02/21/18  Yes Emokpae, Courage, MD  aspirin EC 325 MG tablet Take 1 tablet (325 mg total) by mouth daily. With Food 02/21/18  Yes Roxan Hockey, MD  atorvastatin (LIPITOR) 10 MG tablet Take 1 tablet (10 mg total) by mouth every Monday, Wednesday, and Friday. 03/01/18  Yes Jettie Booze, MD  Calcium Carb-Cholecalciferol (CALTRATE 600+D3 PO) Take 1 tablet by mouth daily with breakfast.   Yes [provider]  Cranberry-Vitamin C (AZO CRANBERRY URINARY TRACT) 250-60 MG CAPS Take 2 capsules by mouth daily with breakfast.    Yes [provider]  donepezil (ARICEPT) 10 MG tablet Take 10 mg by mouth at bedtime.    Yes [provider]  fluconazole (DIFLUCAN) 150 MG tablet Take 150 mg by mouth once as needed (for intermittent yeast infections).   Yes [provider]  insulin NPH-regular Human (NOVOLIN 70/30 RELION) (70-30) 100 UNIT/ML injection Inject 15-28 Units into the skin See admin instructions. Inject 28 units into the skin after breakfast and 15 units at bedtime   Yes [provider]  Lactobacillus Rhamnosus, GG, (CULTURELLE) CAPS Take 1 capsule by mouth daily.   Yes [provider]  loratadine (CLARITIN) 10 MG tablet Take 10 mg by mouth daily.   Yes [provider]  losartan-hydrochlorothiazide (HYZAAR) 100-12.5 MG tablet Take 1 tablet by mouth daily with breakfast. 06/25/16  Yes [provider]  metFORMIN (GLUCOPHAGE) 500 MG tablet Take 500 mg by mouth 2 (two) times daily with a meal.   Yes [provider]  nystatin cream (MYCOSTATIN) Apply 1 application topically 3 (three) times daily as needed (for vaginal yeast infections).  06/14/16  Yes [provider]  oxybutynin (DITROPAN-XL) 10 MG 24 hr tablet Take 10 mg by mouth daily with breakfast.  12/29/15  Yes [provider]  Psyllium (METAMUCIL FIBER PO) Take 10 g by mouth daily with breakfast.   Yes [provider]  sorbitol 70 % SOLN Take 20 mLs by mouth daily as needed for moderate constipation. 07/25/16  Yes Nita Sells, MD    Family History Family History  Problem Relation Age of Onset  . Colon cancer Mother   . Heart attack Father        69  . Diabetes Sister   . Diabetes Daughter        x 2  . Hypertension Neg Hx   . Stroke Neg Hx     Social History Social History   Tobacco Use  . Smoking status: Never Smoker  . Smokeless tobacco: Never Used  Substance Use Topics  . Alcohol use: No  . Drug use: No     Allergies   Latex   Review  of Systems Review of Systems  Constitutional:       Per HPI, otherwise negative  HENT:       Per HPI, otherwise negative  Respiratory:       Per HPI, otherwise negative  Cardiovascular:       Per HPI, otherwise negative  Gastrointestinal: Negative for vomiting.  Endocrine:       Negative aside from HPI  Genitourinary:       Neg aside from HPI   Musculoskeletal:       Per HPI, otherwise negative  Skin: Negative.   Neurological: Negative for syncope.     Physical Exam Updated Vital Signs BP 124/64   Pulse (!) 58  Temp 97.6 F (36.4 C) (Oral)   Resp 15   SpO2 100%   Physical Exam  Constitutional: She is oriented to person, place, and time. She appears well-developed and well-nourished. No distress.  HENT:  Head: Normocephalic and atraumatic.  Eyes: Conjunctivae and EOM are normal.  Cardiovascular: Normal rate and regular rhythm.  Pulmonary/Chest: Effort normal and breath sounds normal. No stridor. No respiratory distress.  Abdominal: She exhibits no distension.  Musculoskeletal: She exhibits no edema.  Neurological: She is alert and oriented to person, place, and time. No cranial nerve deficit.  Skin: Skin is warm and dry.  Psychiatric: She has a normal mood and affect.  Nursing note and vitals reviewed.    ED Treatments / Results  Labs (all labs ordered are listed, but only abnormal results are displayed) Labs Reviewed  COMPREHENSIVE METABOLIC PANEL - Abnormal; Notable for the following components:      Result Value   Glucose, Bld 124 (*)    Creatinine, Ser 1.26 (*)    Albumin 3.3 (*)    GFR calc non Af Amer 37 (*)    GFR calc Af Amer 42 (*)    All other components within normal limits  TROPONIN I - Abnormal; Notable for the following components:   Troponin I 0.03 (*)    All other components within normal limits  BRAIN NATRIURETIC PEPTIDE - Abnormal; Notable for the following components:   B Natriuretic Peptide 110.0 (*)    All other components within  normal limits  CBC WITH DIFFERENTIAL/PLATELET - Abnormal; Notable for the following components:   MCH 25.1 (*)    RDW 17.7 (*)    All other components within normal limits  CBG MONITORING, ED - Abnormal; Notable for the following components:   Glucose-Capillary 61 (*)    All other components within normal limits  MAGNESIUM  PROTIME-INR  I-STAT TROPONIN, ED    EKG EKG Interpretation  Date/Time:  Wednesday March 03 2018 15:09:06 EDT Ventricular Rate:  59 PR Interval:    QRS Duration: 74 QT Interval:  416 QTC Calculation: 411 R Axis:   65 Text Interpretation:  narrow complex bradycardia w dropped beat and artefact Abnormal ekg Confirmed by Carmin Muskrat [phone]) on 03/03/2018 3:13:39 PM   Radiology Dg Chest Portable 1 View  Result Date: 03/03/2018 CLINICAL DATA:  Acute chest pain. EXAM: PORTABLE CHEST 1 VIEW COMPARISON:  02/19/2018 FINDINGS: The cardiomediastinal silhouette is unremarkable. Interstitial prominence again noted. There is no evidence of focal airspace disease, pulmonary edema, suspicious pulmonary nodule/mass, pleural effusion, or pneumothorax. No acute bony abnormalities are identified. Remote RIGHT humeral fracture again noted. IMPRESSION: No acute abnormality. Electronically Signed   By: Margarette Canada M.D.   On: 03/03/2018 16:21    Procedures Procedures (including critical care time)  Medications Ordered in ED Medications  aspirin chewable tablet 324 mg (324 mg Oral Given by EMS 03/03/18 1500)     Initial Impression / Assessment and Plan / ED Course  I have reviewed the triage vital signs and the nursing notes.  Pertinent labs & imaging results that were available during my care of the patient were reviewed by me and considered in my medical decision making (see chart for details).    Review after initial visit notable for ED evaluation 1 week ago for chest pain, with CT angiography results as below IMPRESSION: No evidence of thoracic or abdominal aortic  dissection or aneurysm.   Mosaic pattern is noted throughout both lungs most consistent with small airways  disease.   Sigmoid diverticulosis is noted without inflammation.   Aortic Atherosclerosis (ICD10-I70.0).     Electronically Signed   By: Marijo Conception, M.D.   On: 02/19/2018 18:03  8:05 PM On repeat exam the patient's pain remains absent.  Not she is now accompanied by her daughter. Daughter notes the patient is scheduled for nuclear medicine stress test in 48 hours. We discussed today's presentation, as well as a presentation last week for similar pain. Subsequently discussed the patient's case with our cardiology colleagues, and given her recurrent chest pain episodes, planned diagnostic session in 48 hours she will be admitted for further evaluation and management.  This elderly female presents with recurrent chest pain. Patient had recent evaluation, similar to today's, both reassuring, but given the recurrent episodes, concern for unstable angina, she was admitted for further evaluation and management.   Final Clinical Impressions(s) / ED Diagnoses  Atypical chest pain   Carmin Muskrat, MD 03/03/18 2006

## 2018-03-03 NOTE — Telephone Encounter (Signed)
Encounter complete. 

## 2018-03-03 NOTE — H&P (Addendum)
Cardiology History & Physical    Patient ID: [name] MRN: [id number], DOB: 02-02-1927 Date of Encounter: 03/03/2018, 7:41 PM Primary Physician: [name], MD Primary Cardiologist: Larae Grooms, MD  Chief Complaint: chest pain Reason for Admission: chest pain, severe AS, elevated troponin Requesting MD: Dr. Vanita Panda  HPI: Cassidy [name] is a 84 y.o. female with history of CAD with remote stent to RCA 2001, severe aortic stenosis, aortic dilation, permanent atrial fib, HTN, DM, falls (uses walker), dyslipidemia, PUD, kidney stones, diverticulosis, probable CKD stage III (GFR between 30-60), chronic LEE who presented to [facility] with recurrent chest pain.  Outpatient notes indicate she previously declined anticoagulation. Dr. Irish Lack has felt this is reasonable given her fall history per 12/2016 OV. She was recently admitted for chest pain and troponin 0.03. Echo showed progression to severe AS. Her chest pain was somewhat atypical and so Dr. Sallyanne Kuster felt she was appropriate for outpatient workup. Lexiscan nuclear stress test. Dr. Sallyanne Kuster did feel she would require AVR within the next 6-12 months but did not think she would be suitable for SAVR, but instead would consider TAVR instead. Regarding anticoagulation, family reported only one fall since last OV but anticoagulation was not pursued as it was felt she would likely need cath and TAVR at some point. 2D echo 02/20/18 showed severe LVH, EF 60-65%, severely restricted AV with severe stenosis, mild MR/AI, mod LAE, PASP 81mmHg. CXR no acute abnormality. Lexiscan was scheduled for this Friday but she presented back with more CP in the interim.   She was sitting in the back of a car today coming home from beauty shop and felt like she was getting overheated. However, she otherwise felt OK until she got inside. While at rest she began to feel diffuse chest pressure, shortness of breath, and clamminess. This felt just like the "spell"  she had that prompted last admission. She did not have any palpitations or dizziness. She alerted her family right away who called EMS. She was given 324mg  ASA. Initial CBG was 52 so she was given snacks to eat. She initially refused an IV then was agreeable. Some of her SBPs have been in the 80s but when I arrived this was via and oddly placed wrist cuff; repeat pressure with appropriate sized cuff (albeit poorly tolerated by patient) was 160s, consistent with EMS BP. CP resolved by the time she got to the ER. Telemetry shows persistent atrial fib with bradycardia, at times down to upper 30s - has not had specific symptoms while here in the ER to correlate. Trop 0.03 regular and 0.02 POC, BNP 110, Cr 1.26 (previously 1.07), CBC OK. She initially denied SOB but family states she does huff and puff with walking and pt does not disagree.   Past Medical History:  Diagnosis Date  . Ascending aorta dilatation (HCC)    mild, echo, February, 2009  . Asthma   . Blindness of left eye    related to some type of stroke in the past  . CAD (coronary artery disease)    a. stents right coronary artery 2001.   . CKD (chronic kidney disease), stage III ([place])   . Colon polyp   . Diverticulosis   . DM (diabetes mellitus) ([place])   . Dyslipidemia   . Falling episodes   . HTN (hypertension)   . Kidney stones   . Obesity   . Persistent atrial fibrillation (HCC)    a. pt declined anticoag. also hx of falls.  Marland Kitchen  PUD (peptic ulcer disease)   . Severe aortic stenosis   . UTI (urinary tract infection)   . Ventral hernia      Surgical History:  Past Surgical History:  Procedure Laterality Date  . APPENDECTOMY    . ESOPHAGOGASTRODUODENOSCOPY N/A 11/07/2013   Procedure: ESOPHAGOGASTRODUODENOSCOPY (EGD);  Surgeon: Lafayette Dragon, MD;  Location: [facility];  Service: Endoscopy;  Laterality: N/A;  . TONSILLECTOMY    . TOTAL ABDOMINAL HYSTERECTOMY       Home Meds: Prior to Admission medications   Medication Sig  Start Date End Date Taking? Authorizing Provider  acetaminophen (TYLENOL) 500 MG tablet Take 1,000 mg by mouth every 8 (eight) hours as needed (for pain).   Yes [provider]  albuterol (PROVENTIL) (2.5 MG/3ML) 0.083% nebulizer solution Take 3 mLs by nebulization every 4 (four) hours as needed for wheezing. 08/04/17  Yes [provider]  ALPRAZolam Duanne Moron) 1 MG tablet Take 1 mg by mouth at bedtime.    Yes [provider]  amitriptyline (ELAVIL) 10 MG tablet Take 1 tablet (10 mg total) by mouth at bedtime as needed for sleep. 02/21/18  Yes Emokpae, Courage, MD  aspirin EC 325 MG tablet Take 1 tablet (325 mg total) by mouth daily. With Food 02/21/18  Yes Roxan Hockey, MD  atorvastatin (LIPITOR) 10 MG tablet Take 1 tablet (10 mg total) by mouth every Monday, Wednesday, and Friday. 03/01/18  Yes Jettie Booze, MD  Calcium Carb-Cholecalciferol (CALTRATE 600+D3 PO) Take 1 tablet by mouth daily with breakfast.   Yes [provider]  Cranberry-Vitamin C (AZO CRANBERRY URINARY TRACT) 250-60 MG CAPS Take 2 capsules by mouth daily with breakfast.   Yes [provider]  donepezil (ARICEPT) 10 MG tablet Take 10 mg by mouth at bedtime.    Yes [provider]  fluconazole (DIFLUCAN) 150 MG tablet Take 150 mg by mouth once as needed (for intermittent yeast infections).   Yes [provider]  insulin NPH-regular Human (NOVOLIN 70/30 RELION) (70-30) 100 UNIT/ML injection Inject 15-28 Units into the skin See admin instructions. Inject 28 units into the skin after breakfast and 15 units at bedtime   Yes [provider]  Lactobacillus Rhamnosus, GG, (CULTURELLE) CAPS Take 1 capsule by mouth daily.   Yes [provider]  loratadine (CLARITIN) 10 MG tablet Take 10 mg by mouth daily.   Yes [provider]  losartan-hydrochlorothiazide (HYZAAR) 100-12.5 MG tablet Take 1 tablet by mouth daily with breakfast. 06/25/16  Yes [provider]  metFORMIN (GLUCOPHAGE) 500 MG tablet Take 500 mg by mouth 2 (two) times daily with a meal.   Yes [provider]  nystatin cream (MYCOSTATIN) Apply 1 application topically 3 (three) times daily as needed (for vaginal yeast infections).  06/14/16  Yes [provider]  oxybutynin (DITROPAN-XL) 10 MG 24 hr tablet Take 10 mg by mouth daily with breakfast.  12/29/15  Yes [provider]  Psyllium (METAMUCIL FIBER PO) Take 10 g by mouth daily with breakfast.   Yes [provider]  sorbitol 70 % SOLN Take 20 mLs by mouth daily as needed for moderate constipation. 07/25/16  Yes Nita Sells, MD    Allergies:  Allergies  Allergen Reactions  . Latex Rash    Social History   Socioeconomic History  . Marital status: Married    Spouse name: Not on file  . Number of children: 2  . Years of education: Not on file  . Highest education level:  Not on file  Occupational History  . Occupation: retired    Fish farm manager: RETIRED  Social Needs  . Financial resource strain: Not on file  . Food insecurity:    Worry: Not on file    Inability: Not on file  . Transportation needs:    Medical: Not on file    Non-medical: Not on file  Tobacco Use  . Smoking status: Never Smoker  . Smokeless tobacco: Never Used  Substance and Sexual Activity  . Alcohol use: No  . Drug use: No  . Sexual activity: Yes  Lifestyle  . Physical activity:    Days per week: Not on file    Minutes per session: Not on file  . Stress: Not on file  Relationships  . Social connections:    Talks on phone: Not on file    Gets together: Not on file    Attends religious service: Not on file    Active member of club or organization: Not on file    Attends meetings of clubs or organizations: Not on file    Relationship status: Not on file  . Intimate partner violence:    Fear of current or ex partner: Not on file    Emotionally abused: Not on file    Physically abused: Not  on file    Forced sexual activity: Not on file  Other Topics Concern  . Not on file  Social History Narrative  . Not on file     Family History  Problem Relation Age of Onset  . Colon cancer Mother   . Heart attack Father        30  . Diabetes Sister   . Diabetes Daughter        x 2  . Hypertension Neg Hx   . Stroke Neg Hx     Review of Systems: + chronic LEE which they feel is baseline All other systems reviewed and are otherwise negative except as noted above.  Labs:   Lab Results  Component Value Date   WBC 8.7 03/03/2018   HGB 12.4 03/03/2018   HCT 41.3 03/03/2018   MCV 83.4 03/03/2018   PLT 346 03/03/2018    Recent Labs  Lab 03/03/18 1614  NA 140  K 4.3  CL 98  CO2 31  BUN 20  CREATININE 1.26*  CALCIUM 10.0  PROT 6.6  BILITOT 0.7  ALKPHOS 56  ALT 12  AST 27  GLUCOSE 124*   Recent Labs    03/03/18 1614  TROPONINI 0.03*   Lab Results  Component Value Date   CHOL 215 (H) 02/07/2016   HDL 62 02/07/2016   LDLCALC 127 02/07/2016   TRIG 132 02/07/2016   No results found for: DDIMER  Radiology/Studies:  Dg Chest 2 View  Result Date: 02/19/2018 CLINICAL DATA:  Chest pain EXAM: CHEST - 2 VIEW COMPARISON:  08/10/2017. FINDINGS: Cardiomegaly. Mild bilateral interstitial prominence. Mild left base subsegmental atelectasis. No pleural effusion or pneumothorax. IMPRESSION: 1. Cardiomegaly. Mild bilateral interstitial prominence. Mild CHF could present and this fashion. 2.  Mild left base subsegmental atelectasis/infiltrate. Electronically Signed   By: Marcello Moores  Register   On: 02/19/2018 13:41   Dg Chest Portable 1 View  Result Date: 03/03/2018 CLINICAL DATA:  Acute chest pain. EXAM: PORTABLE CHEST 1 VIEW COMPARISON:  02/19/2018 FINDINGS: The cardiomediastinal silhouette is unremarkable. Interstitial prominence again noted. There is no evidence of focal airspace disease, pulmonary edema, suspicious pulmonary nodule/mass, pleural effusion, or pneumothorax. No  acute bony  abnormalities are identified. Remote RIGHT humeral fracture again noted. IMPRESSION: No acute abnormality. Electronically Signed   By: Margarette Canada M.D.   On: 03/03/2018 16:21   Ct Angio Chest/abd/pel For Dissection W And/or Wo Contrast  Result Date: 02/19/2018 CLINICAL DATA:  Acute chest and back pain. EXAM: CT ANGIOGRAPHY CHEST, ABDOMEN AND PELVIS TECHNIQUE: Multidetector CT imaging through the chest, abdomen and pelvis was performed using the standard protocol during bolus administration of intravenous contrast. Multiplanar reconstructed images and MIPs were obtained and reviewed to evaluate the vascular anatomy. CONTRAST:  80 mL ISOVUE-370 IOPAMIDOL (ISOVUE-370) INJECTION 76% COMPARISON:  CT scan of August 30, 2004. FINDINGS: CTA CHEST FINDINGS Cardiovascular: Atherosclerosis of thoracic aorta is noted without aneurysm or dissection. No pericardial effusion is noted. Mild coronary artery calcifications are noted. Mediastinum/Nodes: No enlarged mediastinal, hilar, or axillary lymph nodes. Thyroid gland, trachea, and esophagus demonstrate no significant findings. Lungs/Pleura: No pneumothorax or pleural effusion is noted. Mosaic pattern is seen involving the lungs most consistent with small airways disease. Musculoskeletal: No chest wall abnormality. No acute or significant osseous findings. Review of the MIP images confirms the above findings. CTA ABDOMEN AND PELVIS FINDINGS VASCULAR Aorta: Atherosclerosis of abdominal aorta is noted without aneurysm or dissection. Celiac: Patent without evidence of aneurysm, dissection, vasculitis or significant stenosis. SMA: Patent without evidence of aneurysm, dissection, vasculitis or significant stenosis. Renals: Both renal arteries are patent without evidence of aneurysm, dissection, vasculitis, fibromuscular dysplasia or significant stenosis. IMA: Patent without evidence of aneurysm, dissection, vasculitis or significant stenosis. Inflow: Patent without  evidence of aneurysm, dissection, vasculitis or significant stenosis. Veins: No obvious venous abnormality within the limitations of this arterial phase study. Review of the MIP images confirms the above findings. NON-VASCULAR Hepatobiliary: No focal liver abnormality is seen. No gallstones, gallbladder wall thickening, or biliary dilatation. Pancreas: Unremarkable. No pancreatic ductal dilatation or surrounding inflammatory changes. Spleen: Normal in size without focal abnormality. Adrenals/Urinary Tract: Adrenal glands are unremarkable. Kidneys are normal, without renal calculi, focal lesion, or hydronephrosis. Bladder is unremarkable. Stomach/Bowel: The stomach appears normal. There is no evidence of bowel obstruction or inflammation. Status post appendectomy. Sigmoid diverticulosis is noted without inflammation. Lymphatic: No significant adenopathy is noted. Reproductive: Status post hysterectomy. No adnexal masses. Other: No abdominal wall hernia or abnormality. No abdominopelvic ascites. Musculoskeletal: No acute or significant osseous findings. Review of the MIP images confirms the above findings. IMPRESSION: No evidence of thoracic or abdominal aortic dissection or aneurysm. Mosaic pattern is noted throughout both lungs most consistent with small airways disease. Sigmoid diverticulosis is noted without inflammation. Aortic Atherosclerosis (ICD10-I70.0). Electronically Signed   By: Marijo Conception, M.D.   On: 02/19/2018 18:03   Wt Readings from Last 3 Encounters:  02/21/18 186 lb 12.8 oz (84.7 kg)  07/28/17 163 lb 4 oz (74 kg)  01/16/17 180 lb (81.6 kg)    EKG: Atrial fib mildly bradycardic 59bpm no acute ST T changes  Physical Exam: Blood pressure 124/64, pulse (!) 58, temperature 97.6 F (36.4 C), temperature source Oral, resp. rate 15, SpO2 100 %. There is no height or weight on file to calculate BMI. General: Well developed, well nourished obese WF in no acute distress. Lying nearly flat in  bed without dyspnea Head: Normocephalic, atraumatic, sclera non-icteric, no xanthomas, nares are without discharge.  Neck: Negative for carotid bruits. JVD not elevated. Lungs: Clear bilaterally to auscultation without wheezes, rales, or rhonchi. Breathing is unlabored. Heart: Irregularly irregular, rate 50s. 2/6 SEM with absent S2 Abdomen: Soft,  non-tender, non-distended with normoactive bowel sounds. No hepatomegaly. No rebound/guarding. No obvious abdominal masses. Msk:  Strength and tone appear normal for age. Extremities: No clubbing or cyanosis. 1+ soft LE pedal edema.  Distal pedal pulses are 2+ and equal bilaterally. Neuro: Alert and oriented X 3. No focal deficit. No facial asymmetry. Moves all extremities spontaneously. Psych:  Responds to questions appropriately with a normal affect.    Assessment and Plan   1. Recurrent chest pain concerning for unstable angina in the setting of known CAD, but also could equally be related to severe aortic stenosis +/- bradycardia - initial troponin slightly elevated, POC around the same time was unremarkable. Will decrease ASA to 81mg  daily and continue statin. Avoid BB with bradycardia. Follow bradycardia on telemetry. Likely needs cardiac cath this admission and eval by structural heart team to discuss possible TAVR. Follow creatinine. Tomorrow is a holiday so will likely need to be scheduled for Friday if she remains stable. Sent message to office to cancel nuclear stress test. Hold off heparin unless enzymes trend upward.  2. Permanent atrial fibrillation with slow ventricular response at times - in ER, I saw rare occurrences on tele where HR would drop to upper 30s/low 40s transiently then quickly go back to 50s. Check TSH. Follow on telemetry to demonstrate any relationship to recurrent symptoms. If she demonstrates association would need EP input as she is not on any AVN blocking agents otherwise. May be related to severe aortic valve disease.  Aricept can cause some bradycardia so will hold while inpatient.  3. Essential HTN, BP appears somewhat elevated - follow. Will continue ARB for now but hold HCTZ given mild rise in Cr.   4. CKD stage III - monitor this admission. Hold HCTZ in prep for probable cath.   5. Diabetes mellitus with hypoglycemia on admission - hold metformin and insulin on admission for now trending CBGs. Add CBG checks and moderate SSI. May be able to re-introduce some of home insulin.  6. Lower extremity edema - chronic per patient, Not on any Lasix. Given need for cath and CKD would not pursue diuresis at present time. BNP only 110.  Code status: reviewed with patient and daughter, she wishes to remain full code. I did encourage them to let us know if they discuss further and wish to make any changes to the plan as we really just want to do what she feels is best for her.   Severity of Illness: The appropriate patient status for this patient is INPATIENT. Inpatient status is judged to be reasonable and necessary in order to provide the required intensity of service to ensure the patient's safety. The patient's presenting symptoms, physical exam findings, and initial radiographic and laboratory data in the context of their chronic comorbidities is felt to place them at high risk for further clinical deterioration. Furthermore, it is not anticipated that the patient will be medically stable for discharge from the hospital within 2 midnights of admission. The following factors support the patient status of inpatient.   " The patient's presenting symptoms include chest pain, clamminess. " The worrisome physical exam findings include systolic murmur. " The initial radiographic and laboratory data are worrisome because of abnormal troponin. " The chronic co-morbidities include listed above, CAD, severe AS, CKD III, advanced age   * I certify that at the point of admission it is my clinical judgment that the patient  will require inpatient hospital care spanning beyond 2 midnights from the point of admission  due to high intensity of service, high risk for further deterioration and high frequency of surveillance required.*    For questions or updates, please contact [place] Please consult [email or web address] for contact info under Cardiology/STEMI.  Signed, Charlie Pitter, PA-C 03/03/2018, 7:41 PM

## 2018-03-04 ENCOUNTER — Other Ambulatory Visit: Payer: Self-pay

## 2018-03-04 DIAGNOSIS — E669 Obesity, unspecified: Secondary | ICD-10-CM

## 2018-03-04 DIAGNOSIS — I69398 Other sequelae of cerebral infarction: Secondary | ICD-10-CM | POA: Diagnosis not present

## 2018-03-04 DIAGNOSIS — E1122 Type 2 diabetes mellitus with diabetic chronic kidney disease: Secondary | ICD-10-CM | POA: Diagnosis present

## 2018-03-04 DIAGNOSIS — I25118 Atherosclerotic heart disease of native coronary artery with other forms of angina pectoris: Secondary | ICD-10-CM | POA: Diagnosis not present

## 2018-03-04 DIAGNOSIS — R0789 Other chest pain: Secondary | ICD-10-CM | POA: Diagnosis not present

## 2018-03-04 DIAGNOSIS — R5381 Other malaise: Secondary | ICD-10-CM | POA: Diagnosis present

## 2018-03-04 DIAGNOSIS — E1169 Type 2 diabetes mellitus with other specified complication: Secondary | ICD-10-CM

## 2018-03-04 DIAGNOSIS — N183 Chronic kidney disease, stage 3 (moderate): Secondary | ICD-10-CM | POA: Diagnosis not present

## 2018-03-04 DIAGNOSIS — Z7189 Other specified counseling: Secondary | ICD-10-CM | POA: Diagnosis not present

## 2018-03-04 DIAGNOSIS — E11649 Type 2 diabetes mellitus with hypoglycemia without coma: Secondary | ICD-10-CM | POA: Diagnosis present

## 2018-03-04 DIAGNOSIS — E785 Hyperlipidemia, unspecified: Secondary | ICD-10-CM

## 2018-03-04 DIAGNOSIS — R600 Localized edema: Secondary | ICD-10-CM | POA: Diagnosis present

## 2018-03-04 DIAGNOSIS — I482 Chronic atrial fibrillation: Secondary | ICD-10-CM | POA: Diagnosis present

## 2018-03-04 DIAGNOSIS — I272 Pulmonary hypertension, unspecified: Secondary | ICD-10-CM | POA: Diagnosis present

## 2018-03-04 DIAGNOSIS — I10 Essential (primary) hypertension: Secondary | ICD-10-CM | POA: Diagnosis not present

## 2018-03-04 DIAGNOSIS — I259 Chronic ischemic heart disease, unspecified: Secondary | ICD-10-CM | POA: Diagnosis not present

## 2018-03-04 DIAGNOSIS — M25551 Pain in right hip: Secondary | ICD-10-CM | POA: Diagnosis not present

## 2018-03-04 DIAGNOSIS — I129 Hypertensive chronic kidney disease with stage 1 through stage 4 chronic kidney disease, or unspecified chronic kidney disease: Secondary | ICD-10-CM | POA: Diagnosis present

## 2018-03-04 DIAGNOSIS — I2510 Atherosclerotic heart disease of native coronary artery without angina pectoris: Secondary | ICD-10-CM | POA: Diagnosis present

## 2018-03-04 DIAGNOSIS — R296 Repeated falls: Secondary | ICD-10-CM | POA: Diagnosis present

## 2018-03-04 DIAGNOSIS — M549 Dorsalgia, unspecified: Secondary | ICD-10-CM | POA: Diagnosis not present

## 2018-03-04 DIAGNOSIS — I77810 Thoracic aortic ectasia: Secondary | ICD-10-CM | POA: Diagnosis present

## 2018-03-04 DIAGNOSIS — Z66 Do not resuscitate: Secondary | ICD-10-CM | POA: Diagnosis present

## 2018-03-04 DIAGNOSIS — M25511 Pain in right shoulder: Secondary | ICD-10-CM | POA: Diagnosis not present

## 2018-03-04 DIAGNOSIS — J45909 Unspecified asthma, uncomplicated: Secondary | ICD-10-CM | POA: Diagnosis present

## 2018-03-04 DIAGNOSIS — Y92239 Unspecified place in hospital as the place of occurrence of the external cause: Secondary | ICD-10-CM | POA: Diagnosis not present

## 2018-03-04 DIAGNOSIS — I252 Old myocardial infarction: Secondary | ICD-10-CM | POA: Diagnosis not present

## 2018-03-04 DIAGNOSIS — I25119 Atherosclerotic heart disease of native coronary artery with unspecified angina pectoris: Secondary | ICD-10-CM | POA: Diagnosis not present

## 2018-03-04 DIAGNOSIS — I700 Atherosclerosis of aorta: Secondary | ICD-10-CM | POA: Diagnosis present

## 2018-03-04 DIAGNOSIS — Z515 Encounter for palliative care: Secondary | ICD-10-CM | POA: Diagnosis not present

## 2018-03-04 DIAGNOSIS — Z6839 Body mass index (BMI) 39.0-39.9, adult: Secondary | ICD-10-CM | POA: Diagnosis not present

## 2018-03-04 DIAGNOSIS — H5462 Unqualified visual loss, left eye, normal vision right eye: Secondary | ICD-10-CM | POA: Diagnosis present

## 2018-03-04 DIAGNOSIS — W07XXXA Fall from chair, initial encounter: Secondary | ICD-10-CM | POA: Diagnosis not present

## 2018-03-04 DIAGNOSIS — I350 Nonrheumatic aortic (valve) stenosis: Secondary | ICD-10-CM | POA: Diagnosis present

## 2018-03-04 LAB — GLUCOSE, CAPILLARY
GLUCOSE-CAPILLARY: 173 mg/dL — AB (ref 70–99)
GLUCOSE-CAPILLARY: 220 mg/dL — AB (ref 70–99)
GLUCOSE-CAPILLARY: 134 mg/dL — AB (ref 70–99)
Glucose-Capillary: 148 mg/dL — ABNORMAL HIGH (ref 70–99)
Glucose-Capillary: 244 mg/dL — ABNORMAL HIGH (ref 70–99)

## 2018-03-04 LAB — TSH: TSH: 2.693 u[IU]/mL (ref 0.350–4.500)

## 2018-03-04 LAB — MRSA PCR SCREENING: MRSA BY PCR: NEGATIVE

## 2018-03-04 LAB — TROPONIN I
TROPONIN I: 0.03 ng/mL — AB (ref <0.03)
TROPONIN I: 0.03 ng/mL — AB (ref <0.03)
Troponin I: 0.03 ng/mL (ref <0.03)

## 2018-03-04 MED ORDER — SODIUM CHLORIDE 0.9% FLUSH: 3.0000 mL | INTRAVENOUS | Status: DC | PRN

## 2018-03-04 MED ORDER — SODIUM CHLORIDE 0.9% FLUSH
3.0000 mL | Freq: Two times a day (BID) | INTRAVENOUS | Status: DC
Start: 2018-03-04 — End: 2018-03-11
  Administered 2018-03-04 – 2018-03-10 (×10): 3 mL via INTRAVENOUS

## 2018-03-04 MED ORDER — HEPARIN SODIUM (PORCINE) 5000 UNIT/ML IJ SOLN
5000.0000 [IU] | Freq: Three times a day (TID) | INTRAMUSCULAR | Status: DC
  Administered 2018-03-04 – 2018-03-05 (×3): 5000 [IU] via SUBCUTANEOUS
  Filled 2018-03-04 (×4): qty 1

## 2018-03-04 MED ORDER — SODIUM CHLORIDE 0.9 % IV SOLN: 250.0000 mL | INTRAVENOUS | Status: DC | PRN

## 2018-03-04 MED ORDER — ATORVASTATIN CALCIUM 10 MG PO TABS
10.0000 mg | ORAL_TABLET | ORAL | Status: DC
  Administered 2018-03-05 – 2018-03-10 (×3): 10 mg via ORAL
  Filled 2018-03-04 (×4): qty 1

## 2018-03-04 MED ORDER — LORATADINE 10 MG PO TABS
10.0000 mg | ORAL_TABLET | Freq: Every day | ORAL | Status: DC
  Administered 2018-03-04 – 2018-03-10 (×7): 10 mg via ORAL
  Filled 2018-03-04 (×7): qty 1

## 2018-03-04 MED ORDER — LOSARTAN POTASSIUM 50 MG PO TABS
100.0000 mg | ORAL_TABLET | Freq: Every day | ORAL | Status: DC
  Administered 2018-03-04 – 2018-03-09 (×6): 100 mg via ORAL
  Filled 2018-03-04 (×6): qty 2

## 2018-03-04 MED ORDER — OXYBUTYNIN CHLORIDE ER 10 MG PO TB24
10.0000 mg | ORAL_TABLET | Freq: Every day | ORAL | Status: DC
  Administered 2018-03-04 – 2018-03-10 (×7): 10 mg via ORAL
  Filled 2018-03-04 (×8): qty 1

## 2018-03-04 MED ORDER — ASPIRIN EC 81 MG PO TBEC
81.0000 mg | DELAYED_RELEASE_TABLET | Freq: Every day | ORAL | Status: DC
  Administered 2018-03-04: 81 mg via ORAL
  Filled 2018-03-04: qty 1

## 2018-03-04 MED ORDER — AMITRIPTYLINE HCL 10 MG PO TABS
10.0000 mg | ORAL_TABLET | Freq: Every evening | ORAL | Status: DC | PRN
  Filled 2018-03-04: qty 1

## 2018-03-04 MED ORDER — SODIUM CHLORIDE 0.9 % WEIGHT BASED INFUSION
1.0000 mL/kg/h | INTRAVENOUS | Status: DC
  Administered 2018-03-04 – 2018-03-05 (×2): 1 mL/kg/h via INTRAVENOUS

## 2018-03-04 MED ORDER — INSULIN ASPART 100 UNIT/ML ~~LOC~~ SOLN
0.0000 [IU] | Freq: Three times a day (TID) | SUBCUTANEOUS | Status: DC
  Administered 2018-03-04: 2 [IU] via SUBCUTANEOUS
  Administered 2018-03-04: 5 [IU] via SUBCUTANEOUS
  Administered 2018-03-05: 2 [IU] via SUBCUTANEOUS
  Administered 2018-03-05 – 2018-03-07 (×4): 3 [IU] via SUBCUTANEOUS
  Administered 2018-03-07: 2 [IU] via SUBCUTANEOUS
  Administered 2018-03-07: 8 [IU] via SUBCUTANEOUS
  Administered 2018-03-08: 3 [IU] via SUBCUTANEOUS
  Administered 2018-03-08: 8 [IU] via SUBCUTANEOUS
  Administered 2018-03-08 – 2018-03-09 (×4): 3 [IU] via SUBCUTANEOUS
  Administered 2018-03-10: 2 [IU] via SUBCUTANEOUS

## 2018-03-04 MED ORDER — PSYLLIUM 95 % PO PACK
1.0000 | PACK | Freq: Every day | ORAL | Status: DC
  Administered 2018-03-04 – 2018-03-10 (×7): 1 via ORAL
  Filled 2018-03-04 (×7): qty 1

## 2018-03-04 MED ORDER — ACETAMINOPHEN 500 MG PO TABS
1000.0000 mg | ORAL_TABLET | Freq: Three times a day (TID) | ORAL | Status: DC | PRN
  Administered 2018-03-06 – 2018-03-09 (×3): 1000 mg via ORAL
  Filled 2018-03-04 (×4): qty 2

## 2018-03-04 MED ORDER — SORBITOL 70 % SOLN: 20.0000 mL | Freq: Every day | Status: DC | PRN

## 2018-03-04 MED ORDER — ALBUTEROL SULFATE (2.5 MG/3ML) 0.083% IN NEBU
3.0000 mL | INHALATION_SOLUTION | RESPIRATORY_TRACT | Status: DC | PRN
  Administered 2018-03-04 – 2018-03-06 (×2): 3 mL via RESPIRATORY_TRACT
  Filled 2018-03-04 (×3): qty 3

## 2018-03-04 MED ORDER — ONDANSETRON HCL 4 MG/2ML IJ SOLN
4.0000 mg | Freq: Four times a day (QID) | INTRAMUSCULAR | Status: DC | PRN
  Administered 2018-03-07: 4 mg via INTRAVENOUS
  Filled 2018-03-04: qty 2

## 2018-03-04 MED ORDER — ALPRAZOLAM 0.5 MG PO TABS
1.0000 mg | ORAL_TABLET | Freq: Every day | ORAL | Status: DC
  Administered 2018-03-04 – 2018-03-08 (×6): 1 mg via ORAL
  Filled 2018-03-04 (×8): qty 2

## 2018-03-04 MED ORDER — SODIUM CHLORIDE 0.9% FLUSH
3.0000 mL | Freq: Two times a day (BID) | INTRAVENOUS | Status: DC
  Administered 2018-03-04: 3 mL via INTRAVENOUS

## 2018-03-04 NOTE — Progress Notes (Signed)
DAILY PROGRESS NOTE   Patient Name: Cassidy [name] Date of Encounter: 03/04/2018  Chief Complaint   No chest pain overnight.  Patient Profile   84 yo female with severe AS, admitted for recurrent chest pain with plans for definitive cath.  Subjective   No issues overnight - troponin was flat borderline elevated at 0.03. BNP 110. She is in slow a-fib. No further chest pain. Creatinine elevated at 1.26 yesterday.  Objective   Vitals:   03/04/18 0000 03/04/18 0030 03/04/18 0100 03/04/18 0105  BP:  (!) 119/56  139/68  Pulse: 71 62    Resp: 16 15  (!) 22  Temp:    97.9 F (36.6 C)  TempSrc:    Oral  SpO2: 95% 94%  96%  Weight:   187 lb 13.3 oz (85.2 kg)   Height:   4' 11"  (1.499 m)     Intake/Output Summary (Last 24 hours) at 03/04/2018 5300 Last data filed at 03/04/2018 0500 Gross per 24 hour  Intake 240 ml  Output 100 ml  Net 140 ml   Filed Weights   03/04/18 0100  Weight: 187 lb 13.3 oz (85.2 kg)    Physical Exam   General appearance: alert, no distress and morbidly obese Neck: no carotid bruit, no JVD and thyroid not enlarged, symmetric, no tenderness/mass/nodules Lungs: clear to auscultation bilaterally Heart: regular rate and rhythm, S1: normal, S2: decreased intensity and systolic murmur: late systolic 3/6, crescendo at 2nd right intercostal space Abdomen: soft, non-tender; bowel sounds normal; no masses,  no organomegaly Extremities: extremities normal, atraumatic, no cyanosis or edema Pulses: 2+ and symmetric Skin: Skin color, texture, turgor normal. No rashes or lesions Neurologic: Grossly normal Psych: Pleasant  Inpatient Medications    Scheduled Meds: . ALPRAZolam  1 mg Oral QHS  . aspirin EC  81 mg Oral Daily  . [START ON 03/05/2018] atorvastatin  10 mg Oral Q M,W,F  . heparin  5,000 Units Subcutaneous Q8H  . insulin aspart  0-15 Units Subcutaneous TID WC  . loratadine  10 mg Oral Daily  . losartan  100 mg Oral Daily  . oxybutynin  10 mg Oral Q  breakfast  . psyllium  1 packet Oral Q breakfast  . sodium chloride flush  3 mL Intravenous Q12H    Continuous Infusions: . sodium chloride      PRN Meds: sodium chloride, acetaminophen, albuterol, amitriptyline, ondansetron (ZOFRAN) IV, sodium chloride flush, sorbitol   Labs   Results for orders placed or performed during the hospital encounter of 03/03/18 (from the past 48 hour(s))  CBG monitoring, ED     Status: Abnormal   Collection Time: 03/03/18  3:18 PM  Result Value Ref Range   Glucose-Capillary 61 (L) 70 - 99 mg/dL  Comprehensive metabolic panel     Status: Abnormal   Collection Time: 03/03/18  4:14 PM  Result Value Ref Range   Sodium 140 135 - 145 mmol/L   Potassium 4.3 3.5 - 5.1 mmol/L   Chloride 98 98 - 111 mmol/L    Comment: Please note change in reference range.   CO2 31 22 - 32 mmol/L   Glucose, Bld 124 (H) 70 - 99 mg/dL    Comment: Please note change in reference range.   BUN 20 8 - 23 mg/dL    Comment: Please note change in reference range.   Creatinine, Ser 1.26 (H) 0.44 - 1.00 mg/dL   Calcium 10.0 8.9 - 10.3 mg/dL   Total Protein 6.6  6.5 - 8.1 g/dL   Albumin 3.3 (L) 3.5 - 5.0 g/dL   AST 27 15 - 41 U/L   ALT 12 0 - 44 U/L    Comment: Please note change in reference range.   Alkaline Phosphatase 56 38 - 126 U/L   Total Bilirubin 0.7 0.3 - 1.2 mg/dL   GFR calc non Af Amer 37 (L) >60 mL/min   GFR calc Af Amer 42 (L) >60 mL/min    Comment: (NOTE) The eGFR has been calculated using the CKD EPI equation. This calculation has not been validated in all clinical situations. eGFR's persistently <60 mL/min signify possible Chronic Kidney Disease.    Anion gap 11 5 - 15    Comment: Performed at [facility] [address] [place], [place] [address]  Magnesium     Status: None   Collection Time: 03/03/18  4:14 PM  Result Value Ref Range   Magnesium 1.9 1.7 - 2.4 mg/dL    Comment: Performed at [facility], [place] [address], [place], [place]  [address]  Troponin I (order at [facility])     Status: Abnormal   Collection Time: 03/03/18  4:14 PM  Result Value Ref Range   Troponin I 0.03 (HH) <0.03 ng/mL    Comment: CRITICAL RESULT CALLED TO, READ BACK BY AND VERIFIED WITH: Darrold Span RN [id number] GREEN R Performed at [facility] [place], [place] [address]   Brain natriuretic peptide (order if patient c/o SOB ONLY)     Status: Abnormal   Collection Time: 03/03/18  4:14 PM  Result Value Ref Range   B Natriuretic Peptide 110.0 (H) 0.0 - 100.0 pg/mL    Comment: Performed at [facility] [address], [place], [place] [address]  CBC with Differential/Platelet     Status: Abnormal   Collection Time: 03/03/18  4:14 PM  Result Value Ref Range   WBC 8.7 4.0 - 10.5 K/uL   RBC 4.95 3.87 - 5.11 MIL/uL   Hemoglobin 12.4 12.0 - 15.0 g/dL   HCT 41.3 36.0 - 46.0 %   MCV 83.4 78.0 - 100.0 fL   MCH 25.1 (L) 26.0 - 34.0 pg   MCHC 30.0 30.0 - 36.0 g/dL   RDW 17.7 (H) 11.5 - 15.5 %   Platelets 346 150 - 400 K/uL   Neutrophils Relative % 67 %   Neutro Abs 5.8 1.7 - 7.7 K/uL   Lymphocytes Relative 15 %   Lymphs Abs 1.3 0.7 - 4.0 K/uL   Monocytes Relative 11 %   Monocytes Absolute 1.0 0.1 - 1.0 K/uL   Eosinophils Relative 5 %   Eosinophils Absolute 0.4 0.0 - 0.7 K/uL   Basophils Relative 1 %   Basophils Absolute 0.1 0.0 - 0.1 K/uL   Immature Granulocytes 1 %   Abs Immature Granulocytes 0.0 0.0 - 0.1 K/uL    Comment: Performed at [facility] [address], [place], [place] [address]  Protime-INR     Status: None   Collection Time: 03/03/18  4:14 PM  Result Value Ref Range   Prothrombin Time 13.1 11.4 - 15.2 seconds   INR 1.00     Comment: Performed at [facility] [address] [place], [place] [address]  I-stat troponin, ED     Status: None   Collection Time: 03/03/18  5:55 PM  Result Value Ref Range   Troponin i, poc 0.02 0.00 - 0.08  ng/mL   Comment 3            Comment: Due to the  release kinetics of cTnI, a negative result within the first hours of the onset of symptoms does not rule out myocardial infarction with certainty. If myocardial infarction is still suspected, repeat the test at appropriate intervals.   Glucose, capillary     Status: Abnormal   Collection Time: 03/04/18  1:06 AM  Result Value Ref Range   Glucose-Capillary 173 (H) 70 - 99 mg/dL  MRSA PCR Screening     Status: None   Collection Time: 03/04/18  1:14 AM  Result Value Ref Range   MRSA by PCR NEGATIVE NEGATIVE    Comment:        The GeneXpert MRSA Assay (FDA approved for NASAL specimens only), is one component of a comprehensive MRSA colonization surveillance program. It is not intended to diagnose MRSA infection nor to guide or monitor treatment for MRSA infections. Performed at [facility], [place] [address], [place], [place] [address]   Troponin I     Status: Abnormal   Collection Time: 03/04/18  2:33 AM  Result Value Ref Range   Troponin I 0.03 (HH) <0.03 ng/mL    Comment: CRITICAL VALUE NOTED.  VALUE IS CONSISTENT WITH PREVIOUSLY REPORTED AND CALLED VALUE. Performed at [facility], [place] [address], [place], [place] [address]   TSH     Status: None   Collection Time: 03/04/18  2:33 AM  Result Value Ref Range   TSH 2.693 0.350 - 4.500 uIU/mL    Comment: Performed by a 3rd Generation assay with a functional sensitivity of <=0.01 uIU/mL. Performed at [facility], [place] [address] [place],  [address]     ECG   pending  Telemetry   A-fib with slow ventricular response - Personally Reviewed  Radiology    Dg Chest Portable 1 View  Result Date: 03/03/2018 CLINICAL DATA:  Acute chest pain. EXAM: PORTABLE CHEST 1 VIEW COMPARISON:  02/19/2018 FINDINGS: The cardiomediastinal silhouette is unremarkable. Interstitial prominence again noted. There is no evidence of focal airspace disease, pulmonary edema, suspicious pulmonary nodule/mass, pleural effusion,  or pneumothorax. No acute bony abnormalities are identified. Remote RIGHT humeral fracture again noted. IMPRESSION: No acute abnormality. Electronically Signed   By: Margarette Canada M.D.   On: 03/03/2018 16:21    Cardiac Studies   N/A  Assessment   1. Active Problems: 2.   CAD (coronary artery disease) 3.   Permanent atrial fibrillation ([place]) 4.   Essential hypertension 5.   Severe aortic stenosis 6.   Dyslipidemia 7.   Diabetes mellitus type 2 in obese ([place]) 8.   Chest pain 9.   CKD (chronic kidney disease), stage III ([place]) 10.   Lower extremity edema 11.   Plan   1. No further chest pain overnight. Troponin essentially negative. BNP low. Plan for [facility] tomorrow. Will hydrate today given elevated creatinine. On prophylactic heparin - not anticoagulated for a-fib d/t falls. Keep NPO p MN.  Time Spent Directly with Patient:  I have spent a total of 25 minutes with the patient reviewing hospital notes, telemetry, EKGs, labs and examining the patient as well as establishing an assessment and plan that was discussed personally with the patient.  > 50% of time was spent in direct patient care.  Length of Stay:  LOS: 0 days   Pixie Casino, MD, [facility], [place]  Medical Director of the Advanced Lipid Disorders &  Cardiovascular Risk Reduction Clinic Diplomate of the American Board of Clinical Lipidology Attending Cardiologist  Direct Dial: [phone]  Fax: [phone]  Website:  [email or web address]  Nadean Corwin Hilty 03/04/2018, 7:13 AM

## 2018-03-04 NOTE — H&P (View-Only) (Signed)
DAILY PROGRESS NOTE   Patient Name: Cassidy [name] Date of Encounter: 03/04/2018  Chief Complaint   No chest pain overnight.  Patient Profile   84 yo female with severe AS, admitted for recurrent chest pain with plans for definitive cath.  Subjective   No issues overnight - troponin was flat borderline elevated at 0.03. BNP 110. She is in slow a-fib. No further chest pain. Creatinine elevated at 1.26 yesterday.  Objective   Vitals:   03/04/18 0000 03/04/18 0030 03/04/18 0100 03/04/18 0105  BP:  (!) 119/56  139/68  Pulse: 71 62    Resp: 16 15  (!) 22  Temp:    97.9 F (36.6 C)  TempSrc:    Oral  SpO2: 95% 94%  96%  Weight:   187 lb 13.3 oz (85.2 kg)   Height:   4' 11"  (1.499 m)     Intake/Output Summary (Last 24 hours) at 03/04/2018 9924 Last data filed at 03/04/2018 0500 Gross per 24 hour  Intake 240 ml  Output 100 ml  Net 140 ml   Filed Weights   03/04/18 0100  Weight: 187 lb 13.3 oz (85.2 kg)    Physical Exam   General appearance: alert, no distress and morbidly obese Neck: no carotid bruit, no JVD and thyroid not enlarged, symmetric, no tenderness/mass/nodules Lungs: clear to auscultation bilaterally Heart: regular rate and rhythm, S1: normal, S2: decreased intensity and systolic murmur: late systolic 3/6, crescendo at 2nd right intercostal space Abdomen: soft, non-tender; bowel sounds normal; no masses,  no organomegaly Extremities: extremities normal, atraumatic, no cyanosis or edema Pulses: 2+ and symmetric Skin: Skin color, texture, turgor normal. No rashes or lesions Neurologic: Grossly normal Psych: Pleasant  Inpatient Medications    Scheduled Meds: . ALPRAZolam  1 mg Oral QHS  . aspirin EC  81 mg Oral Daily  . [START ON 03/05/2018] atorvastatin  10 mg Oral Q M,W,F  . heparin  5,000 Units Subcutaneous Q8H  . insulin aspart  0-15 Units Subcutaneous TID WC  . loratadine  10 mg Oral Daily  . losartan  100 mg Oral Daily  . oxybutynin  10 mg Oral Q  breakfast  . psyllium  1 packet Oral Q breakfast  . sodium chloride flush  3 mL Intravenous Q12H    Continuous Infusions: . sodium chloride      PRN Meds: sodium chloride, acetaminophen, albuterol, amitriptyline, ondansetron (ZOFRAN) IV, sodium chloride flush, sorbitol   Labs   Results for orders placed or performed during the hospital encounter of 03/03/18 (from the past 48 hour(s))  CBG monitoring, ED     Status: Abnormal   Collection Time: 03/03/18  3:18 PM  Result Value Ref Range   Glucose-Capillary 61 (L) 70 - 99 mg/dL  Comprehensive metabolic panel     Status: Abnormal   Collection Time: 03/03/18  4:14 PM  Result Value Ref Range   Sodium 140 135 - 145 mmol/L   Potassium 4.3 3.5 - 5.1 mmol/L   Chloride 98 98 - 111 mmol/L    Comment: Please note change in reference range.   CO2 31 22 - 32 mmol/L   Glucose, Bld 124 (H) 70 - 99 mg/dL    Comment: Please note change in reference range.   BUN 20 8 - 23 mg/dL    Comment: Please note change in reference range.   Creatinine, Ser 1.26 (H) 0.44 - 1.00 mg/dL   Calcium 10.0 8.9 - 10.3 mg/dL   Total Protein 6.6  6.5 - 8.1 g/dL   Albumin 3.3 (L) 3.5 - 5.0 g/dL   AST 27 15 - 41 U/L   ALT 12 0 - 44 U/L    Comment: Please note change in reference range.   Alkaline Phosphatase 56 38 - 126 U/L   Total Bilirubin 0.7 0.3 - 1.2 mg/dL   GFR calc non Af Amer 37 (L) >60 mL/min   GFR calc Af Amer 42 (L) >60 mL/min    Comment: (NOTE) The eGFR has been calculated using the CKD EPI equation. This calculation has not been validated in all clinical situations. eGFR's persistently <60 mL/min signify possible Chronic Kidney Disease.    Anion gap 11 5 - 15    Comment: Performed at [facility] [address] [place], [place] [address]  Magnesium     Status: None   Collection Time: 03/03/18  4:14 PM  Result Value Ref Range   Magnesium 1.9 1.7 - 2.4 mg/dL    Comment: Performed at [facility], [place] [address] [place], [place]  [address]  Troponin I (order at [facility])     Status: Abnormal   Collection Time: 03/03/18  4:14 PM  Result Value Ref Range   Troponin I 0.03 (HH) <0.03 ng/mL    Comment: CRITICAL RESULT CALLED TO, READ BACK BY AND VERIFIED WITH: Darrold Span RN [id number] GREEN R Performed at [facility] [place], [place] [address]   Brain natriuretic peptide (order if patient c/o SOB ONLY)     Status: Abnormal   Collection Time: 03/03/18  4:14 PM  Result Value Ref Range   B Natriuretic Peptide 110.0 (H) 0.0 - 100.0 pg/mL    Comment: Performed at [facility] [address] [place], [place] [address]  CBC with Differential/Platelet     Status: Abnormal   Collection Time: 03/03/18  4:14 PM  Result Value Ref Range   WBC 8.7 4.0 - 10.5 K/uL   RBC 4.95 3.87 - 5.11 MIL/uL   Hemoglobin 12.4 12.0 - 15.0 g/dL   HCT 41.3 36.0 - 46.0 %   MCV 83.4 78.0 - 100.0 fL   MCH 25.1 (L) 26.0 - 34.0 pg   MCHC 30.0 30.0 - 36.0 g/dL   RDW 17.7 (H) 11.5 - 15.5 %   Platelets 346 150 - 400 K/uL   Neutrophils Relative % 67 %   Neutro Abs 5.8 1.7 - 7.7 K/uL   Lymphocytes Relative 15 %   Lymphs Abs 1.3 0.7 - 4.0 K/uL   Monocytes Relative 11 %   Monocytes Absolute 1.0 0.1 - 1.0 K/uL   Eosinophils Relative 5 %   Eosinophils Absolute 0.4 0.0 - 0.7 K/uL   Basophils Relative 1 %   Basophils Absolute 0.1 0.0 - 0.1 K/uL   Immature Granulocytes 1 %   Abs Immature Granulocytes 0.0 0.0 - 0.1 K/uL    Comment: Performed at [facility] [address], [place], [place] [address]  Protime-INR     Status: None   Collection Time: 03/03/18  4:14 PM  Result Value Ref Range   Prothrombin Time 13.1 11.4 - 15.2 seconds   INR 1.00     Comment: Performed at [facility] [address], [place], [place] [address]  I-stat troponin, ED     Status: None   Collection Time: 03/03/18  5:55 PM  Result Value Ref Range   Troponin i, poc 0.02 0.00 - 0.08  ng/mL   Comment 3            Comment: Due to the  release kinetics of cTnI, a negative result within the first hours of the onset of symptoms does not rule out myocardial infarction with certainty. If myocardial infarction is still suspected, repeat the test at appropriate intervals.   Glucose, capillary     Status: Abnormal   Collection Time: 03/04/18  1:06 AM  Result Value Ref Range   Glucose-Capillary 173 (H) 70 - 99 mg/dL  MRSA PCR Screening     Status: None   Collection Time: 03/04/18  1:14 AM  Result Value Ref Range   MRSA by PCR NEGATIVE NEGATIVE    Comment:        The GeneXpert MRSA Assay (FDA approved for NASAL specimens only), is one component of a comprehensive MRSA colonization surveillance program. It is not intended to diagnose MRSA infection nor to guide or monitor treatment for MRSA infections. Performed at [facility], [place] [address], [place], [place] [address]   Troponin I     Status: Abnormal   Collection Time: 03/04/18  2:33 AM  Result Value Ref Range   Troponin I 0.03 (HH) <0.03 ng/mL    Comment: CRITICAL VALUE NOTED.  VALUE IS CONSISTENT WITH PREVIOUSLY REPORTED AND CALLED VALUE. Performed at [facility], [place] [address], [place], [place] [address]   TSH     Status: None   Collection Time: 03/04/18  2:33 AM  Result Value Ref Range   TSH 2.693 0.350 - 4.500 uIU/mL    Comment: Performed by a 3rd Generation assay with a functional sensitivity of <=0.01 uIU/mL. Performed at [facility], [place] [address], [place], [place] [address]     ECG   pending  Telemetry   A-fib with slow ventricular response - Personally Reviewed  Radiology    Dg Chest Portable 1 View  Result Date: 03/03/2018 CLINICAL DATA:  Acute chest pain. EXAM: PORTABLE CHEST 1 VIEW COMPARISON:  02/19/2018 FINDINGS: The cardiomediastinal silhouette is unremarkable. Interstitial prominence again noted. There is no evidence of focal airspace disease, pulmonary edema, suspicious pulmonary nodule/mass, pleural effusion,  or pneumothorax. No acute bony abnormalities are identified. Remote RIGHT humeral fracture again noted. IMPRESSION: No acute abnormality. Electronically Signed   By: Margarette Canada M.D.   On: 03/03/2018 16:21    Cardiac Studies   N/A  Assessment   1. Active Problems: 2.   CAD (coronary artery disease) 3.   Permanent atrial fibrillation ([place]) 4.   Essential hypertension 5.   Severe aortic stenosis 6.   Dyslipidemia 7.   Diabetes mellitus type 2 in obese ([place]) 8.   Chest pain 9.   CKD (chronic kidney disease), stage III ([place]) 10.   Lower extremity edema 11.   Plan   1. No further chest pain overnight. Troponin essentially negative. BNP low. Plan for [facility] tomorrow. Will hydrate today given elevated creatinine. On prophylactic heparin - not anticoagulated for a-fib d/t falls. Keep NPO p MN.  Time Spent Directly with Patient:  I have spent a total of 25 minutes with the patient reviewing hospital notes, telemetry, EKGs, labs and examining the patient as well as establishing an assessment and plan that was discussed personally with the patient.  > 50% of time was spent in direct patient care.  Length of Stay:  LOS: 0 days   Pixie Casino, MD, [facility], [place]  Medical Director of the Advanced Lipid Disorders &  Cardiovascular Risk Reduction Clinic Diplomate of the American Board of Clinical Lipidology Attending Cardiologist  Direct Dial: [phone]  Fax: [phone]  Website:  [email or web address]  Nadean Corwin Hilty 03/04/2018, 7:13 AM

## 2018-03-05 ENCOUNTER — Encounter (HOSPITAL_COMMUNITY): Payer: Self-pay | Admitting: Cardiology

## 2018-03-05 ENCOUNTER — Ambulatory Visit (HOSPITAL_COMMUNITY)
Admission: RE | Admit: 2018-03-05 | Payer: Medicare Other | Source: Ambulatory Visit | Attending: Cardiology | Admitting: Cardiology

## 2018-03-05 ENCOUNTER — Encounter (HOSPITAL_COMMUNITY)
Admission: EM | Disposition: A | Discharge status: Hospice | Payer: Self-pay | Source: Home / Self Care | Attending: Cardiology

## 2018-03-05 HISTORY — PX: RIGHT/LEFT HEART CATH AND CORONARY ANGIOGRAPHY: CATH118266

## 2018-03-05 LAB — POCT I-STAT 3, ART BLOOD GAS (G3+)
Acid-Base Excess: 2 mmol/L (ref 0.0–2.0)
Bicarbonate: 30 mmol/L — ABNORMAL HIGH (ref 20.0–28.0)
O2 SAT: 99 %
PH ART: 7.294 — AB (ref 7.350–7.450)
PO2 ART: 137 mmHg — AB (ref 83.0–108.0)
TCO2: 32 mmol/L (ref 22–32)
pCO2 arterial: 61.9 mmHg — ABNORMAL HIGH (ref 32.0–48.0)

## 2018-03-05 LAB — BASIC METABOLIC PANEL
Anion gap: 5 (ref 5–15)
BUN: 22 mg/dL (ref 8–23)
CO2: 33 mmol/L — ABNORMAL HIGH (ref 22–32)
CREATININE: 1.18 mg/dL — AB (ref 0.44–1.00)
Calcium: 9 mg/dL (ref 8.9–10.3)
Chloride: 102 mmol/L (ref 98–111)
GFR calc Af Amer: 46 mL/min — ABNORMAL LOW (ref >60)
GFR, EST NON AFRICAN AMERICAN: 40 mL/min — AB (ref >60)
Glucose, Bld: 198 mg/dL — ABNORMAL HIGH (ref 70–99)
Potassium: 4.2 mmol/L (ref 3.5–5.1)
Sodium: 140 mmol/L (ref 135–145)

## 2018-03-05 LAB — POCT ACTIVATED CLOTTING TIME
ACTIVATED CLOTTING TIME: 191 s
Activated Clotting Time: 169 seconds

## 2018-03-05 LAB — POCT I-STAT 3, VENOUS BLOOD GAS (G3P V)
ACID-BASE EXCESS: 2 mmol/L (ref 0.0–2.0)
Acid-Base Excess: 2 mmol/L (ref 0.0–2.0)
BICARBONATE: 30.6 mmol/L — AB (ref 20.0–28.0)
BICARBONATE: 31 mmol/L — AB (ref 20.0–28.0)
O2 Saturation: 69 %
O2 Saturation: 70 %
PCO2 VEN: 67.1 mmHg — AB (ref 44.0–60.0)
PCO2 VEN: 68.1 mmHg — AB (ref 44.0–60.0)
PH VEN: 7.261 (ref 7.250–7.430)
PO2 VEN: 43 mmHg (ref 32.0–45.0)
TCO2: 33 mmol/L — AB (ref 22–32)
TCO2: 33 mmol/L — ABNORMAL HIGH (ref 22–32)
pH, Ven: 7.273 (ref 7.250–7.430)
pO2, Ven: 43 mmHg (ref 32.0–45.0)

## 2018-03-05 LAB — CBC
HCT: 39.5 % (ref 36.0–46.0)
Hemoglobin: 12 g/dL (ref 12.0–15.0)
MCH: 25.3 pg — ABNORMAL LOW (ref 26.0–34.0)
MCHC: 30.4 g/dL (ref 30.0–36.0)
MCV: 83.2 fL (ref 78.0–100.0)
Platelets: 315 10*3/uL (ref 150–400)
RBC: 4.75 MIL/uL (ref 3.87–5.11)
RDW: 17.4 % — AB (ref 11.5–15.5)
WBC: 9.6 10*3/uL (ref 4.0–10.5)

## 2018-03-05 LAB — GLUCOSE, CAPILLARY
GLUCOSE-CAPILLARY: 181 mg/dL — AB (ref 70–99)
GLUCOSE-CAPILLARY: 158 mg/dL — AB (ref 70–99)
GLUCOSE-CAPILLARY: 146 mg/dL — AB (ref 70–99)
Glucose-Capillary: 173 mg/dL — ABNORMAL HIGH (ref 70–99)

## 2018-03-05 SURGERY — RIGHT/LEFT HEART CATH AND CORONARY ANGIOGRAPHY
Anesthesia: LOCAL

## 2018-03-05 MED ORDER — SODIUM CHLORIDE 0.9% FLUSH: 3.0000 mL | INTRAVENOUS | Status: DC | PRN

## 2018-03-05 MED ORDER — ASPIRIN EC 81 MG PO TBEC
81.0000 mg | DELAYED_RELEASE_TABLET | Freq: Every day | ORAL | Status: DC
  Administered 2018-03-06 – 2018-03-10 (×5): 81 mg via ORAL
  Filled 2018-03-05 (×5): qty 1

## 2018-03-05 MED ORDER — HEPARIN (PORCINE) IN NACL 1000-0.9 UT/500ML-% IV SOLN
INTRAVENOUS | Status: AC
  Filled 2018-03-05: qty 1000

## 2018-03-05 MED ORDER — HEPARIN SODIUM (PORCINE) 1000 UNIT/ML IJ SOLN
INTRAMUSCULAR | Status: DC | PRN
  Administered 2018-03-05: 4500 [IU] via INTRAVENOUS

## 2018-03-05 MED ORDER — HEPARIN SODIUM (PORCINE) 1000 UNIT/ML IJ SOLN
INTRAMUSCULAR | Status: AC
  Filled 2018-03-05: qty 1

## 2018-03-05 MED ORDER — LIDOCAINE HCL (PF) 1 % IJ SOLN
INTRAMUSCULAR | Status: AC
  Filled 2018-03-05: qty 30

## 2018-03-05 MED ORDER — SODIUM CHLORIDE 0.9 % IV SOLN: 250.0000 mL | INTRAVENOUS | Status: DC | PRN

## 2018-03-05 MED ORDER — FENTANYL CITRATE (PF) 100 MCG/2ML IJ SOLN
INTRAMUSCULAR | Status: AC
  Filled 2018-03-05: qty 2

## 2018-03-05 MED ORDER — NYSTATIN 100000 UNIT/GM EX POWD
Freq: Two times a day (BID) | CUTANEOUS | Status: DC | PRN
  Administered 2018-03-05 – 2018-03-09 (×2): via TOPICAL
  Filled 2018-03-05: qty 15

## 2018-03-05 MED ORDER — FENTANYL CITRATE (PF) 100 MCG/2ML IJ SOLN
INTRAMUSCULAR | Status: DC | PRN
  Administered 2018-03-05 (×3): 25 ug via INTRAVENOUS

## 2018-03-05 MED ORDER — IOPAMIDOL (ISOVUE-370) INJECTION 76%
INTRAVENOUS | Status: AC
  Filled 2018-03-05: qty 100

## 2018-03-05 MED ORDER — IOPAMIDOL (ISOVUE-370) INJECTION 76%
INTRAVENOUS | Status: DC | PRN
  Administered 2018-03-05: 125 mL via INTRA_ARTERIAL

## 2018-03-05 MED ORDER — LIDOCAINE HCL (PF) 1 % IJ SOLN
INTRAMUSCULAR | Status: DC | PRN
  Administered 2018-03-05: 3 mL
  Administered 2018-03-05: 18 mL
  Administered 2018-03-05: 3 mL

## 2018-03-05 MED ORDER — HEPARIN (PORCINE) IN NACL 2-0.9 UNITS/ML
INTRAMUSCULAR | Status: AC | PRN
  Administered 2018-03-05: 500 mL via INTRA_ARTERIAL
  Administered 2018-03-05: 500 mL
  Administered 2018-03-05: 500 mL via INTRA_ARTERIAL

## 2018-03-05 MED ORDER — VERAPAMIL HCL 2.5 MG/ML IV SOLN
INTRAVENOUS | Status: AC
  Filled 2018-03-05: qty 2

## 2018-03-05 MED ORDER — IOPAMIDOL (ISOVUE-370) INJECTION 76%
INTRAVENOUS | Status: AC
  Filled 2018-03-05: qty 50

## 2018-03-05 MED ORDER — ASPIRIN 81 MG PO CHEW
81.0000 mg | CHEWABLE_TABLET | Freq: Once | ORAL | Status: AC
  Administered 2018-03-05: 81 mg via ORAL
  Filled 2018-03-05: qty 1

## 2018-03-05 MED ORDER — SODIUM CHLORIDE 0.9% FLUSH
3.0000 mL | Freq: Two times a day (BID) | INTRAVENOUS | Status: DC
  Administered 2018-03-05 – 2018-03-11 (×5): 3 mL via INTRAVENOUS

## 2018-03-05 MED ORDER — MIDAZOLAM HCL 2 MG/2ML IJ SOLN
INTRAMUSCULAR | Status: DC | PRN
  Administered 2018-03-05: 0.5 mg via INTRAVENOUS
  Administered 2018-03-05: 1 mg via INTRAVENOUS

## 2018-03-05 MED ORDER — VERAPAMIL HCL 2.5 MG/ML IV SOLN
INTRAVENOUS | Status: DC | PRN
  Administered 2018-03-05 (×2): 10 mL via INTRA_ARTERIAL

## 2018-03-05 MED ORDER — HEPARIN SODIUM (PORCINE) 5000 UNIT/ML IJ SOLN
5000.0000 [IU] | Freq: Three times a day (TID) | INTRAMUSCULAR | Status: DC
  Administered 2018-03-05 – 2018-03-10 (×14): 5000 [IU] via SUBCUTANEOUS
  Filled 2018-03-05 (×13): qty 1

## 2018-03-05 MED ORDER — SODIUM CHLORIDE 0.9 % IV SOLN: INTRAVENOUS | Status: AC

## 2018-03-05 MED ORDER — HEPARIN (PORCINE) IN NACL 1000-0.9 UT/500ML-% IV SOLN
INTRAVENOUS | Status: AC
  Filled 2018-03-05: qty 500

## 2018-03-05 MED ORDER — MIDAZOLAM HCL 2 MG/2ML IJ SOLN
INTRAMUSCULAR | Status: AC
  Filled 2018-03-05: qty 2

## 2018-03-05 SURGICAL SUPPLY — 27 items
CATH BALLN WEDGE 5F 110CM (CATHETERS) ×1 IMPLANT
CATH FELDMAN FA1 6F 100CM (CATHETERS) ×1 IMPLANT
CATH INFINITI 5 FR 3DRC (CATHETERS) ×1 IMPLANT
CATH INFINITI 5 FR MPA2 (CATHETERS) ×1 IMPLANT
CATH INFINITI 5FR AL1 (CATHETERS) ×1 IMPLANT
CATH INFINITI MULTIPACK ANG 4F (CATHETERS) ×1 IMPLANT
CATH LAUNCHER 5F EBU3.5 (CATHETERS) ×1 IMPLANT
CATH LAUNCHER 5F RADR (CATHETERS) IMPLANT
CATH OPTITORQUE TIG 4.0 5F (CATHETERS) ×1 IMPLANT
CATHETER LAUNCHER 5F RADR (CATHETERS) ×2
COVER PRB 48X5XTLSCP FOLD TPE (BAG) IMPLANT
COVER PROBE 5X48 (BAG) ×2
DEVICE RAD COMP TR BAND LRG (VASCULAR PRODUCTS) ×1 IMPLANT
ELECT DEFIB PAD ADLT CADENCE (PAD) ×1 IMPLANT
GLIDESHEATH SLEND A-KIT 6F 22G (SHEATH) ×1 IMPLANT
GUIDEWIRE .025 260CM (WIRE) ×1 IMPLANT
GUIDEWIRE INQWIRE 1.5J.035X260 (WIRE) IMPLANT
INQWIRE 1.5J .035X260CM (WIRE) ×2
KIT HEART LEFT (KITS) ×2 IMPLANT
PACK CARDIAC CATHETERIZATION (CUSTOM PROCEDURE TRAY) ×2 IMPLANT
SHEATH PINNACLE 5F 10CM (SHEATH) ×1 IMPLANT
SHEATH RAIN 4/5FR (SHEATH) ×1 IMPLANT
TRANSDUCER W/STOPCOCK (MISCELLANEOUS) ×2 IMPLANT
TUBING CIL FLEX 10 FLL-RA (TUBING) ×2 IMPLANT
WIRE EMERALD 3MM-J .035X150CM (WIRE) ×1 IMPLANT
WIRE EMERALD ST .035X150CM (WIRE) ×1 IMPLANT
WIRE HI TORQ VERSACORE-J 145CM (WIRE) ×1 IMPLANT

## 2018-03-05 NOTE — Progress Notes (Signed)
Site area: rt ac venous sheath Site Prior to Removal:  Level 0; sheath almost out on arrival to holding area Pressure Applied For: 10 minutes Manual:   yes Patient Status During Pull:  stable Post Pull Site:  Level 0 Post Pull Instructions Given:  yes Post Pull Pulses Present: rt radial palpable Dressing Applied:  Gauze and tegaderm Bedrest begins @  Comments:

## 2018-03-05 NOTE — Interval H&P Note (Signed)
History and Physical Interval Note:  03/05/2018 10:49 AM  Cassidy [name]  has presented today for surgery, with the diagnosis of unstable angina with severe AS. the various methods of treatment have been discussed with the patient and family. After consideration of risks, benefits and other options for treatment, the patient has consented to  Procedure(s): RIGHT/LEFT HEART CATH AND CORONARY ANGIOGRAPHY (N/A) as a surgical intervention .  The patient's history has been reviewed, patient examined, no change in status, stable for surgery.  I have reviewed the patient's chart and labs.  Questions were answered to the patient's satisfaction.     Glenetta Hew

## 2018-03-05 NOTE — Consult Note (Addendum)
[place] VALVE TEAM  Inpatient TAVR Consultation:   Patient ID: Cassidy [name]; [id number]; November 04, 1926   Admit date: 03/03/2018 Date of Consult: 03/05/2018  Primary Care Provider: [name], MD Primary Cardiologist: Dr. Irish Lack   Patient Profile:   Cassidy [name] is a 84 y.o. female with a hx of CAD s/p remote stenting to RCA (2001), permanent atrial fib (not on [place]), HTN, DMT2, HLD, CKD, aortic dilation, dementia and severe aortic stenosis who is being seen today for the evaluation of severe symptomatic AS at the request of Dr. Harrell Gave.  History of Present Illness:   [name] has had mild-moderate aortic stenosis followed by echocardiography for approximately the last 10 years. She was followed by Dr. Ron Parker and more recently Dr. Irish Lack.   She has a history of permanent atrial fibrillation and has declined oral anticoagulation. She also walks with a walker with a history of falls.   She was recently admitted 6/21-6/23/19 for exertional chest pain. Repeat echo 6/22 showed EF 55-60%, severe aortic stenosis with mild AR and mildly dilated aortic root; mean/peak gradient 31/69 mmHg, AVA 1.1 cm2, DVI 0.42.  Dr.Croitoru saw her during this admission. He felt ECG and cardiac biomarkers were low risk, but did think her valve was extremely thickened and moderately calcified with marked restriction in leaflet excursion. He recommended discharge with outpatient nuclear stress testing and possible work up for TAVR.   Before she could make it to her stress test she returned to the [facility] ED 03/03/18 with recurrent chest pain. She had chest pressure, shortness of breath and diaphoresis coming out of the beauty shop. It was similar to her previous symptoms from last admission. Her CP resolved by the time she got to the ER. Telemetry showed persistent atrial fib with intermittant bradycardia into upper 30s. Trop 0.03 regular and 0.02 POC, BNP 110, Cr  1.26 (previously 1.07), CBC OK. She was admitted. Troponin has remained 0.3--> 0.3--> 0.3. She was set up for [facility] which was completed today and revealed heavily calcified native coronary arteries with severe disease in the distal RCA-RPDA (best treated medically due to extensive calcification and distal nature of disease) as well as moderate to severe AS with a heavily calcified valve and moderate combined pulmonary hypertension with elevated LVEDP.  Upon further discussion with [name] and her two daughters, she has significant dementia and very limited physical mobility.  Her daughter lives with her and her husband (who also suffers from dementia) and takes care of all of her cooking and cleaning.  She is no longer to do any of her own ADLs.  She walks with a walker and is unsteady on her feet.  The most that she does on a daily basis is walk from the couch to the bathroom into the kitchen to eat.   History from [name] is limited.  She appears to have very little insight into her deteriorating physical and cognitive abilities.  She states that she is not limited at all and that she chooses to sit in her house all day "because she is 84 years old."  She said she was totally fine until last week when she started experiencing lower extremity edema and chest pain.  She denies significant shortness of breath, but her daughters say that she has visible dyspnea when doing even minimal physical activity.  She denies dizziness or syncope, orthopnea or PND.  Past Medical History:  Diagnosis Date  . Ascending aorta  dilatation (HCC)    mild, echo, February, 2009  . Asthma   . Blindness of left eye    related to some type of stroke in the past  . CAD (coronary artery disease)    a. stents right coronary artery 2001.   . CKD (chronic kidney disease), stage III ([place])   . Colon polyp   . Diverticulosis   . DM (diabetes mellitus) ([place])   . Dyslipidemia   . Falling episodes   . HTN (hypertension)     . Kidney stones   . Obesity   . Persistent atrial fibrillation (HCC)    a. pt declined anticoag. also hx of falls.  . PUD (peptic ulcer disease)   . Severe aortic stenosis   . UTI (urinary tract infection)   . Ventral hernia     Past Surgical History:  Procedure Laterality Date  . APPENDECTOMY    . ESOPHAGOGASTRODUODENOSCOPY N/A 11/07/2013   Procedure: ESOPHAGOGASTRODUODENOSCOPY (EGD);  Surgeon: Lafayette Dragon, MD;  Location: [facility];  Service: Endoscopy;  Laterality: N/A;  . TONSILLECTOMY    . TOTAL ABDOMINAL HYSTERECTOMY       Inpatient Medications: Scheduled Meds: . [MAR Hold] ALPRAZolam  1 mg Oral QHS  . [MAR Hold] aspirin EC  81 mg Oral Daily  . [MAR Hold] atorvastatin  10 mg Oral Q M,W,F  . [MAR Hold] heparin  5,000 Units Subcutaneous Q8H  . [MAR Hold] insulin aspart  0-15 Units Subcutaneous TID WC  . [MAR Hold] loratadine  10 mg Oral Daily  . [MAR Hold] losartan  100 mg Oral Daily  . [MAR Hold] oxybutynin  10 mg Oral Q breakfast  . [MAR Hold] psyllium  1 packet Oral Q breakfast  . [MAR Hold] sodium chloride flush  3 mL Intravenous Q12H  . sodium chloride flush  3 mL Intravenous Q12H   Continuous Infusions: . [MAR Hold] sodium chloride    . sodium chloride    . sodium chloride 1 mL/kg/hr (03/05/18 0847)  . heparin     PRN Meds: [MAR Hold] sodium chloride, sodium chloride, [MAR Hold] acetaminophen, [MAR Hold] albuterol, [MAR Hold] amitriptyline, fentaNYL, heparin, lidocaine (PF), midazolam, [MAR Hold] ondansetron (ZOFRAN) IV, Radial Cocktail/Verapamil only, [MAR Hold] sodium chloride flush, sodium chloride flush, [MAR Hold] sorbitol  Allergies:    Allergies  Allergen Reactions  . Latex Rash    Social History:   Social History   Socioeconomic History  . Marital status: Married    Spouse name: Not on file  . Number of children: 2  . Years of education: Not on file  . Highest education level: Not on file  Occupational History  . Occupation: retired     Fish farm manager: RETIRED  Social Needs  . Financial resource strain: Not on file  . Food insecurity:    Worry: Not on file    Inability: Not on file  . Transportation needs:    Medical: Not on file    Non-medical: Not on file  Tobacco Use  . Smoking status: Never Smoker  . Smokeless tobacco: Never Used  Substance and Sexual Activity  . Alcohol use: No  . Drug use: No  . Sexual activity: Yes  Lifestyle  . Physical activity:    Days per week: Not on file    Minutes per session: Not on file  . Stress: Not on file  Relationships  . Social connections:    Talks on phone: Not on file    Gets together: Not on file  Attends religious service: Not on file    Active member of club or organization: Not on file    Attends meetings of clubs or organizations: Not on file    Relationship status: Not on file  . Intimate partner violence:    Fear of current or ex partner: Not on file    Emotionally abused: Not on file    Physically abused: Not on file    Forced sexual activity: Not on file  Other Topics Concern  . Not on file  Social History Narrative  . Not on file    Family History:   The patient's family history includes Colon cancer in her mother; Diabetes in her daughter and sister; Heart attack in her father. There is no history of Hypertension or Stroke.  ROS:  Please see the history of present illness.  ROS  All other ROS reviewed and negative.     Physical Exam/Data:   Vitals:   03/04/18 2330 03/05/18 0300 03/05/18 0748 03/05/18 1045  BP:  (!) 124/55 (!) 146/70   Pulse:   78   Resp:  19 20   Temp:  97.8 F (36.6 C) 97.6 F (36.4 C)   TempSrc:  Oral Oral   SpO2: 97% 98% 96% 98%  Weight:  188 lb 7.9 oz (85.5 kg)    Height:  4\' 11"  (1.499 m)      Intake/Output Summary (Last 24 hours) at 03/05/2018 1133 Last data filed at 03/05/2018 0929 Gross per 24 hour  Intake 1824.76 ml  Output 510 ml  Net 1314.76 ml   Filed Weights   03/04/18 0100 03/05/18 0300  Weight: 187 lb  13.3 oz (85.2 kg) 188 lb 7.9 oz (85.5 kg)   Body mass index is 38.07 kg/m.  General: morbidly obese white female HEENT: normal Lymph: no adenopathy Neck: no JVD Endocrine:  No thryomegaly Vascular: No carotid bruits; FA pulses 2+ bilaterally without bruits  Cardiac:  normal S1, S2; RR; brady, 4/6 SEM @ RUSB, Lungs:  clear to auscultation bilaterally, no wheezing, rhonchi or rales  Abd: soft, nontender, no hepatomegaly  Ext: 1+ bilateral LE edema Musculoskeletal:  No deformities, BUE and BLE strength normal and equal Skin: warm and dry  Neuro:  CNs 2-12 intact, no focal abnormalities noted Psych:  Normal affect   EKG:  The EKG was personally reviewed and demonstrates:  Atrial fib, HR 59 Telemetry:  Telemetry was personally reviewed and demonstrates: atrial fib with slow VR  Relevant CV Studies: 2D ECHO 02/20/18 Study Conclusions - Left ventricle: The cavity size was normal. Wall thickness was   increased in a pattern of severe LVH. Systolic function was   normal. The estimated ejection fraction was in the range of 55%   to 60%. Wall motion was normal; there were no regional wall   motion abnormalities. - Aortic valve: Valve mobility was severely restricted.   Transvalvular velocity was increased. There was severe stenosis.   There was mild regurgitation. Valve area (VTI): 1.06 cm^2. Valve   area (Vmax): 1.02 cm^2. Valve area (Vmean): 1.1 cm^2. - Aortic root: The aortic root was mildly dilated. - Mitral valve: Mildly calcified annulus. There was mild   regurgitation. - Left atrium: The atrium was moderately dilated. - Pulmonary arteries: Systolic pressure was mildly to moderately   increased. PA peak pressure: 41 mm Hg (S).  [facility] 03/05/18 Conclusion    Hemodynamic findings consistent with moderate pulmonary hypertension.  LV end diastolic pressure is severely elevated.  Heavily calcified coronary  arteries with essentially the entire RCA circumferential calcification.  The proximal portions of the LAD and circumflex also have almost circumferential calcification.  Dist - apical LAD lesion is 70% stenosed.  Small Caliber 1st Diag: Ost 1st Diag to 1st Diag lesion is 65% stenosed.  Small Caliber 2nd Diag: Ost 2nd Diag lesion is 40% stenosed. 2nd Diag lesion is 75% stenosed.  Prox Cx lesion is 50% stenosed.  Ost RCA to Prox RCA lesion is 45% stenosed. Prox RCA lesion is 50% stenosed.  Dist RCA-1 lesion is 75% stenosed. Dist RCA-2 lesion is 40% stenosed with 95% stenosed side branch in Ost RPDA. RPDA lesion is 85% stenosed.    Hemodynamically moderate to severe aortic stenosis with heavily calcified aortic valve.  Moderate combined pulmonary hypertension with elevated LVEDP  Heavily calcified native coronary arteries with minimal disease in the LAD and circumflex system.  Severe disease in the distal RCA-RPDA (best treated medically due to extensive calcification and distal nature of disease)   Plan: return to nursing unit after sheath removal in PACU Holding area She will be seen by Dr. Burt Knack from TAVR team to discuss options. RCA lesions are potential culprits for angina, but would be challenging PCI targets & would require femoral access (also potential atherectomy).       Laboratory Data:  Chemistry Recent Labs  Lab 03/03/18 1614 03/05/18 0301  NA 140 140  K 4.3 4.2  CL 98 102  CO2 31 33*  GLUCOSE 124* 198*  BUN 20 22  CREATININE 1.26* 1.18*  CALCIUM 10.0 9.0  GFRNONAA 37* 40*  GFRAA 42* 46*  ANIONGAP 11 5    Recent Labs  Lab 03/03/18 1614  PROT 6.6  ALBUMIN 3.3*  AST 27  ALT 12  ALKPHOS 56  BILITOT 0.7   Hematology Recent Labs  Lab 03/03/18 1614 03/05/18 0301  WBC 8.7 9.6  RBC 4.95 4.75  HGB 12.4 12.0  HCT 41.3 39.5  MCV 83.4 83.2  MCH 25.1* 25.3*  MCHC 30.0 30.4  RDW 17.7* 17.4*  PLT 346 315   Cardiac Enzymes Recent Labs  Lab 03/03/18 1614 03/04/18 0233 03/04/18 0800 03/04/18 1434  TROPONINI  0.03* 0.03* 0.03* 0.03*    Recent Labs  Lab 03/03/18 1755  TROPIPOC 0.02    BNP Recent Labs  Lab 03/03/18 1614  BNP 110.0*    DDimer No results for input(s): DDIMER in the last 168 hours.  Radiology/Studies:  Dg Chest Portable 1 View  Result Date: 03/03/2018 CLINICAL DATA:  Acute chest pain. EXAM: PORTABLE CHEST 1 VIEW COMPARISON:  02/19/2018 FINDINGS: The cardiomediastinal silhouette is unremarkable. Interstitial prominence again noted. There is no evidence of focal airspace disease, pulmonary edema, suspicious pulmonary nodule/mass, pleural effusion, or pneumothorax. No acute bony abnormalities are identified. Remote RIGHT humeral fracture again noted. IMPRESSION: No acute abnormality. Electronically Signed   By: Margarette Canada M.D.   On: 03/03/2018 16:21     STS Risk Calculator: Procedure: AV Replacement   Risk of Mortality:  8.662%   Renal Failure:  8.192%   Permanent Stroke:  2.776%   Prolonged Ventilation:  16.522%   DSW Infection:  0.254%   Reoperation:  2.698%   Morbidity or Mortality:  24.528%   Short Length of Stay:  10.832%   Long Length of Stay:  18.966%     Assessment and Plan:   QUANIYA DAMAS is a 84 y.o. female with symptoms of severe, stage D aortic stenosis with NYHA Class II symptoms of chest pain  and shortness of breath. I have personally reviewed the patient's recent echocardiogram which is notable for normal LV systolic function and mod-severe aortic stenosis with peak gradient of 69 and mean transvalvular gradient of 31. The patient's dimensionless index is 0.42 and calculated aortic valve area is 1.1 cm. L/RHC today revealed heavily calcified native coronary arteries with severe disease in the distal RCA-RPDA (best treated medically due to extensive calcification and distal nature of disease) as well as moderate to severe AS with a heavily calcified valve and moderate combined pulmonary hypertension with elevated LVEDP.  I have reviewed the natural  history of aortic stenosis with the patient. We have discussed the limitations of medical therapy and the poor prognosis associated with symptomatic aortic stenosis. We have reviewed potential treatment options, including palliative medical therapy, conventional surgical aortic valve replacement, and transcatheter aortic valve replacement. We discussed treatment options in the context of this patient's specific comorbid medical conditions.   The patient's predicted risk of mortality with conventional aortic valve replacement is 8.66% primarily based on her age, CAD, permanent atrial fibrillation, HTN, CKD, DMT2 and obesity. Other significant comorbid conditions include significant dementia, deconditioning and gait instability . TAVR is a potential treatment option, but needs to be considered carefully in the context of this patient's dementia and physical deconditioning.  She also has significant coronary artery disease which could be contributing to her chest pain. The patient is clear that she does not want to proceed forward with tests and procedures if it will not improve her quality of life.  Dr. Burt Knack and I had a long discussion with her daughters and her grandson who works at [employer] in the cardiac ICU.  We will discuss the case with the multidisciplinary valve team and decide on the best course of treatment.  Signed, Angelena Form, PA-C  03/05/2018 11:33 AM  Patient seen, examined. Available data reviewed. Agree with findings, assessment, and plan as outlined by Nell Range, PA-C. On my exam on the patient in the cardiac cath lab recovery area, she is elderly, awake and alert, in NAD. JVP is elevated, lung fields are clear, heart is irregular with a late peaking harsh 3/6 systolic murmur herd throughout the precordium with diminished A2, no diastolic murmur. Abdomen is soft and obese. Extremities have trace pretibial edema, neurologic is grossly intact. I have personally reviewed her cardiac  catheterization and echo data including the study images. The coronary arteries are diffusely calcified with severe stenosis in a small vessel pattern in the diagonal and PDA/PLA branches. There is moderate-severe distal RCA stenosis also with severe associated calcification. The LAD and circumflex are widely patent. Mean transaortic gradient is 29 mmHg with calculated AVA 0.9 square cm. Echo data as outlined above, with severely calcified, restricted aortic valve leaflets and doppler data c/w moderately severe AS.   The patient has NYHA III symptoms of chronic diastolic heart failure in the setting of what appears to be severe, Stage D, aortic stenosis. She has at least mild dementia and significantly reduced functional capacity. At the time of my interview, she denies shortness of breath but her family contradicts this. I have discussed treatment options at length with her 2 daughters in person as well as her grandson who is a former CCU nurse at [employer], now training in anesthesia. I have reviewed the natural hx of aortic stenosis with them, and potential treatment options. They understand she would not be considered a candidate for conventional heart surgery under any circumstances. Treatment options are limited  to TAVR or palliative medical therapy. I have concerns about the patient's underlying dementia and what appears to be markedly reduced functional capacity. However, her grandson feels she is still capable of participating in the decision making process.  The family will discuss options further and I will reassess the patient over the weekend to discuss TAVR again with her. At the time of my interview today, history is somewhat limited as she is recovering from cardiac cath.    Sherren Mocha, M.D. 03/06/2018 6:59 AM

## 2018-03-05 NOTE — Progress Notes (Signed)
Progress Note  Patient Name: Cassidy [name] Date of Encounter: 03/05/2018  Primary Cardiologist: Larae Grooms, MD   Subjective   Presented with recurrent chest pain symptoms on 7/3. Today denies chest pain but notes she has not been active at all since hospitalization. Family in room, states that her symptoms have made it unmanageable for her at home. Agree with [facility] today for further evaluation and hope that she is a candidate for TAVR.   Inpatient Medications    Scheduled Meds: . ALPRAZolam  1 mg Oral QHS  . [START ON 03/06/2018] aspirin EC  81 mg Oral Daily  . atorvastatin  10 mg Oral Q M,W,F  . heparin  5,000 Units Subcutaneous Q8H  . insulin aspart  0-15 Units Subcutaneous TID WC  . loratadine  10 mg Oral Daily  . losartan  100 mg Oral Daily  . oxybutynin  10 mg Oral Q breakfast  . psyllium  1 packet Oral Q breakfast  . sodium chloride flush  3 mL Intravenous Q12H  . sodium chloride flush  3 mL Intravenous Q12H   Continuous Infusions: . sodium chloride    . sodium chloride 75 mL/hr at 03/05/18 1326  . sodium chloride     PRN Meds: sodium chloride, sodium chloride, acetaminophen, albuterol, amitriptyline, ondansetron (ZOFRAN) IV, sodium chloride flush, sodium chloride flush, sorbitol   Vital Signs    Vitals:   03/05/18 1400 03/05/18 1405 03/05/18 1410 03/05/18 1422  BP: (!) 135/42 (!) 150/57 (!) 131/43 (!) 127/45  Pulse: (!) 58 62 (!) 59 (!) 56  Resp: 16 18 11 19   Temp:      TempSrc:      SpO2: 99% 99% 100% 100%  Weight:      Height:        Intake/Output Summary (Last 24 hours) at 03/05/2018 1507 Last data filed at 03/05/2018 1445 Gross per 24 hour  Intake 1683.51 ml  Output 310 ml  Net 1373.51 ml   Filed Weights   03/04/18 0100 03/05/18 0300  Weight: 187 lb 13.3 oz (85.2 kg) 188 lb 7.9 oz (85.5 kg)    Telemetry    Slow atrial fibrillation - Personally Reviewed  ECG    No new since 7/3  Physical Exam   GEN: No acute distress.  Pleasant,  well nourished. Neck: supple, no JVD Cardiac: slow irregularly irregular S1, no rubs, or gallops. 2/6 SEM late peaking without audible S2 Respiratory: Clear to auscultation bilaterally. GI: Soft, nontender, non-distended. Bowel sounds normal MS: 1+ bilateral lower extremity edema; No deformity. Neuro:  Nonfocal, moves all limbs independently Psych: Normal affect   Labs    Chemistry Recent Labs  Lab 03/03/18 1614 03/05/18 0301  NA 140 140  K 4.3 4.2  CL 98 102  CO2 31 33*  GLUCOSE 124* 198*  BUN 20 22  CREATININE 1.26* 1.18*  CALCIUM 10.0 9.0  PROT 6.6  --   ALBUMIN 3.3*  --   AST 27  --   ALT 12  --   ALKPHOS 56  --   BILITOT 0.7  --   GFRNONAA 37* 40*  GFRAA 42* 46*  ANIONGAP 11 5     Hematology Recent Labs  Lab 03/03/18 1614 03/05/18 0301  WBC 8.7 9.6  RBC 4.95 4.75  HGB 12.4 12.0  HCT 41.3 39.5  MCV 83.4 83.2  MCH 25.1* 25.3*  MCHC 30.0 30.4  RDW 17.7* 17.4*  PLT 346 315    Cardiac Enzymes Recent Labs  Lab 03/03/18 1614 03/04/18 0233 03/04/18 0800 03/04/18 1434  TROPONINI 0.03* 0.03* 0.03* 0.03*    Recent Labs  Lab 03/03/18 1755  TROPIPOC 0.02     BNP Recent Labs  Lab 03/03/18 1614  BNP 110.0*     DDimer No results for input(s): DDIMER in the last 168 hours.   Radiology    Dg Chest Portable 1 View  Result Date: 03/03/2018 CLINICAL DATA:  Acute chest pain. EXAM: PORTABLE CHEST 1 VIEW COMPARISON:  02/19/2018 FINDINGS: The cardiomediastinal silhouette is unremarkable. Interstitial prominence again noted. There is no evidence of focal airspace disease, pulmonary edema, suspicious pulmonary nodule/mass, pleural effusion, or pneumothorax. No acute bony abnormalities are identified. Remote RIGHT humeral fracture again noted. IMPRESSION: No acute abnormality. Electronically Signed   By: Margarette Canada M.D.   On: 03/03/2018 16:21    Cardiac Studies   Echo 02/20/18 Study Conclusions  - Left ventricle: The cavity size was normal. Wall  thickness was   increased in a pattern of severe LVH. Systolic function was   normal. The estimated ejection fraction was in the range of 55%   to 60%. Wall motion was normal; there were no regional wall   motion abnormalities. - Aortic valve: Valve mobility was severely restricted.   Transvalvular velocity was increased. There was severe stenosis.   There was mild regurgitation. Valve area (VTI): 1.06 cm^2. Valve   area (Vmax): 1.02 cm^2. Valve area (Vmean): 1.1 cm^2. - Aortic root: The aortic root was mildly dilated. - Mitral valve: Mildly calcified annulus. There was mild   regurgitation. - Left atrium: The atrium was moderately dilated. - Pulmonary arteries: Systolic pressure was mildly to moderately   increased. PA peak pressure: 41 mm Hg (S).  Cath today:  Hemodynamic findings consistent with moderate pulmonary hypertension.  LV end diastolic pressure is severely elevated.  Heavily calcified coronary arteries with essentially the entire RCA circumferential calcification. The proximal portions of the LAD and circumflex also have almost circumferential calcification.  Dist - apical LAD lesion is 70% stenosed.  Small Caliber 1st Diag: Ost 1st Diag to 1st Diag lesion is 65% stenosed.  Small Caliber 2nd Diag: Ost 2nd Diag lesion is 40% stenosed. 2nd Diag lesion is 75% stenosed.  Prox Cx lesion is 50% stenosed.  Ost RCA to Prox RCA lesion is 45% stenosed. Prox RCA lesion is 50% stenosed.  Dist RCA-1 lesion is 75% stenosed. Dist RCA-2 lesion is 40% stenosed with 95% stenosed side branch in Ost RPDA. RPDA lesion is 85% stenosed.    Hemodynamically moderate to severe aortic stenosis with heavily calcified aortic valve.  Moderate combined pulmonary hypertension with elevated LVEDP  Heavily calcified native coronary arteries with minimal disease in the LAD and circumflex system.  Severe disease in the distal RCA-RPDA (best treated medically due to extensive calcification and  distal nature of disease)   Plan: return to nursing unit after sheath removal in PACU Holding area She will be seen by Dr. Burt Knack from TAVR team to discuss options. RCA lesions are potential culprits for angina, but would be challenging PCI targets & would require femoral access (also potential atherectomy).  Patient Profile     84 y.o. female with recurrent chest pain episodes, with severe aortic stenosis, elevated LVEDP and pulmonary hypertension, and heavily calcified coronaries with severe disease in the distal RCA territory.  Assessment & Plan    Chest pain: may be related to distal RCA disease, but suspect it is due to her severe AS.  -asked  structural team to evaluate her for TAVR given her recurrent symptoms -monitor Cr post cath -medications for CAD, dyslipidemia, hypertension: on aspirin 81 mg (was on 325 at outpatient), on atorvastatin 10 mg, no beta blocker due to baseline bradycardia, on losartan 100 mg  Atrial fibrillation: -slow, on no rate control agents. Would be concerned for complete heart block with TAVR. Monitor  Chronic kidney disease: CrCL 30 today -avoid nephrotoxic agents -LVEDP high on cath but not symptomatic. Will watch kidney function for today, consider diuresis if symptomatic and kidney function stable -if Cr rises hold losartan. Holding HCTZ  Type II diabetes: on SSI, holding metformin   Time Spent Directly with Patient: I have spent a total of >35 minutes with the patient reviewing hospital notes, telemetry, EKGs, labs and examining the patient as well as establishing an assessment and plan that was discussed personally with the patient.  > 50% of time was spent in direct patient care.  Length of Stay:  LOS: 1 day   Buford Dresser, MD, PhD [facility]  [facility]   03/05/2018, 3:07 PM  For questions or updates, please contact [place] Please consult [email or web address] for contact info under Cardiology/STEMI.

## 2018-03-05 NOTE — Progress Notes (Signed)
Site area: rt groin fa sheath Site Prior to Removal:  Level 0 Pressure Applied For: 25 minutes Manual:   yes Patient Status During Pull:  stable Post Pull Site:  Level 0 Post Pull Instructions Given:  yes Post Pull Pulses Present: palpable rt dp Dressing Applied:  Gauze and tegaderm Bedrest begins @ [id number] Comments:

## 2018-03-06 ENCOUNTER — Inpatient Hospital Stay (HOSPITAL_COMMUNITY): Payer: Medicare Other

## 2018-03-06 DIAGNOSIS — R0789 Other chest pain: Secondary | ICD-10-CM

## 2018-03-06 LAB — CBC
HCT: 39.2 % (ref 36.0–46.0)
Hemoglobin: 11.6 g/dL — ABNORMAL LOW (ref 12.0–15.0)
MCH: 25.2 pg — AB (ref 26.0–34.0)
MCHC: 29.6 g/dL — ABNORMAL LOW (ref 30.0–36.0)
MCV: 85.2 fL (ref 78.0–100.0)
PLATELETS: 323 10*3/uL (ref 150–400)
RBC: 4.6 MIL/uL (ref 3.87–5.11)
RDW: 17.9 % — ABNORMAL HIGH (ref 11.5–15.5)
WBC: 9.4 10*3/uL (ref 4.0–10.5)

## 2018-03-06 LAB — BASIC METABOLIC PANEL
Anion gap: 7 (ref 5–15)
BUN: 18 mg/dL (ref 8–23)
CALCIUM: 8.9 mg/dL (ref 8.9–10.3)
CHLORIDE: 103 mmol/L (ref 98–111)
CO2: 30 mmol/L (ref 22–32)
CREATININE: 1.09 mg/dL — AB (ref 0.44–1.00)
GFR calc non Af Amer: 44 mL/min — ABNORMAL LOW (ref >60)
GFR, EST AFRICAN AMERICAN: 51 mL/min — AB (ref >60)
Glucose, Bld: 188 mg/dL — ABNORMAL HIGH (ref 70–99)
Potassium: 4.3 mmol/L (ref 3.5–5.1)
SODIUM: 140 mmol/L (ref 135–145)

## 2018-03-06 LAB — GLUCOSE, CAPILLARY
GLUCOSE-CAPILLARY: 172 mg/dL — AB (ref 70–99)
GLUCOSE-CAPILLARY: 195 mg/dL — AB (ref 70–99)
Glucose-Capillary: 164 mg/dL — ABNORMAL HIGH (ref 70–99)

## 2018-03-06 NOTE — Progress Notes (Signed)
Some confusion noted in evening prior to dinner tray. Pt calling out for her daughter Cassidy [name] and asking had I seen her husband. Easily redirected and reorientated to her earlier phone call with her daughter. Will continue to monitor.

## 2018-03-06 NOTE — Progress Notes (Signed)
11:34 RN responded to call for help. Pt stated she slid to floor . No visible injuries. MD and family notified. Pt assisted back to bed. She reports no pain. Pt currently sleeping. RN will wake her hourly on check.

## 2018-03-06 NOTE — Progress Notes (Signed)
Floor matts placed.  Right groin level 1 with marked swelling and bruising. VS stable.   Will continue to monitor [facility] T

## 2018-03-06 NOTE — Progress Notes (Signed)
Progress Note  Patient Name: Cassidy [name] Date of Encounter: 03/06/2018  Primary Cardiologist: Larae Grooms, MD   Subjective   She says she slept in bed last night.  She is in recliner at bedside.  She is arousable but easily falls back to sleep.  States that she has been able to ambulate since admission to the hospital.  Denies chest pain.  Seen for evaluation and consideration of TAVR.  No decision has been made yet.  Inpatient Medications    Scheduled Meds: . ALPRAZolam  1 mg Oral QHS  . aspirin EC  81 mg Oral Daily  . atorvastatin  10 mg Oral Q M,W,F  . heparin  5,000 Units Subcutaneous Q8H  . insulin aspart  0-15 Units Subcutaneous TID WC  . loratadine  10 mg Oral Daily  . losartan  100 mg Oral Daily  . oxybutynin  10 mg Oral Q breakfast  . psyllium  1 packet Oral Q breakfast  . sodium chloride flush  3 mL Intravenous Q12H  . sodium chloride flush  3 mL Intravenous Q12H   Continuous Infusions: . sodium chloride    . sodium chloride     PRN Meds: sodium chloride, sodium chloride, acetaminophen, albuterol, amitriptyline, nystatin, ondansetron (ZOFRAN) IV, sodium chloride flush, sodium chloride flush, sorbitol   Vital Signs    Vitals:   03/05/18 2115 03/05/18 2257 03/06/18 0344 03/06/18 0713  BP: 114/61 (!) 130/54 (!) 126/58 (!) 127/50  Pulse: (!) 57 70 77 76  Resp: 17 (!) 24 (!) 22 19  Temp: 98.7 F (37.1 C) 99.5 F (37.5 C) 98.3 F (36.8 C) 98.8 F (37.1 C)  TempSrc: Oral Axillary Oral Oral  SpO2: 100% 100% 96%   Weight:   189 lb 2.5 oz (85.8 kg)   Height:        Intake/Output Summary (Last 24 hours) at 03/06/2018 0917 Last data filed at 03/06/2018 0814 Gross per 24 hour  Intake 1106.33 ml  Output 353 ml  Net 753.33 ml   Filed Weights   03/04/18 0100 03/05/18 0300 03/06/18 0344  Weight: 187 lb 13.3 oz (85.2 kg) 188 lb 7.9 oz (85.5 kg) 189 lb 2.5 oz (85.8 kg)    Telemetry    Fibrillation with controlled rate- Personally Reviewed  ECG      No new tracing- Personally Reviewed  Physical Exam  Obese, elderly, and chronically ill appearing GEN: No acute distress.   Neck: No JVD Cardiac: RRR, there is a 3/6 crescendo decrescendo systolic murmur compatible with aortic stenosis at right upper sternal border and left lower to mid sternal border.  No rubs, or gallops.  Respiratory: Clear to auscultation bilaterally. GI: Soft, nontender, non-distended  MS: No edema; No deformity. Neuro:  Lethargic but able to follow commands Psych: [place]  Lab 03/03/18 1614 03/05/18 0301 03/06/18 0232  NA 140 140 140  K 4.3 4.2 4.3  CL 98 102 103  CO2 31 33* 30  GLUCOSE 124* 198* 188*  BUN 20 22 18   CREATININE 1.26* 1.18* 1.09*  CALCIUM 10.0 9.0 8.9  PROT 6.6  --   --   ALBUMIN 3.3*  --   --   AST 27  --   --   ALT 12  --   --   ALKPHOS 56  --   --   BILITOT 0.7  --   --   GFRNONAA 37* 40* 44*  GFRAA 42* 46* 51*  ANIONGAP 11 5 7      Hematology Recent Labs  Lab 03/03/18 1614 03/05/18 0301 03/06/18 0232  WBC 8.7 9.6 9.4  RBC 4.95 4.75 4.60  HGB 12.4 12.0 11.6*  HCT 41.3 39.5 39.2  MCV 83.4 83.2 85.2  MCH 25.1* 25.3* 25.2*  MCHC 30.0 30.4 29.6*  RDW 17.7* 17.4* 17.9*  PLT 346 315 323    Cardiac Enzymes Recent Labs  Lab 03/03/18 1614 03/04/18 0233 03/04/18 0800 03/04/18 1434  TROPONINI 0.03* 0.03* 0.03* 0.03*    Recent Labs  Lab 03/03/18 1755  TROPIPOC 0.02     BNP Recent Labs  Lab 03/03/18 1614  BNP 110.0*     DDimer No results for input(s): DDIMER in the last 168 hours.   Radiology    No results found.  Cardiac Studies   Cardiac cath 03/05/2018: Coronary Diagrams   Diagnostic Diagram         Hemodynamically moderate to severe aortic stenosis with heavily calcified aortic valve.  Moderate combined pulmonary hypertension with elevated LVEDP  Heavily calcified native coronary arteries with minimal disease in the LAD and circumflex system.  Severe  disease in the distal RCA-RPDA (best treated medically due to extensive calcification and distal nature of disease)  2D Doppler echocardiogram 02/20/2018: Study Conclusions  - Left ventricle: The cavity size was normal. Wall thickness was   increased in a pattern of severe LVH. Systolic function was   normal. The estimated ejection fraction was in the range of 55%   to 60%. Wall motion was normal; there were no regional wall   motion abnormalities. - Aortic valve: Valve mobility was severely restricted.   Transvalvular velocity was increased. There was severe stenosis.   There was mild regurgitation. Valve area (VTI): 1.06 cm^2. Valve   area (Vmax): 1.02 cm^2. Valve area (Vmean): 1.1 cm^2. - Aortic root: The aortic root was mildly dilated. - Mitral valve: Mildly calcified annulus. There was mild   regurgitation. - Left atrium: The atrium was moderately dilated. - Pulmonary arteries: Systolic pressure was mildly to moderately   increased. PA peak pressure: 41 mm Hg (S).    Patient Profile     84 y.o. female with a hx of CAD s/p remote stenting to RCA (2001), permanent atrial fib (not on [place]), HTN, DMT2, HLD, CKD, aortic dilation, dementia and severe aortic stenosis who is being considered for TAVR versus palliative management of aortic stenosis and other significant comorbidities including dementia and decreased exertional ability..  Assessment & Plan    1. Severe aortic stenosis, documented by echocardiography and catheterization.  Being evaluated for TAVR.  Not a surgical candidate due to comorbidities including dementia and age. 2. Moderately severe right coronary disease best treated with medical therapy 3. Atrial fibrillation with controlled rate on no medications that slow AV conduction.  Agree she would be at heightened risk for permanent pacemaker if TAVR performed. 4. CKD was stable creatinine post contrast exposure.  She is being considered for TAVR by the  interdisciplinary heart valve team.  Further recommendations to follow.  Clinically stable this morning from general cardiology and post cath perspective.     For questions or updates, please contact [place] Please consult [email or web address] for contact info under Cardiology/STEMI.      Signed, Sinclair Grooms, MD  03/06/2018, 9:17 AM

## 2018-03-06 NOTE — Progress Notes (Signed)
  [facility]ULTIDISCIPLINARY HEART VALVE TEAM  Follow-up discussion with patient regarding TAVR.  The patient is alone in her room today.  Notes she fell out of the chair earlier but did not seem to have any significant injuries.  She is scheduled for a few x-rays to make sure she did not have a fracture of her hip or shoulder.  I spoke with the patient about her aortic valve disease.  She understands that she has severe aortic stenosis which can cause chest pain, shortness of breath, and progressive symptoms of heart failure.  She is very clear in stating that she does not wish to undergo any further testing for heart valve replacement and specifically does not want to undergo heart valve replacement even if it is done in minimally invasive fashion.  She states "I am almost 84 years old and I am ready when God tells me it is my time."  I will let the family know that the patient does not wish to undergo further TAVR evaluation.  I think her decision is very reasonable and I have been skeptical that she would get much benefit from TAVR considering her poor functional capacity.  Sherren Mocha 03/06/2018 2:23 PM

## 2018-03-06 NOTE — Progress Notes (Signed)
   Called by RN. Patient fell out of chair earlier.  Vital signs stable. No loss of consciousness. Patient now resting in bed. She notes pain in her R hip and R shoulder. Cath site appears stable.  Dressing intact.  No R groin hematoma.  + ecchymosis (stable per RN).  No R FA bruit. PLAN:  1. 1v R hip xray and pelvis - rule out fracture 2. 1v R shoulder xray - rule out fracture 3. RN submitted safety zone portal earlier today. Richardson Dopp, PA-C    03/06/2018 1:25 PM

## 2018-03-07 ENCOUNTER — Encounter (HOSPITAL_COMMUNITY): Payer: Self-pay | Admitting: Physician Assistant

## 2018-03-07 DIAGNOSIS — R296 Repeated falls: Secondary | ICD-10-CM

## 2018-03-07 DIAGNOSIS — I25119 Atherosclerotic heart disease of native coronary artery with unspecified angina pectoris: Secondary | ICD-10-CM

## 2018-03-07 LAB — BASIC METABOLIC PANEL
Anion gap: 4 — ABNORMAL LOW (ref 5–15)
BUN: 23 mg/dL (ref 8–23)
CHLORIDE: 102 mmol/L (ref 98–111)
CO2: 31 mmol/L (ref 22–32)
Calcium: 8.8 mg/dL — ABNORMAL LOW (ref 8.9–10.3)
Creatinine, Ser: 1.23 mg/dL — ABNORMAL HIGH (ref 0.44–1.00)
GFR calc Af Amer: 44 mL/min — ABNORMAL LOW (ref >60)
GFR calc non Af Amer: 38 mL/min — ABNORMAL LOW (ref >60)
Glucose, Bld: 203 mg/dL — ABNORMAL HIGH (ref 70–99)
POTASSIUM: 4.6 mmol/L (ref 3.5–5.1)
Sodium: 137 mmol/L (ref 135–145)

## 2018-03-07 LAB — CBC
HEMATOCRIT: 38.1 % (ref 36.0–46.0)
Hemoglobin: 11.3 g/dL — ABNORMAL LOW (ref 12.0–15.0)
MCH: 25.6 pg — AB (ref 26.0–34.0)
MCHC: 29.7 g/dL — AB (ref 30.0–36.0)
MCV: 86.4 fL (ref 78.0–100.0)
Platelets: 277 10*3/uL (ref 150–400)
RBC: 4.41 MIL/uL (ref 3.87–5.11)
RDW: 17.6 % — AB (ref 11.5–15.5)
WBC: 9.9 10*3/uL (ref 4.0–10.5)

## 2018-03-07 LAB — GLUCOSE, CAPILLARY
GLUCOSE-CAPILLARY: 138 mg/dL — AB (ref 70–99)
Glucose-Capillary: 184 mg/dL — ABNORMAL HIGH (ref 70–99)
Glucose-Capillary: 271 mg/dL — ABNORMAL HIGH (ref 70–99)
Glucose-Capillary: 287 mg/dL — ABNORMAL HIGH (ref 70–99)

## 2018-03-07 NOTE — Progress Notes (Signed)
Progress Note  Patient Name: Cassidy [name] Date of Encounter: 03/07/2018  Primary Cardiologist: Larae Grooms, MD   Subjective   Prefers conservative medical management of aortic valve disease.  May need hospice and palliative care evaluation to aligned goals of care.  Inpatient Medications    Scheduled Meds: . ALPRAZolam  1 mg Oral QHS  . aspirin EC  81 mg Oral Daily  . atorvastatin  10 mg Oral Q M,W,F  . heparin  5,000 Units Subcutaneous Q8H  . insulin aspart  0-15 Units Subcutaneous TID WC  . loratadine  10 mg Oral Daily  . losartan  100 mg Oral Daily  . oxybutynin  10 mg Oral Q breakfast  . psyllium  1 packet Oral Q breakfast  . sodium chloride flush  3 mL Intravenous Q12H  . sodium chloride flush  3 mL Intravenous Q12H   Continuous Infusions: . sodium chloride    . sodium chloride     PRN Meds: sodium chloride, sodium chloride, acetaminophen, albuterol, amitriptyline, nystatin, ondansetron (ZOFRAN) IV, sodium chloride flush, sodium chloride flush, sorbitol   Vital Signs    Vitals:   03/06/18 2319 03/07/18 0327 03/07/18 0800 03/07/18 1142  BP: (!) 142/55 (!) 133/44 130/62 (!) 152/46  Pulse: 76 70 66 68  Resp: (!) 22 (!) 21 (!) 21 (!) 22  Temp: 98.6 F (37 C) 98.3 F (36.8 C) (!) 97 F (36.1 C) (!) 97.1 F (36.2 C)  TempSrc: Oral Axillary Axillary Oral  SpO2: 100% 100% 97% 100%  Weight:  194 lb 10.7 oz (88.3 kg)    Height:        Intake/Output Summary (Last 24 hours) at 03/07/2018 1204 Last data filed at 03/07/2018 1053 Gross per 24 hour  Intake 880 ml  Output 402 ml  Net 478 ml   Filed Weights   03/05/18 0300 03/06/18 0344 03/07/18 0327  Weight: 188 lb 7.9 oz (85.5 kg) 189 lb 2.5 oz (85.8 kg) 194 lb 10.7 oz (88.3 kg)    Telemetry    Fibrillation with controlled rate- Personally Reviewed  ECG    No new tracing- Personally Reviewed  Physical Exam  Obese, elderly, and chronically ill appearing GEN: No acute distress.   Neck: No  JVD Cardiac: RRR, there is a 3/6 crescendo decrescendo systolic murmur compatible with aortic stenosis at right upper sternal border and left lower to mid sternal border.  No rubs, or gallops.  Respiratory: Clear to auscultation bilaterally. GI: Soft, nontender, non-distended  MS: No edema; No deformity. Neuro:  Lethargic but able to follow commands Psych: [place]  Lab 03/03/18 1614 03/05/18 0301 03/06/18 0232 03/07/18 0327  NA 140 140 140 137  K 4.3 4.2 4.3 4.6  CL 98 102 103 102  CO2 31 33* 30 31  GLUCOSE 124* 198* 188* 203*  BUN 20 22 18 23   CREATININE 1.26* 1.18* 1.09* 1.23*  CALCIUM 10.0 9.0 8.9 8.8*  PROT 6.6  --   --   --   ALBUMIN 3.3*  --   --   --   AST 27  --   --   --   ALT 12  --   --   --   ALKPHOS 56  --   --   --   BILITOT 0.7  --   --   --   GFRNONAA 37* 40* 44* 38*  GFRAA 42* 46* 51* 44*  ANIONGAP 11 5 7  4*  Hematology Recent Labs  Lab 03/05/18 0301 03/06/18 0232 03/07/18 0327  WBC 9.6 9.4 9.9  RBC 4.75 4.60 4.41  HGB 12.0 11.6* 11.3*  HCT 39.5 39.2 38.1  MCV 83.2 85.2 86.4  MCH 25.3* 25.2* 25.6*  MCHC 30.4 29.6* 29.7*  RDW 17.4* 17.9* 17.6*  PLT 315 323 277    Cardiac Enzymes Recent Labs  Lab 03/03/18 1614 03/04/18 0233 03/04/18 0800 03/04/18 1434  TROPONINI 0.03* 0.03* 0.03* 0.03*    Recent Labs  Lab 03/03/18 1755  TROPIPOC 0.02     BNP Recent Labs  Lab 03/03/18 1614  BNP 110.0*     DDimer No results for input(s): DDIMER in the last 168 hours.   Radiology    Dg Shoulder Right  Result Date: 03/06/2018 CLINICAL DATA:  Pt complains of right shoulder and right hip pain since falling out of her hospital chair this AM EXAM: RIGHT SHOULDER - 2+ VIEW COMPARISON:  Chest x-ray dated 03/03/2018. Plain film of the RIGHT shoulder dated 03/18/2007. FINDINGS: Old healed fracture of the RIGHT humeral neck. No acute fracture or dislocation. Stable diastasis at the acromioclavicular joint space. Soft  tissues about the RIGHT shoulder are unremarkable. IMPRESSION: No acute findings.  Old healed fracture of the RIGHT humeral neck. Electronically Signed   By: Franki Cabot M.D.   On: 03/06/2018 15:00   Dg Hip Unilat With Pelvis 1v Right  Result Date: 03/06/2018 CLINICAL DATA:  Pt complains of right shoulder and right hip pain since falling out of her hospital chair this AM EXAM: DG HIP (WITH OR WITHOUT PELVIS) 1V RIGHT COMPARISON:  None. FINDINGS: Single view of the pelvis and single view of the RIGHT hip are provided. Osseous alignment appears normal. No fracture line or displaced fracture fragment identified. Presumed hernia repair hardware overlying the LEFT groin region. Soft tissues about the pelvis and RIGHT hip are otherwise unremarkable. IMPRESSION: No acute findings.  No osseous fracture or dislocation seen. Electronically Signed   By: Franki Cabot M.D.   On: 03/06/2018 14:59    Cardiac Studies   Cardiac cath 03/05/2018: Coronary Diagrams   Diagnostic Diagram         Hemodynamically moderate to severe aortic stenosis with heavily calcified aortic valve.  Moderate combined pulmonary hypertension with elevated LVEDP  Heavily calcified native coronary arteries with minimal disease in the LAD and circumflex system.  Severe disease in the distal RCA-RPDA (best treated medically due to extensive calcification and distal nature of disease)  2D Doppler echocardiogram 02/20/2018: Study Conclusions  - Left ventricle: The cavity size was normal. Wall thickness was   increased in a pattern of severe LVH. Systolic function was   normal. The estimated ejection fraction was in the range of 55%   to 60%. Wall motion was normal; there were no regional wall   motion abnormalities. - Aortic valve: Valve mobility was severely restricted.   Transvalvular velocity was increased. There was severe stenosis.   There was mild regurgitation. Valve area (VTI): 1.06 cm^2. Valve   area (Vmax): 1.02 cm^2.  Valve area (Vmean): 1.1 cm^2. - Aortic root: The aortic root was mildly dilated. - Mitral valve: Mildly calcified annulus. There was mild   regurgitation. - Left atrium: The atrium was moderately dilated. - Pulmonary arteries: Systolic pressure was mildly to moderately   increased. PA peak pressure: 41 mm Hg (S).    Patient Profile     84 y.o. female with a hx of CAD s/p remote stenting to RCA (2001),  permanent atrial fib (not on [place]), HTN, DMT2, HLD, CKD, aortic dilation, dementia and severe aortic stenosis who is being considered for TAVR versus palliative management of aortic stenosis and other significant comorbidities including dementia and decreased exertional ability..  Assessment & Plan    1. Severe aortic stenosis, patient prefers conservative medical management and clinical outcome as dictated by natural history of her disease process.   2. Moderately severe right coronary disease best treated with medical therapy 3. Atrial fibrillation with controlled rate on no medications that slow AV conduction.  Agree she would be at heightened risk for permanent pacemaker if TAVR performed. 4. CKD was stable creatinine post contrast exposure.  Attempt ambulation.  Determine if she will return home or to skilled nursing.  Currently eligible for discharge once all agree.     For questions or updates, please contact [place] Please consult [email or web address] for contact info under Cardiology/STEMI.      Signed, Sinclair Grooms, MD  03/07/2018, 12:04 PM

## 2018-03-07 NOTE — Progress Notes (Addendum)
   Had family come in to discuss the patient's long-term management.  The patient is asking to go home.  She has been in the bed or recliner the entire admission without ambulating.  The family does not feel she can come back home because they cannot physically handle her.  The patient does not want an escalation in therapy for aortic stenosis.  Even if she had TAVR, she would possibly end up being physically dependent and require skilled nursing facility.  Long and somewhat difficult discussion between patient and family.  The patient fell from her chair yesterday.  The husband who is wheelchair-bound and the daughter who is elderly would not be physically able to assist the patient should this occur at home.  Cancel plans for discharge today.  PT consultation to these determine safety for discharge and recommendations for rehab or permanent skilled nursing facility.  35-minute encounter

## 2018-03-08 LAB — GLUCOSE, CAPILLARY
GLUCOSE-CAPILLARY: 177 mg/dL — AB (ref 70–99)
GLUCOSE-CAPILLARY: 153 mg/dL — AB (ref 70–99)
GLUCOSE-CAPILLARY: 144 mg/dL — AB (ref 70–99)
Glucose-Capillary: 269 mg/dL — ABNORMAL HIGH (ref 70–99)

## 2018-03-08 NOTE — Care Management (Addendum)
Update: Attending group verified pt is not appropriate for [facility] - discharge is for SNF.  CSW aware and working placement  CM text paged attending group for clarity with LTACH order - pt appears to more appropriate for SNF at discharge.

## 2018-03-08 NOTE — Clinical Social Work Note (Signed)
Clinical Social Work Assessment  Patient Details  Name: Cassidy [name] MRN: [id number] Date of Birth: [birth year]  Date of referral:  03/08/18               Reason for consult:  Facility Placement, Discharge Planning                Permission sought to share information with:  Family Supports Permission granted to share information::  Yes, Verbal Permission Granted  Name::     Kaelan Emami  Agency::     Relationship::  Daughter  Contact Information:  [phone]  Housing/Transportation Living arrangements for the past 2 months:  [place] of Information:  Medical Team, Adult Children Patient Interpreter Needed:  None Criminal Activity/Legal Involvement Pertinent to Current Situation/Hospitalization:  No - Comment as needed Significant Relationships:  Adult Children, Spouse Lives with:  Adult Children, Spouse Do you feel safe going back to the place where you live?  Yes Need for family participation in patient care:  Yes (Comment)  Care giving concerns:  PT recommending SNF once medically stable for discharge.   Social Worker assessment / plan:  Patient not fully oriented. No supports at bedside. Per documentation and RN, patient's husband has dementia as well. CSW called patient's daughter, introduced role and explained that PT recommendations would be discussed. Patient's daughter was hurrying to finish errands before buildings closed but asked if CSW could call tonight. CSW explained that hours are only until 4:30 pm and we cannot bring our phones home. CSW offered to email her a SNF list. Patient's daughter replied that if she ever went to a facility it would be [facility] but they were not interested in that right now. Patient's daughter asking about a lift for the home and stated patient "would prefer to die at home." [facility] notified. Patient's daughter asked multiple times who asked CSW to call her. CSW explained that PT recommendations were made for SNF and  that is when the call took place. No further concerns. CSW encouraged patient to contact CSW as needed. CSW will continue to follow patient and her daughter for support and facilitate discharge to facility, if needed, once medically stable.  Employment status:  Retired Forensic scientist:  Medicare PT Recommendations:  [place] / Referral to community resources:  [name]  Patient/Family's Response to care:  Patient not fully oriented. Patient's daughter stated she prefers for her to return home at discharge. Patient's husband and daughter supportive and involved in patient's care. Patient's daughter appreciated social work intervention.  Patient/Family's Understanding of and Emotional Response to Diagnosis, Current Treatment, and Prognosis:  Patient not fully oriented. Patient's daughter has a good understanding of the reason for admission and social work consult. Patient's daughter appears happy with hospital care.  Emotional Assessment Appearance:  Appears stated age Attitude/Demeanor/Rapport:  Unable to Assess Affect (typically observed):  Unable to Assess Orientation:  Oriented to Self, Oriented to Place Alcohol / Substance use:  Never Used Psych involvement (Current and /or in the community):  No (Comment)  Discharge Needs  Concerns to be addressed:  Care Coordination Readmission within the last 30 days:  Yes Current discharge risk:  Cognitively Impaired, Dependent with Mobility Barriers to Discharge:  Continued Medical Work up   Candie Chroman, LCSW 03/08/2018, 4:05 PM

## 2018-03-08 NOTE — Progress Notes (Signed)
Spoke with daughter, she is not completely against SNF, but wish her mother to have more PT in the hospital. I informed her that's very difficult unless she qualify for inpatient rehab. PT recommend SNF for now. Family also express wish that if patient should continue to decline from her condition, she should be at home, however worries that her husband is unable to take care of her.   I will consult palliative care service as she will likely have continuous decline from her valvular issue. Rounding team to discuss with daughter when she arrive tomorrow to initiate palliative/home hosice discussion.   Hilbert Corrigan PA Pager: [phone]

## 2018-03-08 NOTE — Progress Notes (Signed)
Inpatient Diabetes Program Recommendations  AACE/ADA: New Consensus Statement on Inpatient Glycemic Control (2015)  Target Ranges:  Prepandial:   less than 140 mg/dL      Peak postprandial:   less than 180 mg/dL (1-2 hours)      Critically ill patients:  140 - 180 mg/dL   Lab Results  Component Value Date   GLUCAP 177 (H) 03/08/2018   HGBA1C 5.9 (H) 07/23/2016    Review of Glycemic Control Results for Cassidy [name], Cassidy [name] (MRN [id number]) as of 03/08/2018 13:10  Ref. Range 03/07/2018 16:32 03/07/2018 21:14 03/08/2018 07:44 03/08/2018 12:27  Glucose-Capillary Latest Ref Range: 70 - 99 mg/dL 138 (H) 287 (H) 269 (H) 177 (H)   Diabetes history: Type 2 DM Outpatient Diabetes medications: Novolin 70/30 28 units QAM, 15 units QHS, Metformin 500 mg BID Current orders for Inpatient glycemic control: Novolog 0-15 units TID  Inpatient Diabetes Program Recommendations:    Consider adding Novolog 0-5 units QHS.   If FSBS continue to exceed 180 mg/dL, consider adding Levemir 8 units QHS.   Thanks, Bronson Curb, MSN, RNC-OB Diabetes Coordinator [phone] (8a-5p)

## 2018-03-08 NOTE — Progress Notes (Addendum)
Progress Note  Patient Name: Cassidy [name] Date of Encounter: 03/08/2018  Primary Cardiologist: Larae Grooms, MD   Subjective   Sleepy, complained of hospital food. No SOB.   Inpatient Medications    Scheduled Meds: . ALPRAZolam  1 mg Oral QHS  . aspirin EC  81 mg Oral Daily  . atorvastatin  10 mg Oral Q M,W,F  . heparin  5,000 Units Subcutaneous Q8H  . insulin aspart  0-15 Units Subcutaneous TID WC  . loratadine  10 mg Oral Daily  . losartan  100 mg Oral Daily  . oxybutynin  10 mg Oral Q breakfast  . psyllium  1 packet Oral Q breakfast  . sodium chloride flush  3 mL Intravenous Q12H  . sodium chloride flush  3 mL Intravenous Q12H   Continuous Infusions: . sodium chloride    . sodium chloride     PRN Meds: sodium chloride, sodium chloride, acetaminophen, albuterol, amitriptyline, nystatin, ondansetron (ZOFRAN) IV, sodium chloride flush, sodium chloride flush, sorbitol   Vital Signs    Vitals:   03/08/18 0130 03/08/18 0329 03/08/18 0710 03/08/18 1224  BP:  (!) 130/58 (!) 140/57 (!) 115/53  Pulse:  94 76   Resp:  (!) 22  (!) 22  Temp:  98.6 F (37 C) 98.8 F (37.1 C) 98.2 F (36.8 C)  TempSrc:  Oral Axillary Oral  SpO2:  100%    Weight: 194 lb 7.1 oz (88.2 kg)     Height:        Intake/Output Summary (Last 24 hours) at 03/08/2018 1259 Last data filed at 03/08/2018 1000 Gross per 24 hour  Intake 360 ml  Output 100 ml  Net 260 ml   Filed Weights   03/06/18 0344 03/07/18 0327 03/08/18 0130  Weight: 189 lb 2.5 oz (85.8 kg) 194 lb 10.7 oz (88.3 kg) 194 lb 7.1 oz (88.2 kg)    Telemetry    Atrial fibrillation, self rate controlled - Personally Reviewed  ECG    No new EKG   Physical Exam   GEN: sleepy  Neck: No JVD Cardiac: irregularly irregular, no rubs, or gallops. 2/6 systolic murmur at RUSB Respiratory: Clear to auscultation bilaterally. GI: Soft, nontender, non-distended  MS: No edema; No deformity. Neuro:  Nonfocal  Psych: Normal affect     Labs    Chemistry Recent Labs  Lab 03/03/18 1614 03/05/18 0301 03/06/18 0232 03/07/18 0327  NA 140 140 140 137  K 4.3 4.2 4.3 4.6  CL 98 102 103 102  CO2 31 33* 30 31  GLUCOSE 124* 198* 188* 203*  BUN 20 22 18 23   CREATININE 1.26* 1.18* 1.09* 1.23*  CALCIUM 10.0 9.0 8.9 8.8*  PROT 6.6  --   --   --   ALBUMIN 3.3*  --   --   --   AST 27  --   --   --   ALT 12  --   --   --   ALKPHOS 56  --   --   --   BILITOT 0.7  --   --   --   GFRNONAA 37* 40* 44* 38*  GFRAA 42* 46* 51* 44*  ANIONGAP 11 5 7  4*     Hematology Recent Labs  Lab 03/05/18 0301 03/06/18 0232 03/07/18 0327  WBC 9.6 9.4 9.9  RBC 4.75 4.60 4.41  HGB 12.0 11.6* 11.3*  HCT 39.5 39.2 38.1  MCV 83.2 85.2 86.4  MCH 25.3* 25.2* 25.6*  MCHC 30.4  29.6* 29.7*  RDW 17.4* 17.9* 17.6*  PLT 315 323 277    Cardiac Enzymes Recent Labs  Lab 03/03/18 1614 03/04/18 0233 03/04/18 0800 03/04/18 1434  TROPONINI 0.03* 0.03* 0.03* 0.03*    Recent Labs  Lab 03/03/18 1755  TROPIPOC 0.02     BNP Recent Labs  Lab 03/03/18 1614  BNP 110.0*     DDimer No results for input(s): DDIMER in the last 168 hours.   Radiology    Dg Shoulder Right  Result Date: 03/06/2018 CLINICAL DATA:  Pt complains of right shoulder and right hip pain since falling out of her hospital chair this AM EXAM: RIGHT SHOULDER - 2+ VIEW COMPARISON:  Chest x-ray dated 03/03/2018. Plain film of the RIGHT shoulder dated 03/18/2007. FINDINGS: Old healed fracture of the RIGHT humeral neck. No acute fracture or dislocation. Stable diastasis at the acromioclavicular joint space. Soft tissues about the RIGHT shoulder are unremarkable. IMPRESSION: No acute findings.  Old healed fracture of the RIGHT humeral neck. Electronically Signed   By: Franki Cabot M.D.   On: 03/06/2018 15:00   Dg Hip Unilat With Pelvis 1v Right  Result Date: 03/06/2018 CLINICAL DATA:  Pt complains of right shoulder and right hip pain since falling out of her hospital chair  this AM EXAM: DG HIP (WITH OR WITHOUT PELVIS) 1V RIGHT COMPARISON:  None. FINDINGS: Single view of the pelvis and single view of the RIGHT hip are provided. Osseous alignment appears normal. No fracture line or displaced fracture fragment identified. Presumed hernia repair hardware overlying the LEFT groin region. Soft tissues about the pelvis and RIGHT hip are otherwise unremarkable. IMPRESSION: No acute findings.  No osseous fracture or dislocation seen. Electronically Signed   By: Franki Cabot M.D.   On: 03/06/2018 14:59    Cardiac Studies   Echo 02/20/2018 LV EF: 55% -   60% Study Conclusions  - Left ventricle: The cavity size was normal. Wall thickness was   increased in a pattern of severe LVH. Systolic function was   normal. The estimated ejection fraction was in the range of 55%   to 60%. Wall motion was normal; there were no regional wall   motion abnormalities. - Aortic valve: Valve mobility was severely restricted.   Transvalvular velocity was increased. There was severe stenosis.   There was mild regurgitation. Valve area (VTI): 1.06 cm^2. Valve   area (Vmax): 1.02 cm^2. Valve area (Vmean): 1.1 cm^2. - Aortic root: The aortic root was mildly dilated. - Mitral valve: Mildly calcified annulus. There was mild   regurgitation. - Left atrium: The atrium was moderately dilated. - Pulmonary arteries: Systolic pressure was mildly to moderately   increased. PA peak pressure: 41 mm Hg (S).    Cath 03/05/2018 Conclusion     Hemodynamic findings consistent with moderate pulmonary hypertension.  LV end diastolic pressure is severely elevated.  Heavily calcified coronary arteries with essentially the entire RCA circumferential calcification. The proximal portions of the LAD and circumflex also have almost circumferential calcification.  Dist - apical LAD lesion is 70% stenosed.  Small Caliber 1st Diag: Ost 1st Diag to 1st Diag lesion is 65% stenosed.  Small Caliber 2nd Diag:  Ost 2nd Diag lesion is 40% stenosed. 2nd Diag lesion is 75% stenosed.  Prox Cx lesion is 50% stenosed.  Ost RCA to Prox RCA lesion is 45% stenosed. Prox RCA lesion is 50% stenosed.  Dist RCA-1 lesion is 75% stenosed. Dist RCA-2 lesion is 40% stenosed with 95% stenosed side branch  in [place]. RPDA lesion is 85% stenosed.    Hemodynamically moderate to severe aortic stenosis with heavily calcified aortic valve.  Moderate combined pulmonary hypertension with elevated LVEDP  Heavily calcified native coronary arteries with minimal disease in the LAD and circumflex system.  Severe disease in the distal RCA-RPDA (best treated medically due to extensive calcification and distal nature of disease)   Plan: return to nursing unit after sheath removal in PACU Holding area She will be seen by Dr. Burt Knack from TAVR team to discuss options. RCA lesions are potential culprits for angina, but would be challenging PCI targets & would require femoral access (also potential atherectomy).      Patient Profile     84 y.o. female with a hx of CAD s/p remote stenting to RCA (2001), permanent atrial fib (not on [place]), HTN, DMT2, HLD, CKD, aortic dilation,dementiaand severe aortic stenosiswho is being considered for TAVR versus palliative management of aortic stenosis and other significant comorbidities including dementia and decreased exertional ability. Underwent cath 03/05/2018, moderate severe RCA disease involving RPDA, medical management recommended due to extensive calcification  Assessment & Plan    1. Severe aortic stenosis: evaluated by Dr. Burt Knack for TAVR, family and patient expressed wish to persue conservative approach instead. No further workup at this time  2. Moderately severe RCA CAD: best managed by medical therapy  3. Chronic atrial fibrillation: on ASA, no AV nodal blocking agent.   4. CKD: stable.   5. Deconditioning: no ambulation since hospitalization, seen by PT today, recommend  SNF. D/C once set up with SNF  For questions or updates, please contact [place] Please consult [email or web address] for contact info under Cardiology/STEMI.     Hilbert Corrigan, PA  03/08/2018, 12:59 PM    The patient was seen, examined and discussed with Almyra Deforest, PA-C and I agree with the above.    The patient is well compensated, she is awaiting bed at the SNF, discharge if the bed available today.  Ena Dawley, MD 03/08/2018

## 2018-03-08 NOTE — Care Management Important Message (Signed)
Important Message  Patient Details  Name: Cassidy [name] MRN: [id number] Date of Birth: 20-Nov-1926   Medicare Important Message Given:  Yes    [name] P [place] 03/08/2018, 2:10 PM

## 2018-03-08 NOTE — Progress Notes (Signed)
3 person assist pt up to [facility]. Pt with weakness and unable to stand and turn without assistance.  Bathed and assisted pt back to bed.  Unable to obtain a standing weight at this time due to pt being unable to stand by self.

## 2018-03-08 NOTE — Care Management (Signed)
16:00 CM spoke with daughter - daughter declined to speak with CM regarding discharge planning including home/HH and DME - daughter to contact attending group once she arrives at hospital this evening - attending group aware  3:45pm CM informed by CSW that daughter does not wish for pt to discharge to SNF - daughter would like for pt to discharge home.  CM contacted attending PA and informed of discharge plan change - NP will discuss with attending and determine if pt will be home with hospice appropriate  10:00 am: CM spoke with attending PA and was informed that pt was medically stable for discharge to SNF today.

## 2018-03-08 NOTE — Evaluation (Signed)
Physical Therapy Evaluation Patient Details Name: Cassidy [name] MRN: [id number] DOB: 1926/12/26 Today's Date: 03/08/2018   History of Present Illness  84 yo admitted with [place] and AS not a candidate for TAVR. PMHx: Afib, HTN, DM, CAD  Clinical Impression  Pt lethargic with decreased attention, memory, problem solving and functioning. Unable to ascertain PLOF or home setup due to cognition but pt lives with family per nursing report with assist for care but pt is able to move around in home. Currently pt requires 2 person assist to safely stand and could not process instructions for pivot to chair. Pt with decreased cognition, balance, safety, strength and mobility who will benefit from acute therapy to maximize mobility, function and safety to decrease burden of care.  HR 84-88     Follow Up Recommendations SNF;Supervision/Assistance - 24 hour    Equipment Recommendations  Other (comment)(TBD)    Recommendations for Other Services       Precautions / Restrictions Precautions Precautions: Fall      Mobility  Bed Mobility Overal bed mobility: Needs Assistance Bed Mobility: Supine to Sit;Sit to Supine     Supine to sit: Mod assist;HOB elevated     General bed mobility comments: mod assist to elevate trunk and pivot hips to EOB with HOB 35 degrees with sequential cues and increased time. To return to bed assist for bil LE onto surface. Max +2 to scoot to [facility]  Transfers Overall transfer level: Needs assistance   Transfers: Sit to/from Stand Sit to Stand: Mod assist;+2 physical assistance         General transfer comment: pt stood x 2 trials from EOB with cues for sequence and assist to rise from surface. Attempted to step toward [facility] but pt not following commands and stepping in opposite direction then trying to sit prematurely with max assist to achieve sitting on surface. Attempted side stepping on 2nd stand but pt did not move either foot and returned to  bed  Ambulation/Gait             General Gait Details: unable to safely attempt  Stairs            Wheelchair Mobility    Modified Rankin (Stroke Patients Only)       Balance Overall balance assessment: Needs assistance   Sitting balance-Leahy Scale: Poor Sitting balance - Comments: pt with right anterior lean in sitting with min -mod assist to correct and maintain balance Postural control: Right lateral lean   Standing balance-Leahy Scale: Poor Standing balance comment: 2 person mod assist for balance with RW use                             Pertinent Vitals/Pain Pain Assessment: No/denies pain    Home Living Family/patient expects to be discharged to:: Private residence Living Arrangements: Spouse/significant other;Children Available Help at Discharge: Family Type of Home: House Home Access: Stairs to enter   Technical brewer of Steps: 2 Home Layout: Two level;Able to live on main level with bedroom/bathroom Home Equipment: Gilford Rile - 2 wheels;Bedside commode Additional Comments: home setup taken from prior admission as pt unable to provide    Prior Function Level of Independence: Needs assistance         Comments: pt reports she does not walk, does not get to a WC and is able to care for herself- no family present     Hand Dominance  Extremity/Trunk Assessment   Upper Extremity Assessment Upper Extremity Assessment: Generalized weakness;Difficult to assess due to impaired cognition    Lower Extremity Assessment Lower Extremity Assessment: Generalized weakness;Difficult to assess due to impaired cognition    Cervical / Trunk Assessment Cervical / Trunk Assessment: Kyphotic  Communication   Communication: No difficulties  Cognition Arousal/Alertness: Lethargic Behavior During Therapy: Flat affect Overall Cognitive Status: Impaired/Different from baseline Area of Impairment: Orientation;Attention;Memory;Following  commands;Safety/judgement;Awareness;Problem solving                 Orientation Level: Disoriented to;Situation;Time Current Attention Level: Focused Memory: Decreased short-term memory Following Commands: Follows one step commands inconsistently Safety/Judgement: Decreased awareness of safety;Decreased awareness of deficits   Problem Solving: Slow processing;Decreased initiation;Requires tactile cues;Requires verbal cues;Difficulty sequencing General Comments: pt with difficulty maintaining eyes open with cues for arousal and alertness, unable to follow commands in standing to safely step or transfer to chair      General Comments      Exercises     Assessment/Plan    PT Assessment Patient needs continued PT services  PT Problem List Decreased strength;Decreased mobility;Decreased safety awareness;Decreased activity tolerance;Decreased balance;Decreased knowledge of use of DME;Decreased cognition;Decreased coordination       PT Treatment Interventions DME instruction;Therapeutic activities;Cognitive remediation;Gait training;Therapeutic exercise;Patient/family education;Balance training;Functional mobility training;Neuromuscular re-education    PT Goals (Current goals can be found in the Care Plan section)  Acute Rehab PT Goals PT Goal Formulation: Patient unable to participate in goal setting Time For Goal Achievement: 03/22/18 Potential to Achieve Goals: Fair    Frequency Min 2X/week   Barriers to discharge Decreased caregiver support      Co-evaluation               AM-PAC PT "6 Clicks" Daily Activity  Outcome Measure Difficulty turning over in bed (including adjusting bedclothes, sheets and blankets)?: Unable Difficulty moving from lying on back to sitting on the side of the bed? : Unable Difficulty sitting down on and standing up from a chair with arms (e.g., wheelchair, bedside commode, etc,.)?: Unable Help needed moving to and from a bed to chair  (including a wheelchair)?: Total Help needed walking in hospital room?: Total Help needed climbing 3-5 steps with a railing? : Total 6 Click Score: 6    End of Session Equipment Utilized During Treatment: Gait belt Activity Tolerance: Patient tolerated treatment well Patient left: in bed;with call bell/phone within reach;with bed alarm set Nurse Communication: Mobility status;Need for lift equipment PT Visit Diagnosis: Other abnormalities of gait and mobility (R26.89);Muscle weakness (generalized) (M62.81);Unsteadiness on feet (R26.81)    Time: 1145-1200 PT Time Calculation (min) (ACUTE ONLY): 15 min   Charges:   PT Evaluation $PT Eval Moderate Complexity: 1 Mod     PT G Codes:        Elwyn Reach, PT [phone]   [place] B Cassidy [name] 03/08/2018, 12:38 PM

## 2018-03-09 DIAGNOSIS — Z515 Encounter for palliative care: Secondary | ICD-10-CM

## 2018-03-09 DIAGNOSIS — Z7189 Other specified counseling: Secondary | ICD-10-CM

## 2018-03-09 LAB — GLUCOSE, CAPILLARY
GLUCOSE-CAPILLARY: 189 mg/dL — AB (ref 70–99)
Glucose-Capillary: 186 mg/dL — ABNORMAL HIGH (ref 70–99)
Glucose-Capillary: 156 mg/dL — ABNORMAL HIGH (ref 70–99)
Glucose-Capillary: 166 mg/dL — ABNORMAL HIGH (ref 70–99)

## 2018-03-09 MED ORDER — OXYCODONE HCL 5 MG/5ML PO SOLN
5.0000 mg | ORAL | Status: DC | PRN
  Administered 2018-03-09 – 2018-03-10 (×6): 5 mg via ORAL
  Filled 2018-03-09 (×7): qty 5

## 2018-03-09 MED ORDER — ALPRAZOLAM 0.25 MG PO TABS
0.2500 mg | ORAL_TABLET | Freq: Three times a day (TID) | ORAL | Status: DC | PRN
  Administered 2018-03-09 – 2018-03-10 (×3): 0.25 mg via ORAL
  Filled 2018-03-09 (×3): qty 1

## 2018-03-09 NOTE — Evaluation (Addendum)
Occupational Therapy Evaluation Patient Details Name: Cassidy [name] MRN: [id number] DOB: 1927-04-16 Today's Date: 03/09/2018    History of Present Illness 84 yo admitted with [place] and AS not a candidate for TAVR. PMHx: Afib, HTN, DM, CAD   Clinical Impression   Per chart review, pt was living with her husband at home PTA. No family present to confirm information and pt poor historian presenting with confusion. Pt currently requiring Max A for ADLs and Max A +2 for functional transfer with Max cues throughout for sequencing. Pt presenting with decreased strength, balance, and cognition. Pt would benefit from further acute OT to facilitate safe dc. Recommend dc to SNF for further OT to optimize safety, independence with ADLs, and return to PLOF.      Follow Up Recommendations  SNF;Supervision/Assistance - 24 hour    Equipment Recommendations  Other (comment)(Pending confirmation of DME and defer to next venue.)    Recommendations for Other Services PT consult;Speech consult     Precautions / Restrictions Precautions Precautions: Fall Restrictions Weight Bearing Restrictions: No      Mobility Bed Mobility Overal bed mobility: Needs Assistance Bed Mobility: Supine to Sit     Supine to sit: Mod assist;+2 for physical assistance;HOB elevated     General bed mobility comments: Mod A +2 to bring BLEs towards EOB and then transition hips towards EOB with bed pad. Pt requiring assistance to elevate trunk. Once at EOB, pt able to maintain sitting posture without physical A  Transfers Overall transfer level: Needs assistance Equipment used: 2 person hand held assist Transfers: Sit to/from [facility] Sit to Stand: Mod assist;+2 physical assistance;From elevated surface Stand pivot transfers: +2 physical assistance;Max assist       General transfer comment: Mod A +2 to initate standing from EOB. Pt then requiring Max A +2 to maintain balance and pivot to recliner.  Pt with poor cognition and requiring Max cues    Balance Overall balance assessment: Needs assistance Sitting-balance support: No upper extremity supported;Feet supported Sitting balance-Leahy Scale: Fair Sitting balance - Comments: No physical A needed for static sitting, but close MIn Guard due to cognitive deficits and poor safety awareness   Standing balance support: Bilateral upper extremity supported;During functional activity Standing balance-Leahy Scale: Poor Standing balance comment: 2 person assist for balance                           ADL either performed or assessed with clinical judgement   ADL Overall ADL's : Needs assistance/impaired Eating/Feeding: Maximal assistance;Cueing for sequencing;Sitting   Grooming: Maximal assistance;Sitting   Upper Body Bathing: Maximal assistance;Sitting;Bed level   Lower Body Bathing: Maximal assistance;Sit to/from stand;+2 for physical assistance;Bed level Lower Body Bathing Details (indicate cue type and reason): Pt requiring Max A +2 for sit<>stand.  Upper Body Dressing : Maximal assistance;Sitting;Bed level   Lower Body Dressing: Maximal assistance;+2 for physical assistance;Sit to/from stand;Bed level Lower Body Dressing Details (indicate cue type and reason): Donning socks at bed level with Max A. Pt lifting feet to assist in donning socks. Pt unable to maintain standing without Max A +2 Toilet Transfer: Maximal assistance;+2 for physical assistance;Stand-pivot(simulated to recliner) Toilet Transfer Details (indicate cue type and reason): Pt requiring Max A +2 to assist in standing balance and facilitate transition towards recliner. Pt with decreased congition and requiring Max cues for sequencing and stating "I can't" when asked to move LEs         Functional  mobility during ADLs: Maximal assistance;+2 for physical assistance General ADL Comments: Pt with decreased strength, balance, and cognition. Pt requiring Pt  stating "I can't throughout session."     Vision Baseline Vision/History: Wears glasses Additional Comments: Difficult to assess due to cognition. When reaching during self feeding, pt overshooting and required visual cues for corrections     Perception     Praxis      Pertinent Vitals/Pain Pain Assessment: No/denies pain     Hand Dominance Right   Extremity/Trunk Assessment Upper Extremity Assessment Upper Extremity Assessment: Generalized weakness;RUE deficits/detail RUE Deficits / Details: Pt cautious to perform shoulder ROM as she fell on right shoulder. During ADLs, pt avoiding RUE to reach forward or reaching over head.  RUE Coordination: decreased gross motor   Lower Extremity Assessment Lower Extremity Assessment: Generalized weakness   Cervical / Trunk Assessment Cervical / Trunk Assessment: Kyphotic   Communication Communication Communication: Other (comment)(Noting Echolalia with pt repeating that therapist says)   Cognition Arousal/Alertness: Awake/alert Behavior During Therapy: Flat affect Overall Cognitive Status: Impaired/Different from baseline Area of Impairment: Attention;Following commands;Memory;Safety/judgement;Awareness;Problem solving;Orientation                 Orientation Level: Disoriented to;Situation;Time Current Attention Level: Focused Memory: Decreased short-term memory Following Commands: Follows one step commands inconsistently Safety/Judgement: Decreased awareness of safety;Decreased awareness of deficits Awareness: Intellectual Problem Solving: Slow processing;Decreased initiation;Difficulty sequencing;Requires verbal cues;Requires tactile cues General Comments: Pt requiring Max cues throughout session to sequencing tasks and intiate. Pt requring Max visual and verabl cues for performing self feeding task. Pt not initating task without cues and then requires simple, direct cues which she follows inconsistantly. When cueing pt to  "pick up spoon", pt attempts to pick up spoon with her mouth.    General Comments  VSS throughout. RN present    Exercises     Shoulder Instructions      Home Living Family/patient expects to be discharged to:: Private residence Living Arrangements: Spouse/significant other;Children Available Help at Discharge: Family Type of Home: House Home Access: Stairs to enter Technical brewer of Steps: 2   Home Layout: Two level;Able to live on main level with bedroom/bathroom               Home Equipment: Gilford Rile - 2 wheels;Bedside commode   Additional Comments: Home setup gathered from chart review and prior admission. No family present to confirm information.  Pt stating that he lives with her husband and has a RW and BSC.       Prior Functioning/Environment Level of Independence: Needs assistance  Gait / Transfers Assistance Needed: Pt reporting she uses RW ADL's / Homemaking Assistance Needed: Pt reporting that family does IADLs and she can do BADLs. Unsure of accuracy due to confusion   Comments: No family present to confirm information        OT Problem List: Decreased strength;Decreased range of motion;Decreased activity tolerance;Impaired balance (sitting and/or standing);Decreased cognition;Decreased safety awareness;Decreased knowledge of use of DME or AE;Impaired UE functional use      OT Treatment/Interventions: Self-care/ADL training;Therapeutic exercise;Energy conservation;DME and/or AE instruction;Therapeutic activities;Patient/family education    OT Goals(Current goals can be found in the care plan section) Acute Rehab OT Goals Patient Stated Goal: Unstated OT Goal Formulation: Patient unable to participate in goal setting Time For Goal Achievement: 03/23/18 Potential to Achieve Goals: Good ADL Goals Pt Will Perform Eating: with min assist;sitting Pt Will Perform Grooming: with min assist;sitting Pt Will Perform Upper Body Dressing: with min  assist;sitting Pt Will Transfer to Toilet: with min assist;with +2 assist;bedside commode;stand pivot transfer Additional ADL Goal #1: Pt will perform bed mobility with Min Guard A in preparation for ADLs Additional ADL Goal #2: Pt will perform ADLs with Min cues for initation and sequencing  OT Frequency: Min 2X/week   Barriers to D/C:            Co-evaluation              AM-PAC PT "6 Clicks" Daily Activity     Outcome Measure Help from another person eating meals?: A Lot Help from another person taking care of personal grooming?: A Lot Help from another person toileting, which includes using toliet, bedpan, or urinal?: A Lot Help from another person bathing (including washing, rinsing, drying)?: A Lot Help from another person to put on and taking off regular upper body clothing?: A Lot Help from another person to put on and taking off regular lower body clothing?: A Lot 6 Click Score: 12   End of Session Equipment Utilized During Treatment: Gait belt;Oxygen Nurse Communication: Mobility status;Other (comment)(Requiring Max A for self feeding)  Activity Tolerance: Other (comment)(Limited by cognition) Patient left: in chair;with call bell/phone within reach;with chair alarm set;with nursing/sitter in room  OT Visit Diagnosis: Unsteadiness on feet (R26.81);Other abnormalities of gait and mobility (R26.89);Muscle weakness (generalized) (M62.81);Other symptoms and signs involving cognitive function;History of falling (Z91.81)                Time: 5110-2111 OT Time Calculation (min): 25 min Charges:  OT General Charges $OT Visit: 1 Visit OT Evaluation $OT Eval Moderate Complexity: 1 Mod OT Treatments $Self Care/Home Management : 8-22 mins G-Codes:     Camelle Henkels MSOT, OTR/L Acute Rehab Pager: [phone] Office: [place] 03/09/2018, 9:52 AM

## 2018-03-09 NOTE — Progress Notes (Addendum)
Consultation Note Date: 03/09/2018   Patient Name: Cassidy [name]  DOB: 23-Feb-1927  MRN: [id number]  Age / Sex: 84 y.o., female  PCP: [name], MD Referring Physician: Buford Dresser, *  Reason for Consultation: Establishing goals of care  HPI/Patient Profile: 84 y.o. female  with past medical history of CAD, HTN, DMT2, HLD, GERD, CKD, dementia admitted on 03/03/2018 with recurrent episode of chest pain. Workup reveals severe aortic stenosis. She had a cath additionally showing severe RCA disease. She has declined TAVR. Palliative medicine consulted for [place].    Clinical Assessment and Goals of Care:  Evaluated patient at bedside. She was oriented to person but not place or situation. She was unable to participate in [place] conversation.   I have reviewed medical records including EPIC notes, labs and imaging, received report from patient's nurses, assessed the patient and then met at the bedside along with patient's two daughters  to discuss diagnosis prognosis, GOC, EOL wishes, disposition and options.  I introduced Palliative Medicine as specialized medical care for people living with serious illness. It focuses on providing relief from the symptoms and stress of a serious illness. The goal is to improve quality of life for both the patient and the family.  We discussed a brief life review of the patient. She is retired from working as a Network engineer for [employer]. Married to her husband for 70 years. He also has dementia. They both live with their daughter [name].   As far as functional and nutritional status - prior to admission she was ambulating with a walker. She would get out of bed for meals, then return to bed and go to sleep, except for Wednesdays when she would go with her daughter to get her hair done. Her daughter noted it was getting more and more difficult for her to ambulate. She  would get significantly SOB with any exertion. This admission she has not ambulated. She is eating only bites and sips.    We discussed their current illness and what it means in the larger context of their on-going co-morbidities.  Natural disease trajectory and expectations at EOL were discussed. Myna Hidalgo and Sula Soda both state they do not want their Mom to suffer. Their Mom has expressed a desire to die at home, however, due to their father's dementia, they do not feel amply prepared to provide the amount of care necessary this would require.     The difference between aggressive medical intervention and comfort care was considered in light of the patient's goals of care. The patient expressed previously to her Yolanda Bonine that she has lived a long life and she does not desire further aggressive medical interventions.   Advanced directives, concepts specific to code status, artifical feeding and hydration, and rehospitalization were considered and discussed. Jeraldine Loots and Sula Soda agree that DNR status best align with patient's GOC for natural dying process without aggressive medical interventions.   Hospice and Palliative Care services outpatient were explained and offered. Family is interested in considering residential Hospice  placement.   Questions and concerns were addressed.  Hard Choices booklet left for review. The family was encouraged to call with questions or concerns.   Primary Decision Maker NEXT OF KIN- patient's daughters- Noreene Larsson and Sula Soda    SUMMARY OF RECOMMENDATIONS -DNR -Family is considering transition to comfort measures only and residential Hospice placement vs continued aggressive medical care and SNF placement- they will contact PMT or primary team tomorrow and let us know their decision -Oxycodone solution 53m IR q2hr prn for pain -Xanax .254mTID prn for anxiety   Code Status/Advance Care Planning:  DNR  Palliative Prophylaxis:   Frequent Pain  Assessment  Additional Recommendations (Limitations, Scope, Preferences):  Minimize Medications and No Surgical Procedures  Prognosis:    Unable to determine  Discharge Planning: To Be Determined  Primary Diagnoses: Present on Admission: . Permanent atrial fibrillation ([facility]. Essential hypertension . Coronary artery disease involving native coronary artery of native heart with angina pectoris (HCC[place]. Falling episodes   I have reviewed the medical record, interviewed the patient and family, and examined the patient. The following aspects are pertinent.  Past Medical History:  Diagnosis Date  . Ascending aorta dilatation (HCC)    mild, echo, February, 2009  . Asthma   . Blindness of left eye    related to some type of stroke in the past  . CAD (coronary artery disease)    a. stents right coronary artery 2001. //  [place]:  LAD dist apical 70, D1 65, D2 40; pLCx 5068RCA ost 454prox RCA 50, dist RCA 75, 40; RPDA side br ost 95, RPDA 85 - med Rx  . CKD (chronic kidney disease), stage III (HCR[place]  . Colon polyp   . Dementia   . Diverticulosis   . DM (diabetes mellitus) ([facility]  . Dyslipidemia   . Falling episodes   . HTN (hypertension)   . Kidney stones   . Obesity   . Persistent atrial fibrillation (HCC)    a. pt declined anticoag. also hx of falls.  . PUD (peptic ulcer disease)   . Severe aortic stenosis    Echo 6/19: mean gradient 31, peak 69  . Ventral hernia    Social History   Socioeconomic History  . Marital status: Married    Spouse name: Not on file  . Number of children: 2  . Years of education: Not on file  . Highest education level: Not on file  Occupational History  . Occupation: retired    EmFish farm managerRETIRED  Social Needs  . Financial resource strain: Not on file  . Food insecurity:    Worry: Not on file    Inability: Not on file  . Transportation needs:    Medical: Not on file    Non-medical: Not on file  Tobacco Use  . Smoking status:  Never Smoker  . Smokeless tobacco: Never Used  Substance and Sexual Activity  . Alcohol use: No  . Drug use: No  . Sexual activity: Yes  Lifestyle  . Physical activity:    Days per week: Not on file    Minutes per session: Not on file  . Stress: Not on file  Relationships  . Social connections:    Talks on phone: Not on file    Gets together: Not on file    Attends religious service: Not on file    Active member of club or organization: Not on file    Attends meetings of clubs  or organizations: Not on file    Relationship status: Not on file  Other Topics Concern  . Not on file  Social History Narrative  . Not on file   Family History  Problem Relation Age of Onset  . Colon cancer Mother   . Heart attack Father        81  . Diabetes Sister   . Diabetes Daughter        x 2  . Hypertension Neg Hx   . Stroke Neg Hx    Scheduled Meds: . ALPRAZolam  1 mg Oral QHS  . aspirin EC  81 mg Oral Daily  . atorvastatin  10 mg Oral Q M,W,F  . heparin  5,000 Units Subcutaneous Q8H  . insulin aspart  0-15 Units Subcutaneous TID WC  . loratadine  10 mg Oral Daily  . losartan  100 mg Oral Daily  . oxybutynin  10 mg Oral Q breakfast  . psyllium  1 packet Oral Q breakfast  . sodium chloride flush  3 mL Intravenous Q12H  . sodium chloride flush  3 mL Intravenous Q12H   Continuous Infusions: . sodium chloride    . sodium chloride     PRN Meds:.sodium chloride, sodium chloride, acetaminophen, albuterol, ALPRAZolam, amitriptyline, nystatin, ondansetron (ZOFRAN) IV, oxyCODONE, sodium chloride flush, sodium chloride flush, sorbitol Medications Prior to Admission:  Prior to Admission medications   Medication Sig Start Date End Date Taking? Authorizing Provider  acetaminophen (TYLENOL) 500 MG tablet Take 1,000 mg by mouth every 8 (eight) hours as needed (for pain).   Yes [provider]  albuterol (PROVENTIL) (2.5 MG/3ML) 0.083% nebulizer solution Take 3 mLs by nebulization every  4 (four) hours as needed for wheezing. 08/04/17  Yes [provider]  ALPRAZolam Duanne Moron) 1 MG tablet Take 1 mg by mouth at bedtime.    Yes [provider]  amitriptyline (ELAVIL) 10 MG tablet Take 1 tablet (10 mg total) by mouth at bedtime as needed for sleep. 02/21/18  Yes Emokpae, Courage, MD  aspirin EC 325 MG tablet Take 1 tablet (325 mg total) by mouth daily. With Food 02/21/18  Yes Roxan Hockey, MD  atorvastatin (LIPITOR) 10 MG tablet Take 1 tablet (10 mg total) by mouth every Monday, Wednesday, and Friday. 03/01/18  Yes Jettie Booze, MD  Calcium Carb-Cholecalciferol (CALTRATE 600+D3 PO) Take 1 tablet by mouth daily with breakfast.   Yes [provider]  Cranberry-Vitamin C (AZO CRANBERRY URINARY TRACT) 250-60 MG CAPS Take 2 capsules by mouth daily with breakfast.   Yes [provider]  donepezil (ARICEPT) 10 MG tablet Take 10 mg by mouth at bedtime.    Yes [provider]  fluconazole (DIFLUCAN) 150 MG tablet Take 150 mg by mouth once as needed (for intermittent yeast infections).   Yes [provider]  insulin NPH-regular Human (NOVOLIN 70/30 RELION) (70-30) 100 UNIT/ML injection Inject 15-28 Units into the skin See admin instructions. Inject 28 units into the skin after breakfast and 15 units at bedtime   Yes [provider]  Lactobacillus Rhamnosus, GG, (CULTURELLE) CAPS Take 1 capsule by mouth daily.   Yes [provider]  loratadine (CLARITIN) 10 MG tablet Take 10 mg by mouth daily.   Yes [provider]  losartan-hydrochlorothiazide (HYZAAR) 100-12.5 MG tablet Take 1 tablet by mouth daily with breakfast. 06/25/16  Yes [provider]  metFORMIN (GLUCOPHAGE) 500 MG tablet Take 500 mg by mouth 2 (two) times daily with a meal.  Yes [provider]  nystatin cream (MYCOSTATIN) Apply 1 application topically 3 (three) times daily as needed (for vaginal yeast infections).  06/14/16  Yes  [provider]  oxybutynin (DITROPAN-XL) 10 MG 24 hr tablet Take 10 mg by mouth daily with breakfast.  12/29/15  Yes [provider]  Psyllium (METAMUCIL FIBER PO) Take 10 g by mouth daily with breakfast.   Yes [provider]  sorbitol 70 % SOLN Take 20 mLs by mouth daily as needed for moderate constipation. 07/25/16  Yes Nita Sells, MD   Allergies  Allergen Reactions  . Latex Rash   Review of Systems  Unable to perform ROS: Dementia    Physical Exam  Pulmonary/Chest: Effort normal.  Breath sounds diminished  Abdominal: Soft.  Neurological: She is alert.  Oriented to place only  Skin:  Bruising around R hip  Nursing note and vitals reviewed.   Vital Signs: BP (!) 121/49 (BP Location: Right Wrist)   Pulse 65   Temp 97.6 F (36.4 C) (Oral)   Resp 19   Ht _0  (1.499 m)   Wt 86.9 kg (191 lb 9.3 oz)   SpO2 98%   BMI 38.69 kg/m  Pain Scale: 0-10 POSS *See Group Information*: 1-Acceptable,Awake and alert Pain Score: Asleep   SpO2: SpO2: 98 % O2 Device:SpO2: 98 % O2 Flow Rate: .O2 Flow Rate (L/min): 2 L/min  IO: Intake/output summary:   Intake/Output Summary (Last 24 hours) at 03/09/2018 1656 Last data filed at 03/09/2018 0921 Gross per 24 hour  Intake 240 ml  Output -  Net 240 ml    LBM: Last BM Date: 03/06/18 Baseline Weight: Weight: 85.2 kg (187 lb 13.3 oz) Most recent weight: Weight: 86.9 kg (191 lb 9.3 oz)     Palliative Assessment/Data: PPS: 20%     Thank you for this consult. Palliative medicine will continue to follow and assist as needed.   Time In: 1430 Time Out: 1700 Time Total: 150 minutes Prolonged services billed: Yes Greater than 50%  of this time was spent counseling and coordinating care related to the above assessment and plan.  Signed by: Mariana Kaufman, AGNP-C Palliative Medicine    Please contact Palliative Medicine Team phone at [phone] for questions and concerns.  For individual provider: See  Shea Evans

## 2018-03-09 NOTE — Progress Notes (Signed)
Progress Note  Patient Name: Cassidy [name] Date of Encounter: 03/09/2018  Primary Cardiologist: Larae Grooms, MD   Subjective   Sleepy, no chest pain or SOB.   Inpatient Medications    Scheduled Meds: . ALPRAZolam  1 mg Oral QHS  . aspirin EC  81 mg Oral Daily  . atorvastatin  10 mg Oral Q M,W,F  . heparin  5,000 Units Subcutaneous Q8H  . insulin aspart  0-15 Units Subcutaneous TID WC  . loratadine  10 mg Oral Daily  . losartan  100 mg Oral Daily  . oxybutynin  10 mg Oral Q breakfast  . psyllium  1 packet Oral Q breakfast  . sodium chloride flush  3 mL Intravenous Q12H  . sodium chloride flush  3 mL Intravenous Q12H   Continuous Infusions: . sodium chloride    . sodium chloride     PRN Meds: sodium chloride, sodium chloride, acetaminophen, albuterol, amitriptyline, nystatin, ondansetron (ZOFRAN) IV, sodium chloride flush, sodium chloride flush, sorbitol   Vital Signs    Vitals:   03/09/18 0320 03/09/18 0522 03/09/18 0525 03/09/18 0710  BP: (!) 99/40  (!) 111/47 (!) 109/49  Pulse: 68   65  Resp: 19  19 17   Temp: 98.6 F (37 C)   98.4 F (36.9 C)  TempSrc: Oral   Oral  SpO2: 98%     Weight:  191 lb 9.3 oz (86.9 kg)    Height:        Intake/Output Summary (Last 24 hours) at 03/09/2018 1150 Last data filed at 03/09/2018 0921 Gross per 24 hour  Intake 480 ml  Output -  Net 480 ml   Filed Weights   03/07/18 0327 03/08/18 0130 03/09/18 0522  Weight: 194 lb 10.7 oz (88.3 kg) 194 lb 7.1 oz (88.2 kg) 191 lb 9.3 oz (86.9 kg)    Telemetry    Atrial fibrillation, self rate controlled - Personally Reviewed  ECG    No new EKG   Physical Exam   GEN: sleepy  Neck: No JVD Cardiac: irregularly irregular, no rubs, or gallops. 2/6 systolic murmur at RUSB Respiratory: Clear to auscultation bilaterally. GI: Soft, nontender, non-distended  MS: No edema; No deformity. Neuro:  Nonfocal  Psych: Normal affect   Labs    Chemistry Recent Labs  Lab  03/03/18 1614 03/05/18 0301 03/06/18 0232 03/07/18 0327  NA 140 140 140 137  K 4.3 4.2 4.3 4.6  CL 98 102 103 102  CO2 31 33* 30 31  GLUCOSE 124* 198* 188* 203*  BUN 20 22 18 23   CREATININE 1.26* 1.18* 1.09* 1.23*  CALCIUM 10.0 9.0 8.9 8.8*  PROT 6.6  --   --   --   ALBUMIN 3.3*  --   --   --   AST 27  --   --   --   ALT 12  --   --   --   ALKPHOS 56  --   --   --   BILITOT 0.7  --   --   --   GFRNONAA 37* 40* 44* 38*  GFRAA 42* 46* 51* 44*  ANIONGAP 11 5 7  4*     Hematology Recent Labs  Lab 03/05/18 0301 03/06/18 0232 03/07/18 0327  WBC 9.6 9.4 9.9  RBC 4.75 4.60 4.41  HGB 12.0 11.6* 11.3*  HCT 39.5 39.2 38.1  MCV 83.2 85.2 86.4  MCH 25.3* 25.2* 25.6*  MCHC 30.4 29.6* 29.7*  RDW 17.4* 17.9* 17.6*  PLT 315 323 277    Cardiac Enzymes Recent Labs  Lab 03/03/18 1614 03/04/18 0233 03/04/18 0800 03/04/18 1434  TROPONINI 0.03* 0.03* 0.03* 0.03*    Recent Labs  Lab 03/03/18 1755  TROPIPOC 0.02     BNP Recent Labs  Lab 03/03/18 1614  BNP 110.0*     DDimer No results for input(s): DDIMER in the last 168 hours.   Radiology    No results found.  Cardiac Studies   Echo 02/20/2018 LV EF: 55% -   60% Study Conclusions  - Left ventricle: The cavity size was normal. Wall thickness was   increased in a pattern of severe LVH. Systolic function was   normal. The estimated ejection fraction was in the range of 55%   to 60%. Wall motion was normal; there were no regional wall   motion abnormalities. - Aortic valve: Valve mobility was severely restricted.   Transvalvular velocity was increased. There was severe stenosis.   There was mild regurgitation. Valve area (VTI): 1.06 cm^2. Valve   area (Vmax): 1.02 cm^2. Valve area (Vmean): 1.1 cm^2. - Aortic root: The aortic root was mildly dilated. - Mitral valve: Mildly calcified annulus. There was mild   regurgitation. - Left atrium: The atrium was moderately dilated. - Pulmonary arteries: Systolic  pressure was mildly to moderately   increased. PA peak pressure: 41 mm Hg (S).    Cath 03/05/2018 Conclusion     Hemodynamic findings consistent with moderate pulmonary hypertension.  LV end diastolic pressure is severely elevated.  Heavily calcified coronary arteries with essentially the entire RCA circumferential calcification. The proximal portions of the LAD and circumflex also have almost circumferential calcification.  Dist - apical LAD lesion is 70% stenosed.  Small Caliber 1st Diag: Ost 1st Diag to 1st Diag lesion is 65% stenosed.  Small Caliber 2nd Diag: Ost 2nd Diag lesion is 40% stenosed. 2nd Diag lesion is 75% stenosed.  Prox Cx lesion is 50% stenosed.  Ost RCA to Prox RCA lesion is 45% stenosed. Prox RCA lesion is 50% stenosed.  Dist RCA-1 lesion is 75% stenosed. Dist RCA-2 lesion is 40% stenosed with 95% stenosed side branch in Ost RPDA. RPDA lesion is 85% stenosed.    Hemodynamically moderate to severe aortic stenosis with heavily calcified aortic valve.  Moderate combined pulmonary hypertension with elevated LVEDP  Heavily calcified native coronary arteries with minimal disease in the LAD and circumflex system.  Severe disease in the distal RCA-RPDA (best treated medically due to extensive calcification and distal nature of disease)   Plan: return to nursing unit after sheath removal in PACU Holding area She will be seen by Dr. Burt Knack from TAVR team to discuss options. RCA lesions are potential culprits for angina, but would be challenging PCI targets & would require femoral access (also potential atherectomy).      Patient Profile     84 y.o. female with a hx of CAD s/p remote stenting to RCA (2001), permanent atrial fib (not on [place]), HTN, DMT2, HLD, CKD, aortic dilation,dementiaand severe aortic stenosiswho is being considered for TAVR versus palliative management of aortic stenosis and other significant comorbidities including dementia and  decreased exertional ability. Underwent cath 03/05/2018, moderate severe RCA disease involving RPDA, medical management recommended due to extensive calcification  Assessment & Plan    1. Severe aortic stenosis: evaluated by Dr. Burt Knack for TAVR, family and patient expressed wish to persue conservative approach instead. No further workup at this time 2. Moderately severe RCA CAD: best managed  by medical therapy 3. Chronic atrial fibrillation: on ASA, no AV nodal blocking agent.  4. CKD: stable.  5. Deconditioning: no ambulation since hospitalization, seen by PT today, recommend SNF. D/C once set up with SNF  The patient is well compensated, still in the hospital for social issues, the daughter doesn't feel like she can manage care at home but doesn't want a SNF either, we suggested home hospice/palliative care, meeting arranged for this afternoon.   Ena Dawley, MD 03/09/2018

## 2018-03-09 NOTE — Progress Notes (Signed)
No charge note:   Palliative consult received:   Spoke with patient's daughter- meeting arranged for 2:30 today.  Mariana Kaufman, AGNP-C Palliative Medicine  Please call Palliative Medicine team phone with any questions [phone]. For individual providers please see AMION.

## 2018-03-09 NOTE — Discharge Summary (Addendum)
Discharge Summary    Patient ID: Cassidy [name],  MRN: [id number], DOB/AGE: 82-May-1928 82 y.o.  Admit date: 03/03/2018 Discharge date: 03/10/2018  Primary Care Provider: [name] Primary Cardiologist: Larae Grooms, MD  Discharge Diagnoses    Principal Problem:   Severe aortic stenosis Active Problems:   Coronary artery disease involving native coronary artery of native heart with angina pectoris (HCC)   Permanent atrial fibrillation ([place])   Essential hypertension   Diabetes mellitus type 2 in obese (HCC)   Falling episodes   CKD (chronic kidney disease), stage III [facility])   Advance care planning   Goals of care, counseling/discussion   Palliative care by specialist  Allergies Allergies  Allergen Reactions  . Latex Rash   Diagnostic Studies/Procedures    Cardiac catheterization 03/05/2018:  Hemodynamic findings consistent with moderate pulmonary hypertension.  LV end diastolic pressure is severely elevated.  Heavily calcified coronary arteries with essentially the entire RCA circumferential calcification. The proximal portions of the LAD and circumflex also have almost circumferential calcification.  Dist - apical LAD lesion is 70% stenosed.  Small Caliber 1st Diag: Ost 1st Diag to 1st Diag lesion is 65% stenosed.  Small Caliber 2nd Diag: Ost 2nd Diag lesion is 40% stenosed. 2nd Diag lesion is 75% stenosed.  Prox Cx lesion is 50% stenosed.  Ost RCA to Prox RCA lesion is 45% stenosed. Prox RCA lesion is 50% stenosed.  Dist RCA-1 lesion is 75% stenosed. Dist RCA-2 lesion is 40% stenosed with 95% stenosed side branch in Ost RPDA. RPDA lesion is 85% stenosed.    Hemodynamically moderate to severe aortic stenosis with heavily calcified aortic valve.  Moderate combined pulmonary hypertension with elevated LVEDP  Heavily calcified native coronary arteries with minimal disease in the LAD and circumflex system.  Severe disease in the distal RCA-RPDA (best  treated medically due to extensive calcification and distal nature of disease)   Plan: return to nursing unit after sheath removal in PACU Holding area She will be seen by Dr. Burt Knack from TAVR team to discuss options. RCA lesions are potential culprits for angina, but would be challenging PCI targets & would require femoral access (also potential atherectomy).  History of Present Illness    Cassidy [name] is a 84 y.o with a hx of CAD s/p remote stenting to RCA (2001), permanent atrial fib (not on [place]), HTN, DMT2, HLD, CKD, aortic dilation,dementiaand severe aortic stenosiswho is beingconsidered for TAVR versus palliative management of aortic stenosis and other significant comorbidities including dementia and decreased exertional ability. Underwent cath 03/05/2018, moderate severe RCA disease involving RPDA, medical management recommended due to extensive calcification  Pt stated that she was sitting in the back of a car on day of admission coming home from beauty shop and felt like she was getting overheated. However, she otherwise felt OK until she got inside. While at rest, she began to feel diffuse chest pressure, shortness of breath, and clamminess. This felt just like the "spell" that had prompted her last admission. She reported not having any palpitations or dizziness. She then alerted her family right away who called EMS. She was given 324mg  ASA. Initial CBG was 52 so she was given snacks to eat. She reported that her CP had resolved by the time she got to the ER. Telemetry revealed persistent atrial fib with bradycardia, at times down to upper 30s. Trop 0.03 regular and 0.02 POC, BNP 110, Cr 1.26 (previously 1.07), CBC stable. She initially denied SOB but family  states she does "huff and puff" with walking.  Per chart review, outpatient notes indicate she previously declined anticoagulation. Dr. Irish Lack has felt this is reasonable given her fall history per 12/2016 OV. She was recently  admitted for chest pain and troponin 0.03. Echo showed progression to severe AS. Her chest pain was somewhat atypical and so Dr. Sallyanne Kuster felt she was appropriate for outpatient workup. Lexiscan nuclear stress test. Dr. Sallyanne Kuster did feel she would require AVR within the next 6-12 months but did not think she would be suitable for SAVR, but instead would consider TAVR instead. Regarding anticoagulation, family reported only one fall since last OV but anticoagulation was not pursued as it was felt she would likely need cath and TAVR at some point. 2D echo 02/20/18 showed severe LVH, EF 60-65%, severely restricted AV with severe stenosis, mild MR/AI, mod LAE, PASP 80mmHg. CXR no acute abnormality. Lexiscan was scheduled for this Friday but she presented back with more CP in the interim.  Hospital Course     Given her symptoms of chest pain and severe aortic stenosis, plan was for [facility] planned for 03/05/18. Her troponin levels have remained flat. Her atrial fibrillation has remained slow. Creatinine has remained stable, but elevated. She was started on prophylactic Hep gtt secondary to not being anticoagulated for atrial fibrillation due to recent falls.  On 03/05/2018 she was taken to the cardiac cath lab which revealed heavily calcified native coronary arteries with minimal disease in the LAD and circumflex system, severe disease in the distal RCA-RPDA (best treated medically due to extensive calcification and distal nature of disease) and moderate to severe AS with heavily calcified aortic valve.  Plan was to read by Dr. Burt Knack from the TAVR team to discuss further AS options. Per cath note, RCA lesions are potential culprits for angina, but would be challenging PCI targets & would require femoral access (also potential atherectomy).  Other hospital problems include: Patient had a fall chair on 03/06/2018 without loss of consciousness.  Patient noted pain in her right hip and right shoulder in which x-rays were  performed to rule out fracture which were negative.  Dr. Burt Knack saw the patient on 03/06/2018 in which the patient very clearly stated that she did not wish to undergo any further testing for heart valve replacement and specifically does not want to undergo heart valve replacement even if it is done in a minimally invasive fashion.  MD noted to agree with patient's wishes, feeling that is very reasonable as she was likely not to have much benefit from TAVR considering her poor functional capacity. Wishes were communicated to family of her decision  After this decision, family did not feel that they could take care of the patient at home and plans were initiated for possible SNF placement.  During this time a PT consultation was placed to determine safety for discharge and mentations for rehabilitation or permanent SNF placement.  1. Severe aortic stenosis:  -Evaluated by Dr. Burt Knack for TAVR, however family and patient expressed wish to persue conservative approach instead>>>and have now opted for palliative care placement  -No further workup at this time  2. Moderately severe RCA CAD:  -Best managed by medical therapy  3. Chronic atrial fibrillation:  -Was on ASA with no AV nodal blocking agents -No anticoagulation secondary to hx of recent falls  4. CKD Stage III: -Stable, 1.26 on admission.  Peak creatinine, 1.26 -Baseline appears to be in the 1.0-1.2 range  5. Deconditioning: -No ambulation since hospitalization,  seen by PT today with recommendations for SNF or palliative care -D/C to [facility] today   6. DM 2: -Metformin on hold at admission  -SSI for glucose control while inpatient   Consultants: Palliative Care Medicine>>>pt will be discharged to Palliative Care and placed on comfort measures     Patient has been seen and examined by Dr. Meda Coffee who feels that she is stable and ready for discharge to [facility] facility on 03/10/2018 _____________  Discharge Vitals Blood  pressure 129/64, pulse 77, temperature 97.7 F (36.5 C), temperature source Oral, resp. rate 17, height 4\' 11"  (1.499 m), weight 192 lb 14.4 oz (87.5 kg), SpO2 90 %.  Filed Weights   03/08/18 0130 03/09/18 0522 03/10/18 0431  Weight: 194 lb 7.1 oz (88.2 kg) 191 lb 9.3 oz (86.9 kg) 192 lb 14.4 oz (87.5 kg)   Labs & Radiologic Studies    Dg Chest 2 View  Result Date: 02/19/2018 CLINICAL DATA:  Chest pain EXAM: CHEST - 2 VIEW COMPARISON:  08/10/2017. FINDINGS: Cardiomegaly. Mild bilateral interstitial prominence. Mild left base subsegmental atelectasis. No pleural effusion or pneumothorax. IMPRESSION: 1. Cardiomegaly. Mild bilateral interstitial prominence. Mild CHF could present and this fashion. 2.  Mild left base subsegmental atelectasis/infiltrate. Electronically Signed   By: Marcello Moores  Register   On: 02/19/2018 13:41   Dg Shoulder Right  Result Date: 03/06/2018 CLINICAL DATA:  Pt complains of right shoulder and right hip pain since falling out of her hospital chair this AM EXAM: RIGHT SHOULDER - 2+ VIEW COMPARISON:  Chest x-ray dated 03/03/2018. Plain film of the RIGHT shoulder dated 03/18/2007. FINDINGS: Old healed fracture of the RIGHT humeral neck. No acute fracture or dislocation. Stable diastasis at the acromioclavicular joint space. Soft tissues about the RIGHT shoulder are unremarkable. IMPRESSION: No acute findings.  Old healed fracture of the RIGHT humeral neck. Electronically Signed   By: Franki Cabot M.D.   On: 03/06/2018 15:00   Dg Chest Portable 1 View  Result Date: 03/03/2018 CLINICAL DATA:  Acute chest pain. EXAM: PORTABLE CHEST 1 VIEW COMPARISON:  02/19/2018 FINDINGS: The cardiomediastinal silhouette is unremarkable. Interstitial prominence again noted. There is no evidence of focal airspace disease, pulmonary edema, suspicious pulmonary nodule/mass, pleural effusion, or pneumothorax. No acute bony abnormalities are identified. Remote RIGHT humeral fracture again noted. IMPRESSION: No  acute abnormality. Electronically Signed   By: Margarette Canada M.D.   On: 03/03/2018 16:21   Ct Angio Chest/abd/pel For Dissection W And/or Wo Contrast  Result Date: 02/19/2018 CLINICAL DATA:  Acute chest and back pain. EXAM: CT ANGIOGRAPHY CHEST, ABDOMEN AND PELVIS TECHNIQUE: Multidetector CT imaging through the chest, abdomen and pelvis was performed using the standard protocol during bolus administration of intravenous contrast. Multiplanar reconstructed images and MIPs were obtained and reviewed to evaluate the vascular anatomy. CONTRAST:  80 mL ISOVUE-370 IOPAMIDOL (ISOVUE-370) INJECTION 76% COMPARISON:  CT scan of August 30, 2004. FINDINGS: CTA CHEST FINDINGS Cardiovascular: Atherosclerosis of thoracic aorta is noted without aneurysm or dissection. No pericardial effusion is noted. Mild coronary artery calcifications are noted. Mediastinum/Nodes: No enlarged mediastinal, hilar, or axillary lymph nodes. Thyroid gland, trachea, and esophagus demonstrate no significant findings. Lungs/Pleura: No pneumothorax or pleural effusion is noted. Mosaic pattern is seen involving the lungs most consistent with small airways disease. Musculoskeletal: No chest wall abnormality. No acute or significant osseous findings. Review of the MIP images confirms the above findings. CTA ABDOMEN AND PELVIS FINDINGS VASCULAR Aorta: Atherosclerosis of abdominal aorta is noted without aneurysm or dissection.  Celiac: Patent without evidence of aneurysm, dissection, vasculitis or significant stenosis. SMA: Patent without evidence of aneurysm, dissection, vasculitis or significant stenosis. Renals: Both renal arteries are patent without evidence of aneurysm, dissection, vasculitis, fibromuscular dysplasia or significant stenosis. IMA: Patent without evidence of aneurysm, dissection, vasculitis or significant stenosis. Inflow: Patent without evidence of aneurysm, dissection, vasculitis or significant stenosis. Veins: No obvious venous  abnormality within the limitations of this arterial phase study. Review of the MIP images confirms the above findings. NON-VASCULAR Hepatobiliary: No focal liver abnormality is seen. No gallstones, gallbladder wall thickening, or biliary dilatation. Pancreas: Unremarkable. No pancreatic ductal dilatation or surrounding inflammatory changes. Spleen: Normal in size without focal abnormality. Adrenals/Urinary Tract: Adrenal glands are unremarkable. Kidneys are normal, without renal calculi, focal lesion, or hydronephrosis. Bladder is unremarkable. Stomach/Bowel: The stomach appears normal. There is no evidence of bowel obstruction or inflammation. Status post appendectomy. Sigmoid diverticulosis is noted without inflammation. Lymphatic: No significant adenopathy is noted. Reproductive: Status post hysterectomy. No adnexal masses. Other: No abdominal wall hernia or abnormality. No abdominopelvic ascites. Musculoskeletal: No acute or significant osseous findings. Review of the MIP images confirms the above findings. IMPRESSION: No evidence of thoracic or abdominal aortic dissection or aneurysm. Mosaic pattern is noted throughout both lungs most consistent with small airways disease. Sigmoid diverticulosis is noted without inflammation. Aortic Atherosclerosis (ICD10-I70.0). Electronically Signed   By: Marijo Conception, M.D.   On: 02/19/2018 18:03   Dg Hip Unilat With Pelvis 1v Right  Result Date: 03/06/2018 CLINICAL DATA:  Pt complains of right shoulder and right hip pain since falling out of her hospital chair this AM EXAM: DG HIP (WITH OR WITHOUT PELVIS) 1V RIGHT COMPARISON:  None. FINDINGS: Single view of the pelvis and single view of the RIGHT hip are provided. Osseous alignment appears normal. No fracture line or displaced fracture fragment identified. Presumed hernia repair hardware overlying the LEFT groin region. Soft tissues about the pelvis and RIGHT hip are otherwise unremarkable. IMPRESSION: No acute  findings.  No osseous fracture or dislocation seen. Electronically Signed   By: Franki Cabot M.D.   On: 03/06/2018 14:59   Disposition   Pt is being discharged to palliative care at [facility] today in good condition.  Follow-up Plans & Appointments    Discharge Instructions    Call MD for:  persistant dizziness or light-headedness   Complete by:  As directed    Call MD for:  persistant nausea and vomiting   Complete by:  As directed    Call MD for:  redness, tenderness, or signs of infection (pain, swelling, redness, odor or green/yellow discharge around incision site)   Complete by:  As directed    Call MD for:  severe uncontrolled pain   Complete by:  As directed    Call MD for:  temperature >100.4   Complete by:  As directed    Diet - low sodium heart healthy   Complete by:  As directed    Increase activity slowly   Complete by:  As directed       Discharge Medications   Allergies as of 03/10/2018      Reactions   Latex Rash      Medication List    STOP taking these medications   ALPRAZolam 1 MG tablet Commonly known as:  XANAX     TAKE these medications   acetaminophen 500 MG tablet Commonly known as:  TYLENOL Take 1,000 mg by mouth every 8 (eight) hours as needed (  for pain).   albuterol (2.5 MG/3ML) 0.083% nebulizer solution Commonly known as:  PROVENTIL Take 3 mLs by nebulization every 4 (four) hours as needed for wheezing.   amitriptyline 10 MG tablet Commonly known as:  ELAVIL Take 1 tablet (10 mg total) by mouth at bedtime as needed for sleep.   aspirin EC 325 MG tablet Take 1 tablet (325 mg total) by mouth daily. With Food   atorvastatin 10 MG tablet Commonly known as:  LIPITOR Take 1 tablet (10 mg total) by mouth every Monday, Wednesday, and Friday.   AZO CRANBERRY URINARY TRACT 250-60 MG Caps Generic drug:  Cranberry-Vitamin C Take 2 capsules by mouth daily with breakfast.   CALTRATE 600+D3 PO Take 1 tablet by mouth daily with  breakfast.   CULTURELLE Caps Take 1 capsule by mouth daily.   donepezil 10 MG tablet Commonly known as:  ARICEPT Take 10 mg by mouth at bedtime.   fluconazole 150 MG tablet Commonly known as:  DIFLUCAN Take 150 mg by mouth once as needed (for intermittent yeast infections).   loratadine 10 MG tablet Commonly known as:  CLARITIN Take 10 mg by mouth daily.   losartan-hydrochlorothiazide 100-12.5 MG tablet Commonly known as:  HYZAAR Take 1 tablet by mouth daily with breakfast.   METAMUCIL FIBER PO Take 10 g by mouth daily with breakfast.   metFORMIN 500 MG tablet Commonly known as:  GLUCOPHAGE Take 500 mg by mouth 2 (two) times daily with a meal.   NOVOLIN 70/30 RELION (70-30) 100 UNIT/ML injection Generic drug:  insulin NPH-regular Human Inject 15-28 Units into the skin See admin instructions. Inject 28 units into the skin after breakfast and 15 units at bedtime   nystatin cream Commonly known as:  MYCOSTATIN Apply 1 application topically 3 (three) times daily as needed (for vaginal yeast infections).   oxybutynin 10 MG 24 hr tablet Commonly known as:  DITROPAN-XL Take 10 mg by mouth daily with breakfast.   sorbitol 70 % Soln Take 20 mLs by mouth daily as needed for moderate constipation.        Acute coronary syndrome (MI, NSTEMI, STEMI, etc) this admission?: No.    Outstanding Labs/Studies   None   Duration of Discharge Encounter   Greater than 30 minutes including physician time.  SignedKathyrn Drown, NP 03/10/2018, 4:23 PM   The patient is sleeping, no chest pain or SOB overnight. We are awaiting urinalysis for daughters concerns of UTI> Discharge to a hospice today.  Ena Dawley, MD 03/11/2018

## 2018-03-10 DIAGNOSIS — Z515 Encounter for palliative care: Secondary | ICD-10-CM

## 2018-03-10 DIAGNOSIS — Z7189 Other specified counseling: Secondary | ICD-10-CM

## 2018-03-10 LAB — GLUCOSE, CAPILLARY
GLUCOSE-CAPILLARY: 144 mg/dL — AB (ref 70–99)
GLUCOSE-CAPILLARY: 159 mg/dL — AB (ref 70–99)

## 2018-03-10 MED ORDER — LORAZEPAM 2 MG/ML IJ SOLN
INTRAMUSCULAR | Status: AC
  Filled 2018-03-10: qty 1

## 2018-03-10 MED ORDER — OXYCODONE HCL 5 MG/5ML PO SOLN
5.0000 mg | ORAL | Status: DC | PRN
  Administered 2018-03-10: 5 mg via ORAL

## 2018-03-10 MED ORDER — LOSARTAN POTASSIUM 50 MG PO TABS
50.0000 mg | ORAL_TABLET | Freq: Every day | ORAL | Status: DC
  Administered 2018-03-10: 50 mg via ORAL
  Filled 2018-03-10: qty 1

## 2018-03-10 MED ORDER — HYDROMORPHONE HCL 1 MG/ML IJ SOLN
0.5000 mg | INTRAMUSCULAR | Status: DC | PRN
  Administered 2018-03-11 (×4): 0.5 mg via INTRAVENOUS
  Filled 2018-03-10 (×5): qty 0.5

## 2018-03-10 MED ORDER — LORAZEPAM 2 MG/ML IJ SOLN
1.0000 mg | INTRAMUSCULAR | Status: DC | PRN
  Administered 2018-03-10: 1 mg via INTRAVENOUS

## 2018-03-10 NOTE — Progress Notes (Signed)
Daily Progress Note   Patient Name: Cassidy [name]       Date: 03/10/2018 DOB: 1927/06/09  Age: 84 y.o. MRN#: [id number] Attending Physician: Buford Dresser, * Primary Care Physician: [name], MD Admit Date: 03/03/2018  Reason for Consultation/Follow-up: Establishing goals of care  Subjective: Patient in bed, lunch tray is untouched. She declines to be fed or wanting to eat. Noted PT note that participation with PT was impossible due to diffuse pain. Patient denies pain at this time. She is able to tell me her name, not otherwise oriented or able to participate in conversation.  I spoke with Myna Hidalgo and she has decided to transition to comfort care and proceed with referral for placement at residential hospice.   Review of Systems  Unable to perform ROS: Medical condition    Length of Stay: 6  Current Medications: Scheduled Meds:  . ALPRAZolam  1 mg Oral QHS  . aspirin EC  81 mg Oral Daily  . atorvastatin  10 mg Oral Q M,W,F  . heparin  5,000 Units Subcutaneous Q8H  . insulin aspart  0-15 Units Subcutaneous TID WC  . loratadine  10 mg Oral Daily  . losartan  50 mg Oral Daily  . oxybutynin  10 mg Oral Q breakfast  . psyllium  1 packet Oral Q breakfast  . sodium chloride flush  3 mL Intravenous Q12H  . sodium chloride flush  3 mL Intravenous Q12H    Continuous Infusions: . sodium chloride    . sodium chloride      PRN Meds: sodium chloride, sodium chloride, acetaminophen, albuterol, ALPRAZolam, amitriptyline, nystatin, ondansetron (ZOFRAN) IV, oxyCODONE, sodium chloride flush, sodium chloride flush, sorbitol  Physical Exam  Constitutional: She appears well-developed and well-nourished.  HENT:  Head: Normocephalic and atraumatic.  Cardiovascular: Normal  rate and regular rhythm.  Pulmonary/Chest: Effort normal.  Skin: Skin is warm.  Psychiatric:  Flat affect  Nursing note and vitals reviewed.           Vital Signs: BP (!) 111/47 (BP Location: Right Wrist)   Pulse 61   Temp 97.7 F (36.5 C) (Oral)   Resp 18   Ht 4\' 11"  (1.499 m)   Wt 87.5 kg (192 lb 14.4 oz)   SpO2 100%   BMI 38.96 kg/m  SpO2: SpO2: 100 %  O2 Device: O2 Device: Nasal Cannula O2 Flow Rate: O2 Flow Rate (L/min): 2 L/min  Intake/output summary:   Intake/Output Summary (Last 24 hours) at 03/10/2018 1318 Last data filed at 03/09/2018 1831 Gross per 24 hour  Intake 60 ml  Output -  Net 60 ml   LBM: Last BM Date: 03/06/18 Baseline Weight: Weight: 85.2 kg (187 lb 13.3 oz) Most recent weight: Weight: 87.5 kg (192 lb 14.4 oz)       Palliative Assessment/Data: PPS: 20%      Patient Active Problem List   Diagnosis Date Noted  . Advance care planning   . Goals of care, counseling/discussion   . Palliative care by specialist   . CKD (chronic kidney disease), stage III ([place]) 03/03/2018  . Lower extremity edema 03/03/2018  . Atypical chest pain 02/19/2018  . PUD (peptic ulcer disease) 02/19/2018  . Sepsis ([place]) 07/22/2016  . UTI (urinary tract infection) 07/22/2016  . Duodenal ulcer 11/07/2013  . Gastric ulcer with hemorrhage 11/07/2013  . Upper GI bleed 11/06/2013  . Unspecified constipation 09/19/2013  . Family history of malignant neoplasm of gastrointestinal tract 09/19/2013  . Preop cardiovascular exam 09/11/2011  . Coronary artery disease involving native coronary artery of native heart with angina pectoris ([place])   . Permanent atrial fibrillation ([place])   . Essential hypertension   . Diverticulosis   . Colon polyp   . Severe aortic stenosis   . Ascending aorta dilatation (HCC)   . Asthma   . Obesity   . Dyslipidemia   . Ejection fraction   . Diabetes mellitus type 2 in obese ([place])   . Falling episodes     Palliative Care Assessment & Plan    Patient Profile: 84 y.o. female  with past medical history of CAD, HTN, DMT2, HLD, GERD, CKD, dementia admitted on 03/03/2018 with recurrent episode of chest pain. Workup reveals severe aortic stenosis. She had a cath additionally showing severe RCA disease with occlusion. She has declined TAVR. Palliative medicine consulted for [place].   Assessment/Recommendations/Plan   Transition to comfort care  Request placement at residential Hospice- family requests [facility] for proximity  Continue oxycodone solution 5mg  q2hr prn for pain  Xanax .25mg  tid prn for anxiety  Agree with PT note that PT is no longer indicated- will cancel order  Goals of Care and Additional Recommendations:  Limitations on Scope of Treatment: Full Comfort Care  Code Status:  DNR  Prognosis:   < 2 weeks- d/t severe aortic stenosis, recurrent angina, AMS, no po intake, transition to full comfort measures  Discharge Planning:  Hospice facility  Care plan was discussed with patient's daughter- [name].   Thank you for allowing the Palliative Medicine Team to assist in the care of this patient.   Time In: 1300 Time Out: 1340 Total Time 40 mins Prolonged Time Billed no      Greater than 50%  of this time was spent counseling and coordinating care related to the above assessment and plan.  Mariana Kaufman, AGNP-C Palliative Medicine   Please contact Palliative Medicine Team phone at [phone] for questions and concerns.

## 2018-03-10 NOTE — Progress Notes (Signed)
Physical Therapy Treatment Patient Details Name: Cassidy [name] MRN: [id number] DOB: 1926-09-13 Today's Date: 03/10/2018    History of Present Illness 84 yo admitted with [place] and AS not a candidate for TAVR. PMHx: Afib, HTN, DM, CAD    PT Comments    Continuing work on functional mobility and activity tolerance;  Transfers OOB to recliner significantly limited by pain (reports "all over" when asked), and decr activity tolerance; Noted Palliative Care  Consult, and that family is considering Goals of Care -- my concern is that based on today's session, PT intervention is not comfortable and palliative; PMT please advise: is PT congruent with Ms. Manwarren Goals of Care?   Follow Up Recommendations  SNF;Supervision/Assistance - 24 hour     Equipment Recommendations  Other (comment)(TBD)    Recommendations for Other Services       Precautions / Restrictions Precautions Precautions: Fall    Mobility  Bed Mobility Overal bed mobility: Needs Assistance Bed Mobility: Rolling;Sidelying to Sit Rolling: Mod assist Sidelying to sit: Max assist;+2 for physical assistance       General bed mobility comments: Mod A +2 to bring BLEs towards EOB and then transition hips towards EOB with bed pad. Pt requiring assistance to elevate trunk. Once at EOB, pt able to maintain sitting posture without physical A initially, but with incr time, drifted into posterior lean  Transfers Overall transfer level: Needs assistance Equipment used: 2 person hand held assist Transfers: Lateral/Scoot Transfers          Lateral/Scoot Transfers: Max assist;+2 physical assistance General transfer comment: Painful today with less activity tolerance as well; Attempt at standing unsuccessful, so used folded blanket as drawsheet to lateral scoot Ms. Key OOB to recliner  Ambulation/Gait                 Stairs             Wheelchair Mobility    Modified Rankin (Stroke Patients Only)        Balance     Sitting balance-Leahy Scale: Poor       Standing balance-Leahy Scale: Zero                              Cognition Arousal/Alertness: Awake/alert Behavior During Therapy: Flat affect Overall Cognitive Status: Impaired/Different from baseline Area of Impairment: Attention;Following commands;Memory;Safety/judgement;Awareness;Problem solving;Orientation                 Orientation Level: Disoriented to;Situation;Time Current Attention Level: Sustained Memory: Decreased short-term memory Following Commands: Follows one step commands inconsistently Safety/Judgement: Decreased awareness of safety;Decreased awareness of deficits Awareness: Intellectual Problem Solving: Slow processing;Decreased initiation;Difficulty sequencing;Requires verbal cues;Requires tactile cues        Exercises      General Comments General comments (skin integrity, edema, etc.): VSS      Pertinent Vitals/Pain Pain Assessment: Faces Faces Pain Scale: Hurts worst Pain Location: "all over"; noted grimace and calling out when moving LEs, LLE more painful than R Pain Descriptors / Indicators: Grimacing;Guarding;Crying Pain Intervention(s): Repositioned;Other (comment)(Notified RN)    Home Living                      Prior Function            PT Goals (current goals can now be found in the care plan section) Acute Rehab PT Goals Patient Stated Goal: Unstated PT Goal Formulation: Patient unable to participate in goal  setting Time For Goal Achievement: 03/22/18 Potential to Achieve Goals: Fair Progress towards PT goals: Not progressing toward goals - comment(Limited by pain and decr activity tolernace today)    Frequency    Min 2X/week      PT Plan Current plan remains appropriate    Co-evaluation              AM-PAC PT "6 Clicks" Daily Activity  Outcome Measure  Difficulty turning over in bed (including adjusting bedclothes, sheets  and blankets)?: Unable Difficulty moving from lying on back to sitting on the side of the bed? : Unable Difficulty sitting down on and standing up from a chair with arms (e.g., wheelchair, bedside commode, etc,.)?: Unable Help needed moving to and from a bed to chair (including a wheelchair)?: Total Help needed walking in hospital room?: Total Help needed climbing 3-5 steps with a railing? : Total 6 Click Score: 6    End of Session Equipment Utilized During Treatment: Gait belt(bed pad) Activity Tolerance: Patient limited by pain Patient left: in chair;with call bell/phone within reach;with chair alarm set;with nursing/sitter in room Nurse Communication: Mobility status;Need for lift equipment PT Visit Diagnosis: Other abnormalities of gait and mobility (R26.89);Muscle weakness (generalized) (M62.81);Unsteadiness on feet (R26.81)     Time: 1100-1113 PT Time Calculation (min) (ACUTE ONLY): 13 min  Charges:  $Therapeutic Activity: 8-22 mins                    G Codes:       Roney Marion, PT  Acute Rehabilitation Services Pager [phone] Office [phone]    Colletta [place] 03/10/2018, 12:56 PM

## 2018-03-10 NOTE — Progress Notes (Signed)
Progress Note  Patient Name: Cassidy [name] Date of Encounter: 03/10/2018  Primary Cardiologist: Larae Grooms, MD   Subjective   Denies any chest pain or SOB, feels sore from being in bed, no appetite.  Inpatient Medications    Scheduled Meds: . ALPRAZolam  1 mg Oral QHS  . aspirin EC  81 mg Oral Daily  . atorvastatin  10 mg Oral Q M,W,F  . heparin  5,000 Units Subcutaneous Q8H  . insulin aspart  0-15 Units Subcutaneous TID WC  . loratadine  10 mg Oral Daily  . losartan  100 mg Oral Daily  . oxybutynin  10 mg Oral Q breakfast  . psyllium  1 packet Oral Q breakfast  . sodium chloride flush  3 mL Intravenous Q12H  . sodium chloride flush  3 mL Intravenous Q12H   Continuous Infusions: . sodium chloride    . sodium chloride     PRN Meds: sodium chloride, sodium chloride, acetaminophen, albuterol, ALPRAZolam, amitriptyline, nystatin, ondansetron (ZOFRAN) IV, oxyCODONE, sodium chloride flush, sodium chloride flush, sorbitol   Vital Signs    Vitals:   03/09/18 2310 03/10/18 0348 03/10/18 0355 03/10/18 0431  BP: (!) 92/48 92/73 (!) 105/55   Pulse: 69 79 (!) 45   Resp: 11 14 16    Temp: 98.6 F (37 C) 97.9 F (36.6 C)    TempSrc: Oral Oral    SpO2: 99% 93% 91%   Weight:    192 lb 14.4 oz (87.5 kg)  Height:        Intake/Output Summary (Last 24 hours) at 03/10/2018 0751 Last data filed at 03/09/2018 1831 Gross per 24 hour  Intake 300 ml  Output -  Net 300 ml   Filed Weights   03/08/18 0130 03/09/18 0522 03/10/18 0431  Weight: 194 lb 7.1 oz (88.2 kg) 191 lb 9.3 oz (86.9 kg) 192 lb 14.4 oz (87.5 kg)    Telemetry    Atrial fibrillation, self rate controlled - Personally Reviewed  ECG    No new EKG   Physical Exam   GEN: sleepy  Neck: No JVD Cardiac: irregularly irregular, no rubs, or gallops. 2/6 systolic murmur at RUSB Respiratory: Clear to auscultation bilaterally. GI: Soft, nontender, non-distended  MS: No edema; No deformity. Neuro:  Nonfocal    Psych: Normal affect   Labs    Chemistry Recent Labs  Lab 03/03/18 1614 03/05/18 0301 03/06/18 0232 03/07/18 0327  NA 140 140 140 137  K 4.3 4.2 4.3 4.6  CL 98 102 103 102  CO2 31 33* 30 31  GLUCOSE 124* 198* 188* 203*  BUN 20 22 18 23   CREATININE 1.26* 1.18* 1.09* 1.23*  CALCIUM 10.0 9.0 8.9 8.8*  PROT 6.6  --   --   --   ALBUMIN 3.3*  --   --   --   AST 27  --   --   --   ALT 12  --   --   --   ALKPHOS 56  --   --   --   BILITOT 0.7  --   --   --   GFRNONAA 37* 40* 44* 38*  GFRAA 42* 46* 51* 44*  ANIONGAP 11 5 7  4*     Hematology Recent Labs  Lab 03/05/18 0301 03/06/18 0232 03/07/18 0327  WBC 9.6 9.4 9.9  RBC 4.75 4.60 4.41  HGB 12.0 11.6* 11.3*  HCT 39.5 39.2 38.1  MCV 83.2 85.2 86.4  MCH 25.3* 25.2* 25.6*  MCHC 30.4 29.6* 29.7*  RDW 17.4* 17.9* 17.6*  PLT 315 323 277    Cardiac Enzymes Recent Labs  Lab 03/03/18 1614 03/04/18 0233 03/04/18 0800 03/04/18 1434  TROPONINI 0.03* 0.03* 0.03* 0.03*    Recent Labs  Lab 03/03/18 1755  TROPIPOC 0.02     BNP Recent Labs  Lab 03/03/18 1614  BNP 110.0*     DDimer No results for input(s): DDIMER in the last 168 hours.   Radiology    No results found.  Cardiac Studies   Echo 02/20/2018 LV EF: 55% -   60% Study Conclusions  - Left ventricle: The cavity size was normal. Wall thickness was   increased in a pattern of severe LVH. Systolic function was   normal. The estimated ejection fraction was in the range of 55%   to 60%. Wall motion was normal; there were no regional wall   motion abnormalities. - Aortic valve: Valve mobility was severely restricted.   Transvalvular velocity was increased. There was severe stenosis.   There was mild regurgitation. Valve area (VTI): 1.06 cm^2. Valve   area (Vmax): 1.02 cm^2. Valve area (Vmean): 1.1 cm^2. - Aortic root: The aortic root was mildly dilated. - Mitral valve: Mildly calcified annulus. There was mild   regurgitation. - Left atrium: The  atrium was moderately dilated. - Pulmonary arteries: Systolic pressure was mildly to moderately   increased. PA peak pressure: 41 mm Hg (S).    Cath 03/05/2018 Conclusion     Hemodynamic findings consistent with moderate pulmonary hypertension.  LV end diastolic pressure is severely elevated.  Heavily calcified coronary arteries with essentially the entire RCA circumferential calcification. The proximal portions of the LAD and circumflex also have almost circumferential calcification.  Dist - apical LAD lesion is 70% stenosed.  Small Caliber 1st Diag: Ost 1st Diag to 1st Diag lesion is 65% stenosed.  Small Caliber 2nd Diag: Ost 2nd Diag lesion is 40% stenosed. 2nd Diag lesion is 75% stenosed.  Prox Cx lesion is 50% stenosed.  Ost RCA to Prox RCA lesion is 45% stenosed. Prox RCA lesion is 50% stenosed.  Dist RCA-1 lesion is 75% stenosed. Dist RCA-2 lesion is 40% stenosed with 95% stenosed side branch in Ost RPDA. RPDA lesion is 85% stenosed.    Hemodynamically moderate to severe aortic stenosis with heavily calcified aortic valve.  Moderate combined pulmonary hypertension with elevated LVEDP  Heavily calcified native coronary arteries with minimal disease in the LAD and circumflex system.  Severe disease in the distal RCA-RPDA (best treated medically due to extensive calcification and distal nature of disease)   Plan: return to nursing unit after sheath removal in PACU Holding area She will be seen by Dr. Burt Knack from TAVR team to discuss options. RCA lesions are potential culprits for angina, but would be challenging PCI targets & would require femoral access (also potential atherectomy).      Patient Profile     84 y.o. female with a hx of CAD s/p remote stenting to RCA (2001), permanent atrial fib (not on [place]), HTN, DMT2, HLD, CKD, aortic dilation,dementiaand severe aortic stenosiswho is being considered for TAVR versus palliative management of aortic stenosis and  other significant comorbidities including dementia and decreased exertional ability. Underwent cath 03/05/2018, moderate severe RCA disease involving RPDA, medical management recommended due to extensive calcification  Assessment & Plan    1. Severe aortic stenosis: evaluated by Dr. Burt Knack for TAVR, family and patient expressed wish to persue conservative approach instead. No further workup  at this time 2. Moderately severe RCA CAD: best managed by medical therapy 3. Chronic atrial fibrillation: on ASA, no AV nodal blocking agent.  4. CKD: stable.  5. Hypertension- I will decrease her losartan to 50 mg po daily as she is now hypotensive, possibly sec to poor oral intake 5. Deconditioning: no ambulation since hospitalization, seen by PT today, recommend SNF. D/C once set up with SNF  The patient is well compensated, still in the hospital for social issues, the daughter doesn't feel like she can manage care at home but doesn't want a SNF either, /palliative care evaluated yesterday, no note yet, hopefully there will be a plan for a placement today.    Ena Dawley, MD 03/10/2018

## 2018-03-10 NOTE — Progress Notes (Addendum)
Patient crying out, extremely upset and in pain. Palliative team paged for additional pain medication.  Cardiology paged as well, returned page and changed order to oxycodone every 1 hour as needed.

## 2018-03-10 NOTE — Clinical Social Work Note (Addendum)
CSW left voicemail for [facility] admissions coordinator. Will make referral when she calls back.  Dayton Scrape, [place] [phone]  1:47 pm Received call back from [facility] coordinator. They do have beds open. She will review referral for appropriateness and call back.  Dayton Scrape, [place] [phone]  2:33 pm [facility] is able to accept patient today. Unable to leave voicemail for daughter. [facility] admissions coordinator is on her way to the hospital.  Dayton Scrape, [place]  3:47 pm Patient's daughter is requesting a urine analysis. RN and NP aware and will complete. CSW will follow up with [facility] tomorrow.  Dayton Scrape, [place]

## 2018-03-10 NOTE — Progress Notes (Signed)
2C -17 patient requested a Bible and chaplain took a Bible for patient-didn't have chance to visit due to another call.   03/10/18 0100  Clinical Encounter Type  Visited With Health care provider  Visit Type Other (Comment) (Bible was requested)  Referral From Nurse  Consult/Referral To Chaplain  Spiritual Encounters  Spiritual Needs [facility]  text  Stress Factors  Patient Stress Factors Not reviewed  Family Stress Factors Not reviewed

## 2018-03-10 NOTE — Care Management (Signed)
  03/10/2018 Pt to discharge to [facility]  03/08/18 16:00 CM spoke with daughter - daughter declined to speak with CM regarding discharge planning including home/HH and DME - daughter to contact attending group once she arrives at hospital this evening - attending group aware  3:45pm CM informed by CSW that daughter does not wish for pt to discharge to SNF - daughter would like for pt to discharge home.  CM contacted attending PA and informed of discharge plan change - NP will discuss with attending and determine if pt will be home with hospice appropriate  10:00 am: CM spoke with attending PA and was informed that pt was medically stable for discharge to SNF today.

## 2018-03-11 DIAGNOSIS — M549 Dorsalgia, unspecified: Secondary | ICD-10-CM

## 2018-03-11 DIAGNOSIS — W07XXXA Fall from chair, initial encounter: Secondary | ICD-10-CM

## 2018-03-11 LAB — URINALYSIS, ROUTINE W REFLEX MICROSCOPIC
Glucose, UA: NEGATIVE mg/dL
Ketones, ur: NEGATIVE mg/dL
Nitrite: NEGATIVE
Protein, ur: 100 mg/dL — AB
Specific Gravity, Urine: 1.02 (ref 1.005–1.030)
pH: 6.5 (ref 5.0–8.0)

## 2018-03-11 LAB — URINALYSIS, MICROSCOPIC (REFLEX): WBC, UA: >50 WBC/hpf (ref 0–5)

## 2018-03-11 MED ORDER — LORAZEPAM 2 MG/ML IJ SOLN: 1.0000 mg | INTRAMUSCULAR | 0 refills | Status: AC | PRN

## 2018-03-11 MED ORDER — HALOPERIDOL LACTATE 5 MG/ML IJ SOLN: 0.5000 mg | INTRAMUSCULAR | Status: DC | PRN

## 2018-03-11 MED ORDER — HALOPERIDOL LACTATE 5 MG/ML IJ SOLN: 0.5000 mg | INTRAMUSCULAR | Status: AC | PRN

## 2018-03-11 MED ORDER — HYDROMORPHONE HCL 1 MG/ML IJ SOLN: 0.5000 mg | INTRAMUSCULAR | 0 refills | Status: AC | PRN

## 2018-03-11 MED ORDER — GLYCOPYRROLATE 0.2 MG/ML IJ SOLN: 0.2000 mg | INTRAMUSCULAR | Status: DC | PRN

## 2018-03-11 MED ORDER — GLYCOPYRROLATE 0.2 MG/ML IJ SOLN: 0.2000 mg | INTRAMUSCULAR | Status: AC | PRN

## 2018-03-11 NOTE — Progress Notes (Signed)
Cardiologist Dr. Meda Coffee asked about why didn't send UA yesterday. Explained Dr. Meda Coffee patient's daughter refused to obtain urine by in-out cath. Discussed with Dr. Meda Coffee and need to put in the foley cath. Dr. Meda Coffee will order for insert foley cath. Explained patient why put in the foley cath. Patient seemed understanding. Inserted F-cath and talked with Palliative team regarding this matter and agreed to put in the foley cath. Pt has more pain continue to change under pad and there are moisture related skin damage. Pt has allergy for latex for rash, but there is no rash on the site. Continue to monitor it. HS [employer]

## 2018-03-11 NOTE — Clinical Social Work Note (Signed)
CSW facilitated patient discharge including contacting patient family and facility to confirm patient discharge plans. Clinical information faxed to facility and family agreeable with plan. CSW arranged ambulance transport via [facility] to [facility]. RN to call report prior to discharge ([phone]).  CSW will sign off for now as social work intervention is no longer needed. Please consult us again if new needs arise.  Marva Hendryx, CSW [phone]  

## 2018-03-11 NOTE — Progress Notes (Signed)
[place] 2C - 17 [facility] [place] [facility]) [place] RN Visit at 1400  Received request from [place] Scrape, [place] for family interest in [facility] with request for transfer today. Chart reviewed and report received from Mariana Kaufman, NP with PMT.  Met with daughter Myna Hidalgo to confirm interest and explain services. Family agreeable to transfer today. Judson Roch, [place] aware.   Registration paper work completed.   Dr. Veronia Beets to assume care per family request.   Please fax discharge summary to [phone].  RN please call report to [phone].  Please arrange for transport for patient to arrive as early in day as possible.  Please call with any hospice related questions or concerns.  Thank you, Margaretmary Eddy, RN, [facility] Liaison [phone]  [place] are on AMION.

## 2018-03-11 NOTE — Progress Notes (Signed)
Patient's daughter took patient's belongings. Administered pain medication before removed PIV access. Foley is in placed. [facility] picked patient to [facility]. HS [employer]

## 2018-03-11 NOTE — Care Management Important Message (Signed)
Important Message  Patient Details  Name: Cassidy [name] MRN: [id number] Date of Birth: [birth year]   Medicare Important Message Given:  Yes    Engelbert Sevin P [name] 03/11/2018, 2:53 PM

## 2018-03-11 NOTE — Clinical Social Work Note (Addendum)
Cassidy Eddy, RN with [facility] will meet with daughter at 2:00 pm to complete paperwork.  Cassidy [name], [place] [phone]  12:14 pm DNR on chart to be signed. CSW paged MD to notify.  Cassidy [name], [place]

## 2018-03-11 NOTE — Progress Notes (Signed)
Daily Progress Note   Patient Name: Cassidy [name]       Date: 03/11/2018 DOB: 02/02/1927  Age: 84 y.o. MRN#: [id number] Attending Physician: Buford Dresser, * Primary Care Physician: [name], MD Admit Date: 03/03/2018  Reason for Consultation/Follow-up: Establishing goals of care  Subjective: Received call last evening patient in great amount of pain and not taking oral pain medication. Changed to IV medication- patient more comfortable with IV med.  Spoke with patient's daughter this morning- noted her request for UA- she stated that after discussing with her son last evening (who is in nurse anesthetist school at [employer]) that she has changed her mind and no longer needs the UA completed. She understands that patient will be kept comfortable with pain medication. She expressed that she doesn't want patient "overmedicated". We discussed that patient will only be given pain medicine if she looks as though she is in pain. I did note to her that the medicine may make her sleepy, and that as she progresses through dying she will become less and less responsive.  Gave Myna Hidalgo great amount of emotional support, and ensured she understood the Hospice philosophy and [place] of residential Hospice. [place] liaison to continue arrangements for BP bed.  Patient resting comfortably this morning.  Foley placed this morning for comfort due to the great amount of pain it causes patient to turn for cleaning- per RN there is no vaginal rash or irritation. I believe her pain is coming more from her hip/back from her fall.     Review of Systems  Unable to perform ROS: Dementia    Length of Stay: 7  Current Medications: Scheduled Meds:  . oxybutynin  10 mg Oral Q breakfast  .  sodium chloride flush  3 mL Intravenous Q12H  . sodium chloride flush  3 mL Intravenous Q12H    Continuous Infusions: . sodium chloride    . sodium chloride      PRN Meds: sodium chloride, sodium chloride, acetaminophen, albuterol, amitriptyline, HYDROmorphone (DILAUDID) injection, LORazepam, nystatin, ondansetron (ZOFRAN) IV, sodium chloride flush, sodium chloride flush, sorbitol  Physical Exam  Constitutional: She appears well-developed and well-nourished. No distress.  Cardiovascular: Normal rate and regular rhythm.  Pulmonary/Chest: Effort normal.  Neurological:  Lethargic- does not arouse to voice  Nursing note and  vitals reviewed.           Vital Signs: BP (!) 114/39 (BP Location: Right Wrist)   Pulse 76   Temp 99.1 F (37.3 C) (Oral)   Resp (!) 24   Ht 4\' 11"  (1.499 m)   Wt 87.9 kg (193 lb 12.6 oz)   SpO2 97%   BMI 39.14 kg/m  SpO2: SpO2: 97 % O2 Device: O2 Device: Nasal Cannula O2 Flow Rate: O2 Flow Rate (L/min): 4 L/min  Intake/output summary: No intake or output data in the 24 hours ending 03/11/18 1236 LBM: Last BM Date: 03/06/18 Baseline Weight: Weight: 85.2 kg (187 lb 13.3 oz) Most recent weight: Weight: 87.9 kg (193 lb 12.6 oz)       Palliative Assessment/Data: PPS: 20%   Flowsheet Rows     Most Recent Value  Intake Tab  Referral Department  Hospitalist  Unit at Time of Referral  Intermediate Care Unit  Palliative Care Primary Diagnosis  Cardiac  Date Notified  03/09/18  Palliative Care Type  New Palliative care  Reason for referral  Clarify Goals of Care  Date of Admission  03/03/18  Date first seen by Palliative Care  03/09/18  # of days Palliative referral response time  0 Day(s)  # of days IP prior to Palliative referral  6  Clinical Assessment  Psychosocial & Spiritual Assessment  Palliative Care Outcomes      Patient Active Problem List   Diagnosis Date Noted  . Advance care planning   . Goals of care, counseling/discussion   .  Palliative care by specialist   . CKD (chronic kidney disease), stage III ([place]) 03/03/2018  . Lower extremity edema 03/03/2018  . Atypical chest pain 02/19/2018  . PUD (peptic ulcer disease) 02/19/2018  . Sepsis ([place]) 07/22/2016  . UTI (urinary tract infection) 07/22/2016  . Duodenal ulcer 11/07/2013  . Gastric ulcer with hemorrhage 11/07/2013  . Upper GI bleed 11/06/2013  . Unspecified constipation 09/19/2013  . Family history of malignant neoplasm of gastrointestinal tract 09/19/2013  . Preop cardiovascular exam 09/11/2011  . Coronary artery disease involving native coronary artery of native heart with angina pectoris ([place])   . Permanent atrial fibrillation ([place])   . Essential hypertension   . Diverticulosis   . Colon polyp   . Severe aortic stenosis   . Ascending aorta dilatation (H[facility])   . Asthma   . Obesity   . Dyslipidemia   . Ejection fraction   . Diabetes mellitus type 2 in obese ([place])   . Falling episodes     Palliative Care Assessment & Plan   Patient Profile: 84 y.o.femalewith past medical history of CAD, HTN, DMT2, HLD, GERD, CKD, dementiaadmitted on7/3/2019with recurrent episode of chest pain.Workup reveals severe aortic stenosis. She had a cath additionally showing severe RCA disease with occlusion. She has declined TAVR. She fell during admission and has not ambulated or been able to participate with PT. Prior to admission she was ambulating in the home only from the bed to meals and then back to bed. Palliative medicine consulted for [place].  Assessment/Recommendations/Plan   Continue comfort measures  D/C PO medications  Hopeful for transfer to [facility] today  Foley catheter for comfort  Goals of Care and Additional Recommendations:  Limitations on Scope of Treatment: Full Comfort Care  Code Status:  DNR  Prognosis:   < 2 weeks d/t no po intake, no ambulation, severe aortic stenosis with RCA occlusion, angina at rest, transition to  comfort care  only  Discharge Planning:  Hospice facility pending bed availability  Care plan was discussed with patient's daughter, RN Devona Konig, and Margaretmary Eddy, RN with [facility]   Thank you for allowing the Palliative Medicine Team to assist in the care of this patient.   Time In: 1130 Time Out: 1245 Total Time 75 mins Prolonged Time Billed yes      Greater than 50%  of this time was spent counseling and coordinating care related to the above assessment and plan.  Mariana Kaufman, AGNP-C Palliative Medicine   Please contact Palliative Medicine Team phone at [phone] for questions and concerns.

## 2018-03-11 NOTE — Progress Notes (Addendum)
[facility]  Received request from [place] for family interest in [facility]. Chart reviewed for eligibility and visit attempted. No family present. Spoke with daughter by phone who advised she needs to know if patient has been tested for UTI. She said she was not willing to talk further about possible [employer] transfer until she heard from RN or MD re urinalysis. Attempted to explain [employer] of care. Daughter explained she is not able to come to the hospital this afternoon due to she is caregiver for her father. Updated CSW. Appreciate assistance from bedside RN. [facility] hospital liaison will follow up regarding potential transfer to [facility] on 03/11/18.   Thank you,  Erling Conte, LCSW [phone]

## 2018-03-31 ENCOUNTER — Ambulatory Visit: Payer: Medicare Other | Admitting: Physician Assistant

## 2018-04-01 DEATH — deceased

## 2018-04-06 ENCOUNTER — Encounter: Payer: Self-pay | Admitting: Thoracic Surgery (Cardiothoracic Vascular Surgery)

## 2018-11-10 IMAGING — DX DG SHOULDER 2+V*R*
1 series · 2 of 2 positions shown · non-contrast
Comparison: Chest x-ray dated 03/03/2018. Plain film of the RIGHT
shoulder dated 03/18/2007.

CLINICAL DATA: Pt complains of right shoulder and right hip pain
since falling out of her hospital chair this AM

EXAM:
RIGHT SHOULDER - 2+ VIEW

[Series 1: shoulder · 0.14mm/px · 2 of 2 slices shown]
[im 1/2]
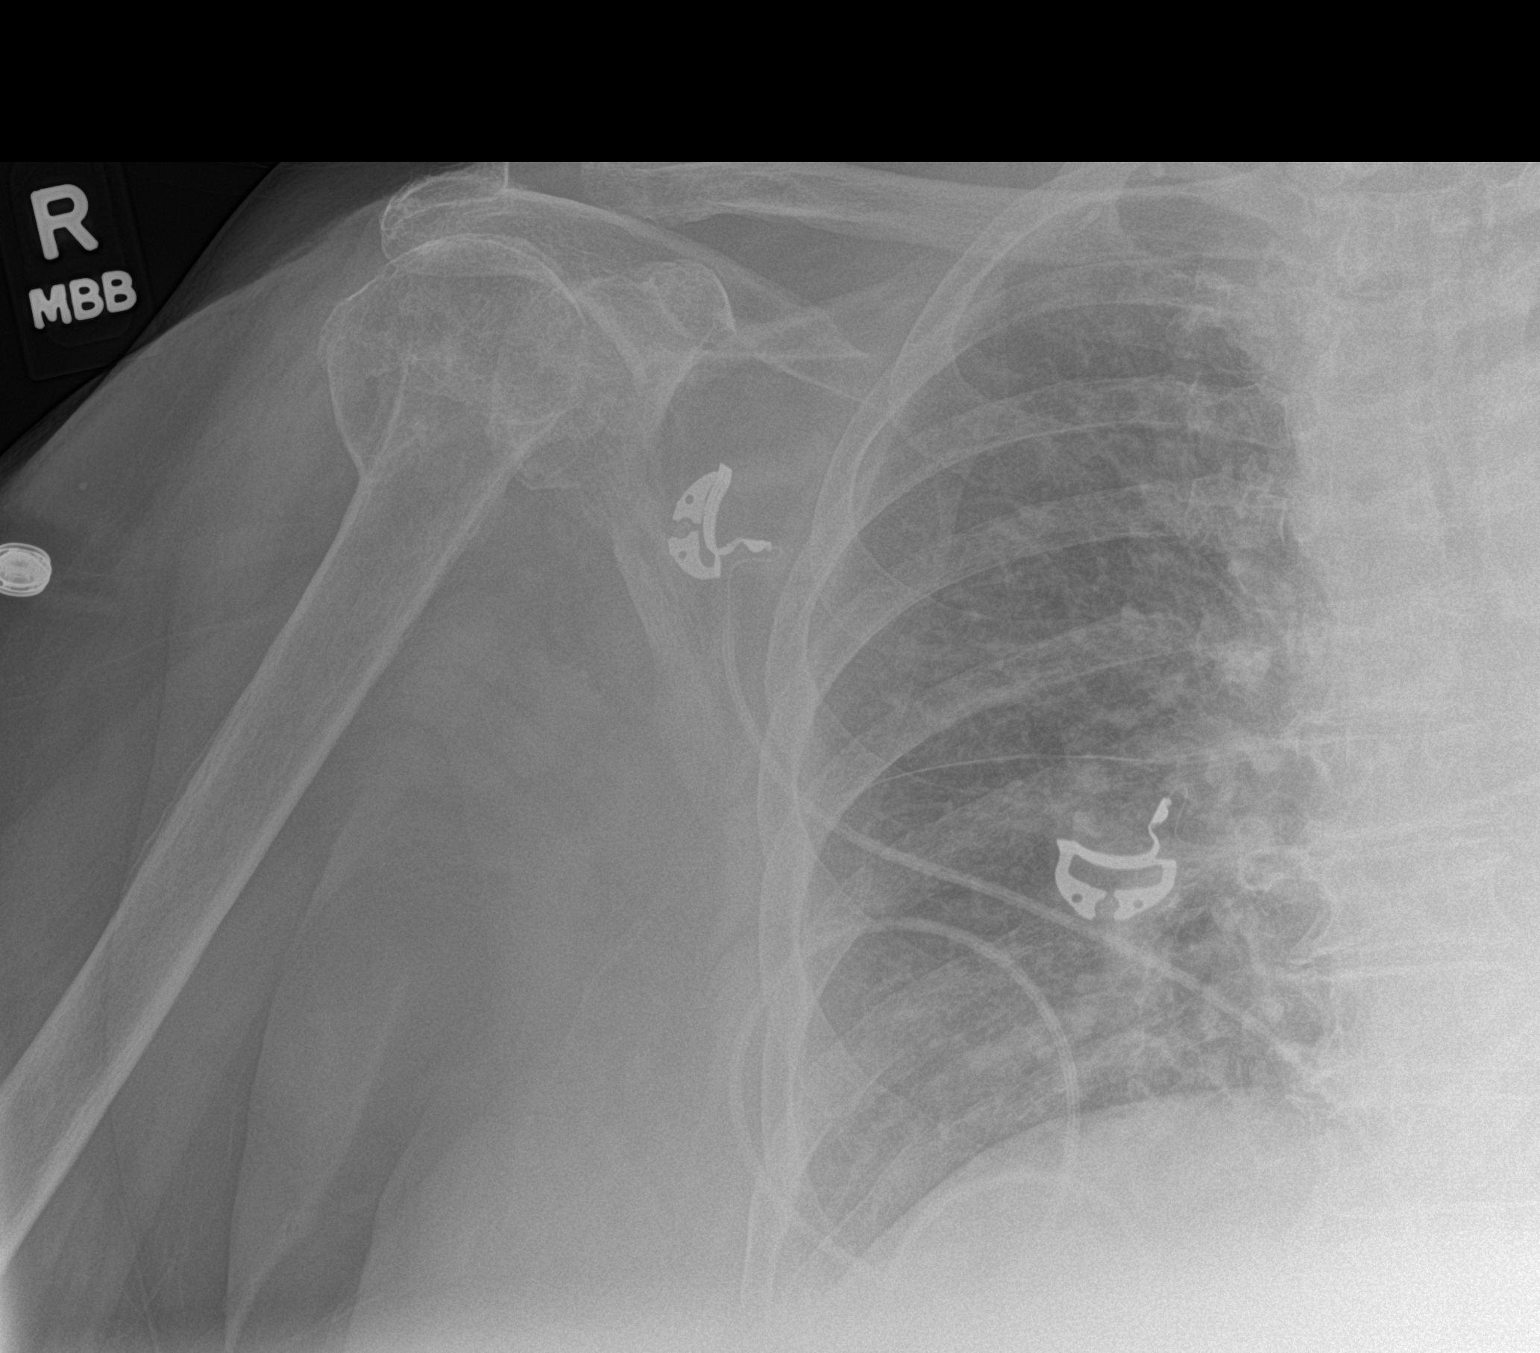
[im 2/2]
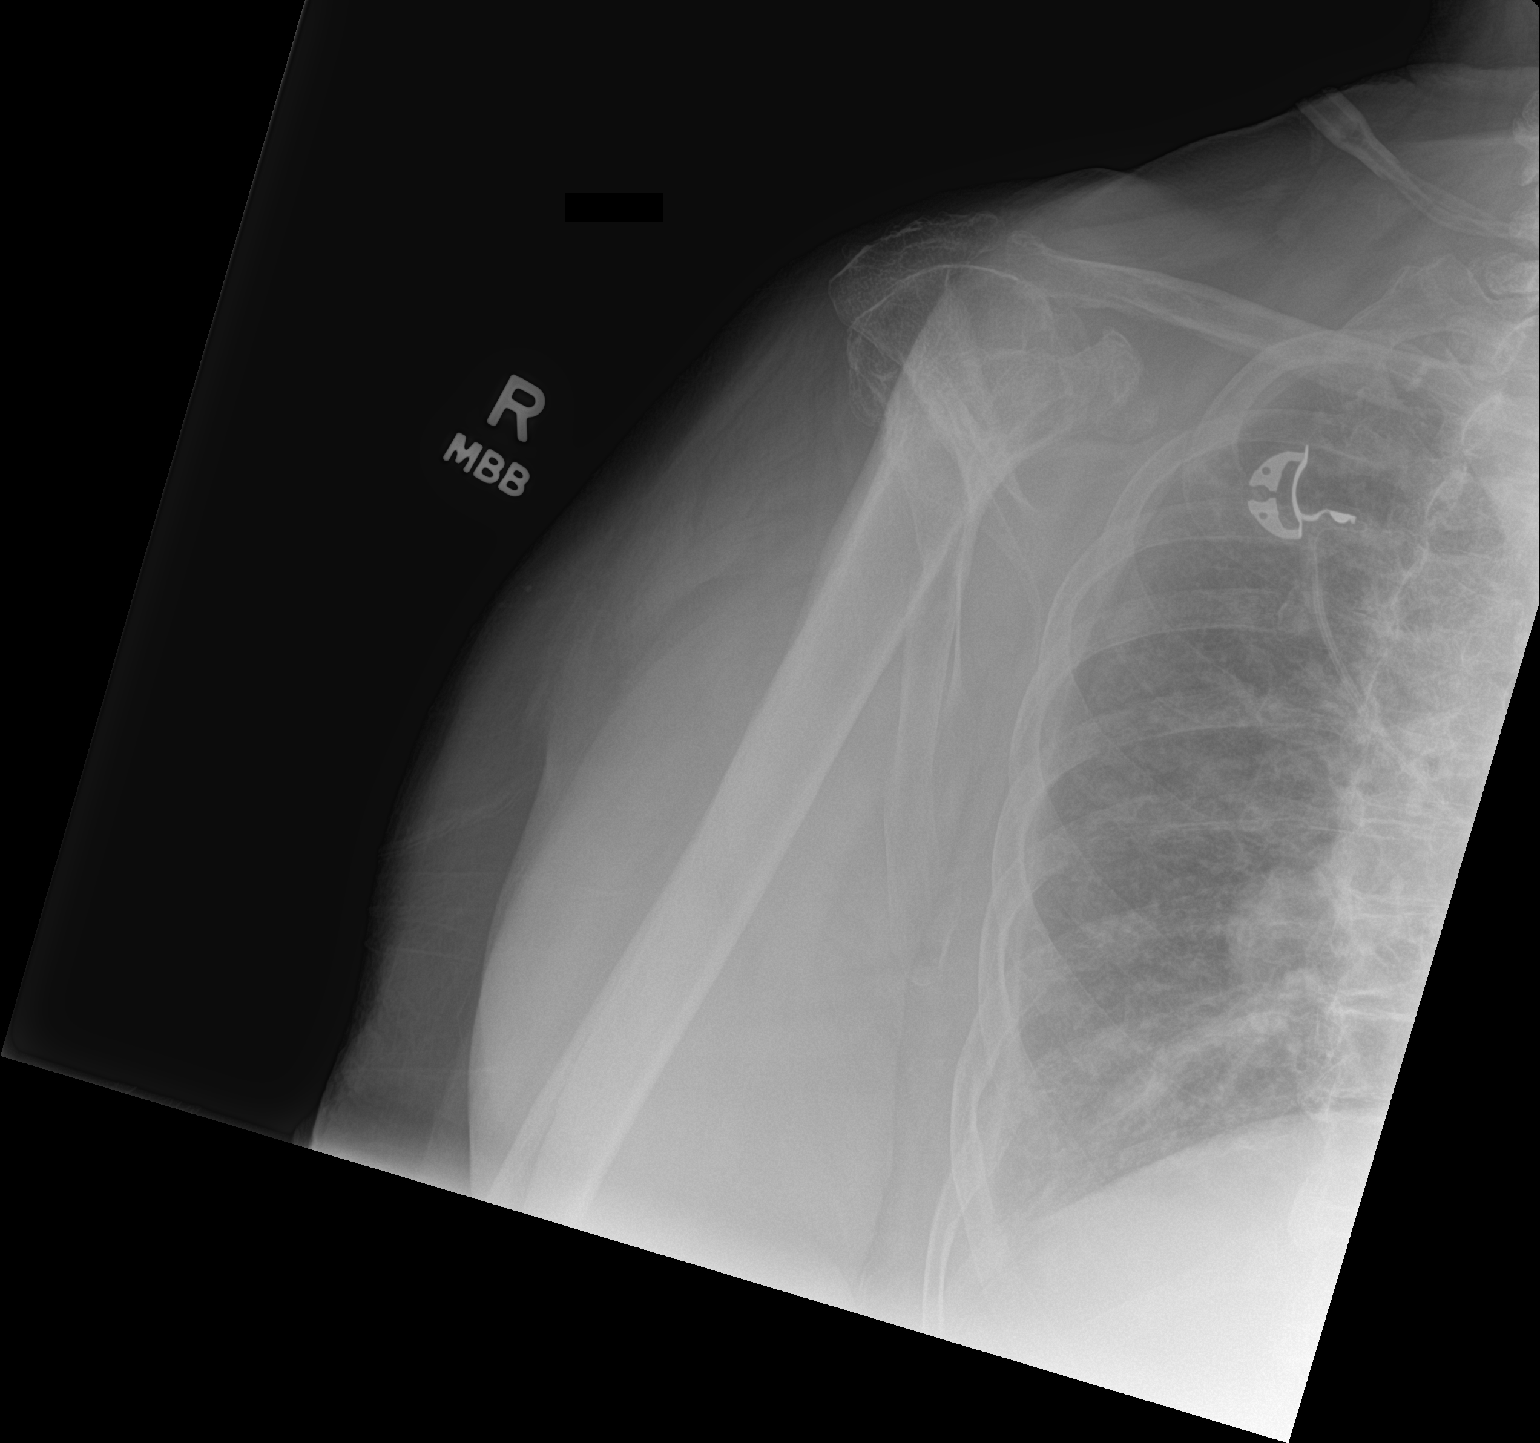

[2 of 2 positions shown; findings below may reference images not displayed]

FINDINGS: Old healed fracture of the RIGHT humeral neck. No acute fracture or
dislocation. Stable diastasis at the acromioclavicular joint space.
Soft tissues about the RIGHT shoulder are unremarkable.
IMPRESSION: No acute findings.  Old healed fracture of the RIGHT humeral neck.

## 2019-02-03 ENCOUNTER — Encounter (INDEPENDENT_AMBULATORY_CARE_PROVIDER_SITE_OTHER): Payer: Medicare Other | Admitting: Ophthalmology
